# Patient Record
Sex: Female | Born: 1990
Health system: Southern US, Community
[De-identification: ages and names within clinical notes are randomized; demographics above are authoritative.]

## PROBLEM LIST (undated history)

## (undated) DIAGNOSIS — IMO0001 Reserved for inherently not codable concepts without codable children: Secondary | ICD-10-CM

## (undated) DIAGNOSIS — K219 Gastro-esophageal reflux disease without esophagitis: Secondary | ICD-10-CM

## (undated) HISTORY — DX: Gastro-esophageal reflux disease without esophagitis: K21.9

## (undated) HISTORY — DX: Reserved for inherently not codable concepts without codable children: IMO0001

---
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
## 2012-05-16 ENCOUNTER — Encounter: Payer: Self-pay | Admitting: Family Medicine

## 2012-05-16 ENCOUNTER — Ambulatory Visit (INDEPENDENT_AMBULATORY_CARE_PROVIDER_SITE_OTHER): Payer: Federal, State, Local not specified - PPO | Admitting: Family Medicine

## 2012-05-16 VITALS — BP 94/70 | HR 88 | Temp 98.8°F | Ht 62.5 in | Wt 147.0 lb

## 2012-05-16 DIAGNOSIS — R209 Unspecified disturbances of skin sensation: Secondary | ICD-10-CM

## 2012-05-16 DIAGNOSIS — R202 Paresthesia of skin: Secondary | ICD-10-CM

## 2012-05-16 NOTE — Progress Notes (Signed)
L arm sx.  Started about 1 month ago, initially intermittent.  Tingling in the arm, occ in the entire arm, or the forearm, or the upper arm in isolation.  Would last a few minutes.  Has been happening more often recently.  Noted a 'knot' on the L forearm yesterday, resolved today.  No weakness. No rash.  L handed.  No change in usual activities.  Out of school for summer break, but no clear trigger known for sx. "It can feel like it went to sleep."  Pins and needles sensation.    No L leg sx.  No facial sx. No speech changes.  Hands don't feel clumsy.  No FCNAVD.  Feels well o/w.  No neck pain.   Meds, vitals, and allergies reviewed.   ROS: See HPI.  Otherwise, noncontributory.  Nad ncat Neck supple, spurling neg B Normal rom at the shoulders, elbows, wrists CN 2-12 wnl B, S/S/DTR wnl x4 phalen and tinel neg No rash Normal inspection of the L arm and hand

## 2012-05-16 NOTE — Patient Instructions (Addendum)
Get an over the counter 'cock up' wrist brace and wear it at work and at night.  This should get better.  When it does, gradually wear the brace less.  If not better, notify us.

## 2012-05-16 NOTE — Assessment & Plan Note (Signed)
L handed.  Possible carpal tunnel symptoms, neg phalen and tinel could be due to mild/early symptoms.  Nontoxic, no alarming sx on exam.  Would use cock up brace prn and follow clinically.  D/w pt.  She agrees.  Call back or f/u prn.

## 2012-07-02 ENCOUNTER — Telehealth: Payer: Self-pay | Admitting: Family Medicine

## 2012-07-02 NOTE — Telephone Encounter (Signed)
Patient needs a wellness form to be filled out for foster care.  Patient needs to be seen before the end of September to have the form filled out and your next available for a cpx is in January.  Can patient be seen sooner?

## 2012-07-03 NOTE — Telephone Encounter (Signed)
yes

## 2012-07-17 ENCOUNTER — Encounter: Payer: Self-pay | Admitting: Family Medicine

## 2012-07-17 ENCOUNTER — Ambulatory Visit (INDEPENDENT_AMBULATORY_CARE_PROVIDER_SITE_OTHER): Payer: Federal, State, Local not specified - PPO | Admitting: Family Medicine

## 2012-07-17 VITALS — BP 119/75 | HR 93 | Temp 98.4°F | Ht 62.0 in | Wt 152.0 lb

## 2012-07-17 DIAGNOSIS — Z01419 Encounter for gynecological examination (general) (routine) without abnormal findings: Secondary | ICD-10-CM

## 2012-07-17 DIAGNOSIS — Z23 Encounter for immunization: Secondary | ICD-10-CM

## 2012-07-17 DIAGNOSIS — Z113 Encounter for screening for infections with a predominantly sexual mode of transmission: Secondary | ICD-10-CM

## 2012-07-17 DIAGNOSIS — N920 Excessive and frequent menstruation with regular cycle: Secondary | ICD-10-CM

## 2012-07-17 DIAGNOSIS — Z0000 Encounter for general adult medical examination without abnormal findings: Secondary | ICD-10-CM

## 2012-07-17 LAB — CBC WITH DIFFERENTIAL/PLATELET
Basophils Relative: 0.7 % (ref 0.0–3.0)
Eosinophils Relative: 3.8 % (ref 0.0–5.0)
Lymphocytes Relative: 47.6 % — ABNORMAL HIGH (ref 12.0–46.0)
MCV: 77.9 fl — ABNORMAL LOW (ref 78.0–100.0)
Monocytes Relative: 8.2 % (ref 3.0–12.0)
Neutrophils Relative %: 39.7 % — ABNORMAL LOW (ref 43.0–77.0)
RBC: 4.3 Mil/uL (ref 3.87–5.11)
WBC: 4.9 10*3/uL (ref 4.5–10.5)

## 2012-07-17 MED ORDER — OMEPRAZOLE 20 MG PO CPDR: 20.0000 mg | DELAYED_RELEASE_CAPSULE | Freq: Every day | ORAL | Status: DC

## 2012-07-17 NOTE — Progress Notes (Signed)
Subjective:    Patient ID: Rebecca Rollins, female    DOB: December 24, 1990, 21 y.o.   MRN: [id number]  HPI  The patient is here for annual wellness exam and preventative care.    Doing well overall.  GERD:  She has been off omeprazole because was out. She states only having mild symptoms depending on what she eats.   Review of Systems  Constitutional: Negative for fever, fatigue and unexpected weight change.  HENT: Negative for ear pain, congestion, sore throat, sneezing, trouble swallowing and sinus pressure.   Eyes: Negative for pain and itching.  Respiratory: Negative for cough, shortness of breath and wheezing.   Cardiovascular: Negative for chest pain, palpitations and leg swelling.  Gastrointestinal: Negative for nausea, abdominal pain, diarrhea, constipation and blood in stool.  Genitourinary: Positive for menstrual problem. Negative for dysuria, hematuria, vaginal discharge and difficulty urinating.       Cramping and fairly heavy flows  Skin: Negative for rash.  Neurological: Negative for syncope, weakness, light-headedness, numbness and headaches.  Psychiatric/Behavioral: Negative for confusion and dysphoric mood. The patient is not nervous/anxious.        Objective:   Physical Exam  Constitutional: Vital signs are normal. She appears well-developed and well-nourished. She is cooperative.  Non-toxic appearance. She does not appear ill. No distress.  HENT:  Head: Normocephalic.  Right Ear: Hearing, tympanic membrane, external ear and ear canal normal.  Left Ear: Hearing, tympanic membrane, external ear and ear canal normal.  Nose: Nose normal.  Eyes: Conjunctivae normal, EOM and lids are normal. Pupils are equal, round, and reactive to light. No foreign bodies found.  Neck: Trachea normal and normal range of motion. Neck supple. Carotid bruit is not present. No mass and no thyromegaly present.  Cardiovascular: Normal rate, regular rhythm, S1 normal, S2 normal, normal heart  sounds and intact distal pulses.  Exam reveals no gallop.   No murmur heard. Pulmonary/Chest: Effort normal and breath sounds normal. No respiratory distress. She has no wheezes. She has no rhonchi. She has no rales.  Abdominal: Soft. Normal appearance and bowel sounds are normal. She exhibits no distension, no fluid wave, no abdominal bruit and no mass. There is no hepatosplenomegaly. There is no tenderness. There is no rebound, no guarding and no CVA tenderness. No hernia.  Genitourinary: Vagina normal and uterus normal. No breast swelling, tenderness, discharge or bleeding. Pelvic exam was performed with patient prone. There is no rash, tenderness or lesion on the right labia. There is no rash, tenderness or lesion on the left labia. Uterus is not enlarged and not tender. Cervix exhibits no motion tenderness, no discharge and no friability. Right adnexum displays no mass, no tenderness and no fullness. Left adnexum displays no mass, no tenderness and no fullness.       No pap  GC/Chlam probe.  Lymphadenopathy:    She has no cervical adenopathy.    She has no axillary adenopathy.  Neurological: She is alert. She has normal strength. No cranial nerve deficit or sensory deficit.  Skin: Skin is warm, dry and intact. No rash noted.  Psychiatric: Her speech is normal and behavior is normal. Judgment normal. Her mood appears not anxious. Cognition and memory are normal. She does not exhibit a depressed mood.          Assessment & Plan:  The patient's preventative maintenance and recommended screening tests for an annual wellness exam were reviewed in full today. Brought up to date unless services declined.  Counselled on the  importance of diet, exercise, and its role in overall health and mortality. The patient's FH and SH was reviewed, including their home life, tobacco status, and drug and alcohol status.   Vaccines: flu today, will consider gardasil. Uptodate with TD  Will return for  ppd. STD testing today, no pap indicated.

## 2012-07-17 NOTE — Patient Instructions (Addendum)
Avoid reflux triggers. Restart omeprazole daily if symptoms recur more than 2-3 times a week.  Stop at lab on your way out.. We will call with the results.  Make nurse visit appt for PPD. Look into Gardasil vaccine coverage.. Can return for this in next week as well.  Flu shot given today.

## 2012-07-18 LAB — GC/CHLAMYDIA PROBE AMP, GENITAL
Chlamydia, DNA Probe: NEGATIVE
GC Probe Amp, Genital: NEGATIVE

## 2012-07-18 LAB — HIV ANTIBODY (ROUTINE TESTING W REFLEX): HIV: NONREACTIVE

## 2012-11-11 ENCOUNTER — Ambulatory Visit (INDEPENDENT_AMBULATORY_CARE_PROVIDER_SITE_OTHER): Payer: Federal, State, Local not specified - PPO | Admitting: *Deleted

## 2012-11-11 DIAGNOSIS — Z111 Encounter for screening for respiratory tuberculosis: Secondary | ICD-10-CM

## 2012-11-13 LAB — TB SKIN TEST: TB Skin Test: NEGATIVE

## 2013-01-16 ENCOUNTER — Ambulatory Visit: Payer: Federal, State, Local not specified - PPO | Admitting: Family Medicine

## 2013-01-22 ENCOUNTER — Telehealth: Payer: Self-pay

## 2013-01-22 ENCOUNTER — Encounter: Payer: Self-pay | Admitting: *Deleted

## 2013-01-22 ENCOUNTER — Ambulatory Visit (INDEPENDENT_AMBULATORY_CARE_PROVIDER_SITE_OTHER): Payer: Federal, State, Local not specified - PPO | Admitting: Family Medicine

## 2013-01-22 ENCOUNTER — Encounter: Payer: Self-pay | Admitting: Family Medicine

## 2013-01-22 VITALS — BP 100/60 | HR 96 | Temp 103.1°F | Ht 62.0 in | Wt 156.5 lb

## 2013-01-22 DIAGNOSIS — J020 Streptococcal pharyngitis: Secondary | ICD-10-CM

## 2013-01-22 DIAGNOSIS — J029 Acute pharyngitis, unspecified: Secondary | ICD-10-CM

## 2013-01-22 LAB — POCT RAPID STREP A (OFFICE): Rapid Strep A Screen: POSITIVE — AB

## 2013-01-22 MED ORDER — AZITHROMYCIN 250 MG PO TABS: ORAL_TABLET | ORAL | Status: DC

## 2013-01-22 NOTE — Progress Notes (Signed)
Nature conservation officer at [facility] [address] [place] [address] Phone: [phone] Fax: [phone]  Date:  01/22/2013   Name:  Rebecca Rollins   DOB:  08-17-1991   MRN:  [id number] Gender: female Age: 22 y.o.  Primary Physician:  Kerby Nora, MD  Evaluating MD: Hannah Beat, MD   Chief Complaint: Sore Throat   History of Present Illness:  Rebecca Rollins is a 22 y.o. pleasant patient who presents with the following:  Strep + test  Sore throat for several days, now severe tiredness, sore throat, headache, sore neck. Does not want to eat or drink. Aches and feels pretty terrible overall.  Also have a question about R side of face if swollen?  Patient Active Problem List  Diagnosis  . GERD  . DYSMENORRHEA  . Vaginitis due to Candida  . Bacterial vaginosis  . Paresthesia    Past Medical History  Diagnosis Date  . Reflux     Past Surgical History  Procedure Laterality Date  . Mouth surgery  2011    History   Social History  . Marital Status: Single    Spouse Name: N/A    Number of Children: N/A  . Years of Education: N/A   Occupational History  . Not on file.   Social History Main Topics  . Smoking status: Never Smoker   . Smokeless tobacco: Never Used  . Alcohol Use: Yes     Comment: occasinally, 2-3 drinks once every 6 months  . Drug Use: No  . Sexually Active: Yes    Birth Control/ Protection: Condom   Other Topics Concern  . Not on file   Social History Narrative   11th grade at [employer]   As and Bs   Plans on college   Diet: some fruits and veggies   No sports   Works at [employer]   Exercise: only in gym   [employer] and phone   Sexually active...first at 28    Family History  Problem Relation Age of Onset  . Hypertension Mother   . Anemia Mother   . Diabetes Maternal Uncle   . Hypertension Maternal Grandmother   . Hypertension Maternal Grandfather     Allergies  Allergen Reactions  . Penicillins    REACTION: ? Rash    Medication list has been reviewed and updated.  Outpatient Prescriptions Prior to Visit  Medication Sig Dispense Refill  . omeprazole (PRILOSEC) 20 MG capsule Take 1 capsule (20 mg total) by mouth daily.  30 capsule  3   No facility-administered medications prior to visit.    Review of Systems:  ROS: GEN: Acute illness details above GI: Tolerating PO intake, decreased GU: maintaining adequate hydration and urination Pulm: No SOB Interactive and getting along well at home.  Otherwise, ROS is as per the HPI.   Physical Examination: BP 100/60  Pulse 96  Temp(Src) 103.1 F (39.5 C) (Oral)  Ht 5\' 2"  (1.575 m)  Wt 156 lb 8 oz (70.988 kg)  BMI 28.62 kg/m2  Ideal Body Weight: Weight in (lb) to have BMI = 25: 136.4   Gen: WDWN, NAD; A & O x3, cooperative. Pleasant.Globally Non-toxic HEENT: Normocephalic and atraumatic. Throat: swollen tonsills without exudate R TM clear, L TM - good landmarks, No fluid present. rhinnorhea. No frontal or maxillary sinus T. MMM NECK: Anterior cervical  LAD is present - TTP CV: RRR, No M/G/R, cap refill <2 sec PULM: Breathing comfortably in no respiratory distress. no  wheezing, crackles, rhonchi ABD: S,NT,ND,+BS. No HSM. No rebound. EXT: No c/c/e PSYCH: Friendly, good eye contact   Assessment and Plan: Streptococcal sore throat  Sore throat - Plan: POCT rapid strep A   Zpak in a PCN allergic patient   R side of face seems roughly same to me, maybe slight enlargement, but nothing palpable. Parotids and submental glands normal, no LAD. I tried to reassured them.   Results for orders placed in visit on 01/22/13  POCT RAPID STREP A (OFFICE)      Result Value Range   Rapid Strep A Screen Positive (*) Negative     Signed, Gatsby Chismar T. Bernard Slayden, MD 01/22/2013 3:56 PM

## 2013-01-22 NOTE — Telephone Encounter (Signed)
Pt's mother said 1 AM this morning pt had fever 103, took Ibuprofen; temp is down but pts throat is extremely sore and tonsils are swollen. Pt not having difficulty breathing or swallowing. Pt's mother thinks pt needs to be seen today. Scheduled appt with Dr Patsy Lager today at 3:45 pm. If pts condition changes or worsens pt's mother will call back.

## 2013-01-23 ENCOUNTER — Ambulatory Visit: Payer: Federal, State, Local not specified - PPO | Admitting: Family Medicine

## 2013-03-13 ENCOUNTER — Ambulatory Visit: Payer: Federal, State, Local not specified - PPO | Admitting: Family Medicine

## 2013-03-17 ENCOUNTER — Ambulatory Visit (INDEPENDENT_AMBULATORY_CARE_PROVIDER_SITE_OTHER): Payer: Federal, State, Local not specified - PPO | Admitting: Family Medicine

## 2013-03-17 ENCOUNTER — Encounter: Payer: Self-pay | Admitting: Family Medicine

## 2013-03-17 VITALS — BP 100/60 | HR 88 | Temp 98.3°F | Ht 62.0 in | Wt 161.1 lb

## 2013-03-17 DIAGNOSIS — R220 Localized swelling, mass and lump, head: Secondary | ICD-10-CM | POA: Insufficient documentation

## 2013-03-17 DIAGNOSIS — L700 Acne vulgaris: Secondary | ICD-10-CM | POA: Insufficient documentation

## 2013-03-17 DIAGNOSIS — L708 Other acne: Secondary | ICD-10-CM

## 2013-03-17 DIAGNOSIS — M26609 Unspecified temporomandibular joint disorder, unspecified side: Secondary | ICD-10-CM

## 2013-03-17 MED ORDER — TRETINOIN MICROSPHERE 0.1 % EX GEL: Freq: Every day | CUTANEOUS | Status: DC

## 2013-03-17 NOTE — Assessment & Plan Note (Signed)
No red flags. Nml thyroid, no enlarged lymph nodes. No oral issues seen. No cyst.  Appears consistent with larger masseter muscle on right.   Pt reassured.

## 2013-03-17 NOTE — Patient Instructions (Signed)
Area of jaw swelling is not a lymph node or the thyroid. Simply appears liek larger jaw muscle on that side from possible tooth grinding or uneven jaw use.  Trial of retin A cream for acne  Stop at front desk to set up referral to Dermatologist.

## 2013-03-17 NOTE — Assessment & Plan Note (Signed)
Refer to derm. Ttrial of retin A to help with acne and lighten dark spots.

## 2013-03-17 NOTE — Progress Notes (Signed)
  Subjective:    Patient ID: Rebecca Rollins, female    DOB: 1991-08-28, 22 y.o.   MRN: [id number]  HPI  22 year old female presents with a knot on right jaw-line... It has been there for years... Mother feels like it may be getting bigger in last year. No pain in area, has acne... Had root canal 2-3 years ago, no recent tooth infections. Dentist felt it was not worrisome. No other concerns... Good energy . No fever, no night sweats, no unexpected weight loss.  No tobacco use.  She wants a referral to dermatologist for acne and dark spots on face.   Review of Systems  Constitutional: Negative for fever and fatigue.  HENT: Negative for nosebleeds, congestion and sneezing.   Eyes: Negative for pain.  Respiratory: Negative for cough and shortness of breath.   Cardiovascular: Negative for chest pain.  Gastrointestinal: Negative for abdominal pain.  Neurological: Negative for headaches.       Objective:   Physical Exam  Constitutional: Vital signs are normal. She appears well-developed and well-nourished. She is cooperative.  Non-toxic appearance. She does not appear ill. No distress.  HENT:  Head: Normocephalic.  Right Ear: Hearing, tympanic membrane, external ear and ear canal normal. Tympanic membrane is not erythematous, not retracted and not bulging.  Left Ear: Hearing, tympanic membrane, external ear and ear canal normal. Tympanic membrane is not erythematous, not retracted and not bulging.  Nose: No mucosal edema or rhinorrhea. Right sinus exhibits no maxillary sinus tenderness and no frontal sinus tenderness. Left sinus exhibits no maxillary sinus tenderness and no frontal sinus tenderness.  Mouth/Throat: Uvula is midline, oropharynx is clear and moist and mucous membranes are normal. No oral lesions. Normal dentition. No dental abscesses, edematous or dental caries. No oropharyngeal exudate, posterior oropharyngeal edema, posterior oropharyngeal erythema or tonsillar abscesses.   Right masseter muscle enlarged.. Non tender  o parotid swelling  Eyes: Conjunctivae, EOM and lids are normal. Pupils are equal, round, and reactive to light. No foreign bodies found.  Neck: Trachea normal and normal range of motion. Neck supple. Carotid bruit is not present. No mass and no thyromegaly present.  Cardiovascular: Normal rate, regular rhythm, S1 normal, S2 normal, normal heart sounds, intact distal pulses and normal pulses.  Exam reveals no gallop and no friction rub.   No murmur heard. Pulmonary/Chest: Effort normal and breath sounds normal. Not tachypneic. No respiratory distress. She has no decreased breath sounds. She has no wheezes. She has no rhonchi. She has no rales.  Abdominal: Soft. Normal appearance and bowel sounds are normal. There is no tenderness.  Neurological: She is alert.  Skin: Skin is warm, dry and intact. No rash noted.  Erythematous pustules and closed comedones and dark spots on cheeks and forehead.  Psychiatric: Her speech is normal and behavior is normal. Judgment and thought content normal. Her mood appears not anxious. Cognition and memory are normal. She does not exhibit a depressed mood.          Assessment & Plan:

## 2013-05-21 ENCOUNTER — Ambulatory Visit: Payer: Federal, State, Local not specified - PPO | Admitting: Family Medicine

## 2013-05-21 DIAGNOSIS — Z0289 Encounter for other administrative examinations: Secondary | ICD-10-CM

## 2013-09-21 ENCOUNTER — Encounter: Payer: Self-pay | Admitting: Family Medicine

## 2013-09-21 ENCOUNTER — Ambulatory Visit (INDEPENDENT_AMBULATORY_CARE_PROVIDER_SITE_OTHER): Payer: Federal, State, Local not specified - PPO | Admitting: Family Medicine

## 2013-09-21 VITALS — BP 100/70 | HR 84 | Temp 98.7°F | Ht 62.0 in | Wt 159.5 lb

## 2013-09-21 DIAGNOSIS — Z23 Encounter for immunization: Secondary | ICD-10-CM

## 2013-09-21 DIAGNOSIS — Z0000 Encounter for general adult medical examination without abnormal findings: Secondary | ICD-10-CM

## 2013-09-21 DIAGNOSIS — Z113 Encounter for screening for infections with a predominantly sexual mode of transmission: Secondary | ICD-10-CM

## 2013-09-21 DIAGNOSIS — Z309 Encounter for contraceptive management, unspecified: Secondary | ICD-10-CM

## 2013-09-21 DIAGNOSIS — Z111 Encounter for screening for respiratory tuberculosis: Secondary | ICD-10-CM

## 2013-09-21 NOTE — Progress Notes (Signed)
Pre-visit discussion using our clinic review tool. No additional management support is needed unless otherwise documented below in the visit note.  

## 2013-09-21 NOTE — Patient Instructions (Addendum)
Return in 2 weeks for repeat Urine preg and initiation of Depo injections. Stop at lab on way out. We will call with results in few days. Work on [employer] and regular exercise.

## 2013-09-21 NOTE — Progress Notes (Signed)
HPI  The patient is here for annual wellness exam and preventative care.  Doing well overall.  Diet: poor diet, was doing better over the summer. Exercise: none Wt Readings from Last 3 Encounters:  09/21/13 159 lb 8 oz (72.349 kg)  03/17/13 161 lb 1.9 oz (73.084 kg)  01/22/13 156 lb 8 oz (70.988 kg)     GERD:  She states only having mild symptoms depending on what she eats.  Not hapenning often.  She is interested in Depo shot for contraception.  LMP 08/26/2013 She will move ahead with Upreg today to prepare if she decides to start depo. Review of Systems  Constitutional: Negative for fever, fatigue and unexpected weight change.  HENT: Negative for ear pain, congestion, sore throat, sneezing, trouble swallowing and sinus pressure.  Eyes: Negative for pain and itching.  Respiratory: Negative for cough, shortness of breath and wheezing.  Cardiovascular: Negative for chest pain, palpitations and leg swelling.  Gastrointestinal: Negative for nausea, abdominal pain, diarrhea, constipation and blood in stool.  Genitourinary: Positive for menstrual problem. Negative for dysuria, hematuria, vaginal discharge and difficulty urinating.  Cramping and fairly heavy flows  Skin: Negative for rash.  Neurological: Negative for syncope, weakness, light-headedness, numbness and headaches.  Psychiatric/Behavioral: Negative for confusion and dysphoric mood. The patient is not nervous/anxious.  Objective:   Physical Exam  Constitutional: Vital signs are normal. She appears well-developed and well-nourished. She is cooperative. Non-toxic appearance. She does not appear ill. No distress.  HENT:  Head: Normocephalic.  Right Ear: Hearing, tympanic membrane, external ear and ear canal normal.  Left Ear: Hearing, tympanic membrane, external ear and ear canal normal.  Nose: Nose normal.  Eyes: Conjunctivae normal, EOM and lids are normal. Pupils are equal, round, and reactive to light. No foreign bodies  found.  Neck: Trachea normal and normal range of motion. Neck supple. Carotid bruit is not present. No mass and no thyromegaly present.  Cardiovascular: Normal rate, regular rhythm, S1 normal, S2 normal, normal heart sounds and intact distal pulses. Exam reveals no gallop.  No murmur heard.  Pulmonary/Chest: Effort normal and breath sounds normal. No respiratory distress. She has no wheezes. She has no rhonchi. She has no rales.  Abdominal: Soft. Normal appearance and bowel sounds are normal. She exhibits no distension, no fluid wave, no abdominal bruit and no mass. There is no hepatosplenomegaly. There is no tenderness. There is no rebound, no guarding and no CVA tenderness. No hernia.  Genitourinary: Vagina normal and uterus normal. No breast swelling, tenderness, discharge or bleeding. Pelvic exam was performed with patient prone. There is no rash, tenderness or lesion on the right labia. There is no rash, tenderness or lesion on the left labia. Uterus is not enlarged and not tender. Cervix exhibits no motion tenderness, no discharge and no friability. Right adnexum displays no mass, no tenderness and no fullness. Left adnexum displays no mass, no tenderness and no fullness.  No pap GC/Chlam probe.  Lymphadenopathy:  She has no cervical adenopathy.  She has no axillary adenopathy.  Neurological: She is alert. She has normal strength. No cranial nerve deficit or sensory deficit.  Skin: Skin is warm, dry and intact. No rash noted.  Psychiatric: Her speech is normal and behavior is normal. Judgment normal. Her mood appears not anxious. Cognition and memory are normal. She does not exhibit a depressed mood.  Assessment & Plan:   The patient's preventative maintenance and recommended screening tests for an annual wellness exam were reviewed in full today.  Brought up to date unless services declined.  Counselled on the importance of diet, exercise, and its role in overall health and mortality.  The  patient's FH and SH was reviewed, including their home life, tobacco status, and drug and alcohol status.   Vaccines: flu today, will consider gardasil. Uptodate with TD  STD testing today, no pap indicated.

## 2013-09-22 LAB — HEPATITIS PANEL, ACUTE
HCV Ab: NEGATIVE
Hep A IgM: NONREACTIVE
Hep B C IgM: NONREACTIVE
Hepatitis B Surface Ag: NEGATIVE

## 2013-09-22 LAB — HIV ANTIBODY (ROUTINE TESTING W REFLEX): HIV: NONREACTIVE

## 2013-11-20 ENCOUNTER — Ambulatory Visit (INDEPENDENT_AMBULATORY_CARE_PROVIDER_SITE_OTHER): Payer: Federal, State, Local not specified - PPO | Admitting: Family Medicine

## 2013-11-20 ENCOUNTER — Encounter: Payer: Self-pay | Admitting: Family Medicine

## 2013-11-20 VITALS — BP 110/60 | HR 100 | Temp 98.8°F | Ht 62.0 in | Wt 161.5 lb

## 2013-11-20 DIAGNOSIS — N920 Excessive and frequent menstruation with regular cycle: Secondary | ICD-10-CM | POA: Insufficient documentation

## 2013-11-20 LAB — COMPREHENSIVE METABOLIC PANEL
ALT: 9 U/L (ref 0–35)
AST: 17 U/L (ref 0–37)
Albumin: 4 g/dL (ref 3.5–5.2)
Alkaline Phosphatase: 71 U/L (ref 39–117)
BUN: 6 mg/dL (ref 6–23)
CHLORIDE: 103 meq/L (ref 96–112)
CO2: 29 mEq/L (ref 19–32)
CREATININE: 0.69 mg/dL (ref 0.50–1.10)
Calcium: 9.3 mg/dL (ref 8.4–10.5)
Glucose, Bld: 74 mg/dL (ref 70–99)
Potassium: 4.4 mEq/L (ref 3.5–5.3)
SODIUM: 137 meq/L (ref 135–145)
Total Bilirubin: 0.2 mg/dL — ABNORMAL LOW (ref 0.3–1.2)
Total Protein: 7.2 g/dL (ref 6.0–8.3)

## 2013-11-20 LAB — CBC WITH DIFFERENTIAL/PLATELET
Basophils Absolute: 0 10*3/uL (ref 0.0–0.1)
Basophils Relative: 0 % (ref 0–1)
Eosinophils Absolute: 0.2 10*3/uL (ref 0.0–0.7)
Eosinophils Relative: 3 % (ref 0–5)
HCT: 31.9 % — ABNORMAL LOW (ref 36.0–46.0)
Hemoglobin: 10 g/dL — ABNORMAL LOW (ref 12.0–15.0)
LYMPHS ABS: 3 10*3/uL (ref 0.7–4.0)
Lymphocytes Relative: 47 % — ABNORMAL HIGH (ref 12–46)
MCH: 24.3 pg — ABNORMAL LOW (ref 26.0–34.0)
MCHC: 31.3 g/dL (ref 30.0–36.0)
MCV: 77.6 fL — ABNORMAL LOW (ref 78.0–100.0)
Monocytes Absolute: 0.6 10*3/uL (ref 0.1–1.0)
Monocytes Relative: 10 % (ref 3–12)
NEUTROS ABS: 2.6 10*3/uL (ref 1.7–7.7)
NEUTROS PCT: 40 % — AB (ref 43–77)
PLATELETS: 390 10*3/uL (ref 150–400)
RBC: 4.11 MIL/uL (ref 3.87–5.11)
RDW: 16.6 % — ABNORMAL HIGH (ref 11.5–15.5)
WBC: 6.4 10*3/uL (ref 4.0–10.5)

## 2013-11-20 LAB — POCT URINE PREGNANCY: Preg Test, Ur: NEGATIVE

## 2013-11-20 MED ORDER — NORGESTIMATE-ETH ESTRADIOL 0.25-35 MG-MCG PO TABS: ORAL_TABLET | ORAL | Status: DC

## 2013-11-20 NOTE — Assessment & Plan Note (Signed)
Eval with labs. Neg U preg, doubt pregnancy.  Nml ovulation.  Menses has diminished some in last 24 hours.. If bleeding not stopped if 48 hours.. Treat with OCPs 3/2/1 tabs then for 1 week then stop for withdrawal bleed. Discussed continuing OCPs or depo and she will consider. If this becomes a persistent issue.. We will eval with US.

## 2013-11-20 NOTE — Progress Notes (Signed)
Pre-visit discussion using our clinic review tool. No additional management support is needed unless otherwise documented below in the visit note.  

## 2013-11-20 NOTE — Progress Notes (Signed)
   Subjective:    Patient ID: Rebecca Rollins, female    DOB: May 29, 1991, 23 y.o.   MRN: [id number]  HPI   23 year old female  presents with changes in her menstrual cycle. She has noted that her menses started 1-2 weeks earlier than usual.  She is having heavier bleeding than usual, blood clots, using a new tampon every 1-2 hours. Her menses has been continuing now for 12 days straight.  Minimal abdominal cramping.  No lightheadedness/ dizziness, she has been having some headaches, she has been more fatigued but poor sleep.  Prior to last cycle her menses has been very regular.  Last sexual activity was 09/2013.  HR is slightly up today at 100.  BP Readings from Last 3 Encounters:  11/20/13 110/60  09/21/13 100/70  03/17/13 100/60        Review of Systems  Constitutional: Negative for fever and fatigue.  HENT: Negative for ear pain.   Eyes: Negative for pain.  Respiratory: Negative for chest tightness and shortness of breath.   Cardiovascular: Negative for chest pain, palpitations and leg swelling.  Gastrointestinal: Negative for abdominal pain.  Genitourinary: Negative for dysuria.       Objective:   Physical Exam  Constitutional: Vital signs are normal. She appears well-developed and well-nourished. She is cooperative.  Non-toxic appearance. She does not appear ill. No distress.  HENT:  Head: Normocephalic.  Right Ear: Hearing, tympanic membrane, external ear and ear canal normal. Tympanic membrane is not erythematous, not retracted and not bulging.  Left Ear: Hearing, tympanic membrane, external ear and ear canal normal. Tympanic membrane is not erythematous, not retracted and not bulging.  Nose: No mucosal edema or rhinorrhea. Right sinus exhibits no maxillary sinus tenderness and no frontal sinus tenderness. Left sinus exhibits no maxillary sinus tenderness and no frontal sinus tenderness.  Mouth/Throat: Uvula is midline, oropharynx is clear and moist and mucous  membranes are normal.  Eyes: Conjunctivae, EOM and lids are normal. Pupils are equal, round, and reactive to light. Lids are everted and swept, no foreign bodies found.  Neck: Trachea normal and normal range of motion. Neck supple. Carotid bruit is not present. No mass and no thyromegaly present.  Cardiovascular: Normal rate, regular rhythm, S1 normal, S2 normal, normal heart sounds, intact distal pulses and normal pulses.  Exam reveals no gallop and no friction rub.   No murmur heard. Pulmonary/Chest: Effort normal and breath sounds normal. Not tachypneic. No respiratory distress. She has no decreased breath sounds. She has no wheezes. She has no rhonchi. She has no rales.  Abdominal: Soft. Normal appearance and bowel sounds are normal. There is no tenderness.  Neurological: She is alert.  Skin: Skin is warm, dry and intact. No rash noted.  Psychiatric: Her speech is normal and behavior is normal. Judgment and thought content normal. Her mood appears not anxious. Cognition and memory are normal. She does not exhibit a depressed mood.          Assessment & Plan:

## 2013-11-20 NOTE — Patient Instructions (Signed)
Stop at the lab on your way out.  If menses stop in next 48 hours, good.  If not you can fill the prescription for taper of OCPs.  Call if heavy periods return for further imaging or consideration of DEPO or other birth control.

## 2013-11-21 LAB — TSH: TSH: 0.508 u[IU]/mL (ref 0.350–4.500)

## 2014-06-03 ENCOUNTER — Ambulatory Visit (INDEPENDENT_AMBULATORY_CARE_PROVIDER_SITE_OTHER): Payer: Federal, State, Local not specified - PPO | Admitting: Internal Medicine

## 2014-06-03 ENCOUNTER — Encounter: Payer: Self-pay | Admitting: Internal Medicine

## 2014-06-03 VITALS — BP 106/64 | HR 97 | Temp 98.6°F | Wt 158.0 lb

## 2014-06-03 DIAGNOSIS — Z113 Encounter for screening for infections with a predominantly sexual mode of transmission: Secondary | ICD-10-CM

## 2014-06-03 NOTE — Patient Instructions (Addendum)

## 2014-06-03 NOTE — Progress Notes (Signed)
Subjective:    Patient ID: Rebecca Rollins, female    DOB: [birth year], 23 y.o.   MRN: [name]  HPI  Pt presents to the clinic today for STD check. She reports she has no vaginal discharge, odor or pain. She reports that she is not sexually active. She would just like to be screened.  Review of Systems      Past Medical History  Diagnosis Date  . Reflux     No current outpatient prescriptions on file.   No current facility-administered medications for this visit.    Allergies  Allergen Reactions  . Penicillins     REACTION: ? Rash    Family History  Problem Relation Age of Onset  . Hypertension Mother   . Anemia Mother   . Diabetes Maternal Uncle   . Hypertension Maternal Grandmother   . Hypertension Maternal Grandfather     History   Social History  . Marital Status: Single    Spouse Name: N/A    Number of Children: N/A  . Years of Education: N/A   Occupational History  . Not on file.   Social History Main Topics  . Smoking status: Never Smoker   . Smokeless tobacco: Never Used  . Alcohol Use: Yes     Comment: occasinally, 2-3 drinks once every 6 months  . Drug Use: No  . Sexual Activity: Yes    Birth Control/ Protection: Condom   Other Topics Concern  . Not on file   Social History Narrative   11th grade at [employer]   As and Bs   Plans on college   Diet: some fruits and veggies   No sports   Works at [employer]   Exercise: only in gym   [employer] and phone   Sexually active...first at 17     Constitutional: Denies fever, malaise, fatigue, headache or abrupt weight changes.  GU: Denies urgency, frequency, pain with urination, burning sensation, blood in urine, odor or discharge.   No other specific complaints in a complete review of systems (except as listed in HPI above).  Objective:   Physical Exam   BP 106/64  Pulse 97  Temp(Src) 98.6 F (37 C) (Oral)  Wt 158 lb (71.668 kg)  SpO2 99%  LMP 05/25/2014 Wt Readings  from Last 3 Encounters:  06/03/14 158 lb (71.668 kg)  11/20/13 161 lb 8 oz (73.256 kg)  09/21/13 159 lb 8 oz (72.349 kg)    General: Appears her stated age, well developed, well nourished in NAD. Cardiovascular: Normal rate and rhythm. S1,S2 noted.  No murmur, rubs or gallops noted. No JVD or BLE edema. No carotid bruits noted. Pulmonary/Chest: Normal effort and positive vesicular breath sounds. No respiratory distress. No wheezes, rales or ronchi noted.  Abdomen: Soft and nontender. Normal bowel sounds, no bruits noted. No distention or masses noted. Liver, spleen and kidneys non palpable.  BMET    Component Value Date/Time   NA 137 11/20/2013 1700   K 4.4 11/20/2013 1700   CL 103 11/20/2013 1700   CO2 29 11/20/2013 1700   GLUCOSE 74 11/20/2013 1700   BUN 6 11/20/2013 1700   CREATININE 0.69 11/20/2013 1700   CALCIUM 9.3 11/20/2013 1700    Lipid Panel  No results found for this basename: chol, trig, hdl, cholhdl, vldl, ldlcalc    CBC    Component Value Date/Time   WBC 6.4 11/20/2013 1700   RBC 4.11 11/20/2013 1700   HGB 10.0* 11/20/2013 1700  HCT 31.9* 11/20/2013 1700   PLT 390 11/20/2013 1700   MCV 77.6* 11/20/2013 1700   MCH 24.3* 11/20/2013 1700   MCHC 31.3 11/20/2013 1700   RDW 16.6* 11/20/2013 1700   LYMPHSABS 3.0 11/20/2013 1700   MONOABS 0.6 11/20/2013 1700   EOSABS 0.2 11/20/2013 1700   BASOSABS 0.0 11/20/2013 1700    Hgb A1C No results found for this basename: HGBA1C        Assessment & Plan:   Screening for STD:  Will check blood for HSV, Hep C, HIV and RPR Urine G/C She declines pap smear today to check for cervical cancer/trichomonas  Will call you with the lab results

## 2014-06-03 NOTE — Progress Notes (Signed)
Pre visit review using our clinic review tool, if applicable. No additional management support is needed unless otherwise documented below in the visit note. 

## 2014-06-04 LAB — RPR

## 2014-06-04 LAB — HEPATITIS C ANTIBODY, REFLEX: HCV Ab: NEGATIVE

## 2014-06-04 LAB — GC/CHLAMYDIA PROBE AMP, URINE
Chlamydia, Swab/Urine, PCR: NEGATIVE
GC Probe Amp, Urine: NEGATIVE

## 2014-06-04 LAB — HIV ANTIBODY (ROUTINE TESTING W REFLEX): HIV: NONREACTIVE

## 2014-06-04 LAB — HSV(HERPES SMPLX)ABS-I+II(IGG+IGM)-BLD
HSV 1 Glycoprotein G Ab, IgG: 0.14 IV
HSV 2 Glycoprotein G Ab, IgG: 0.31 IV
Herpes Simplex Vrs I&II-IgM Ab (EIA): 1.12 INDEX — ABNORMAL HIGH

## 2014-08-09 ENCOUNTER — Ambulatory Visit (INDEPENDENT_AMBULATORY_CARE_PROVIDER_SITE_OTHER): Payer: Federal, State, Local not specified - PPO | Admitting: Family Medicine

## 2014-08-09 ENCOUNTER — Encounter: Payer: Self-pay | Admitting: Family Medicine

## 2014-08-09 VITALS — BP 100/60 | HR 88 | Temp 98.3°F | Ht 62.0 in | Wt 157.2 lb

## 2014-08-09 DIAGNOSIS — N760 Acute vaginitis: Secondary | ICD-10-CM

## 2014-08-09 DIAGNOSIS — M545 Low back pain, unspecified: Secondary | ICD-10-CM

## 2014-08-09 DIAGNOSIS — N898 Other specified noninflammatory disorders of vagina: Secondary | ICD-10-CM

## 2014-08-09 DIAGNOSIS — B9689 Other specified bacterial agents as the cause of diseases classified elsewhere: Secondary | ICD-10-CM

## 2014-08-09 DIAGNOSIS — A499 Bacterial infection, unspecified: Secondary | ICD-10-CM

## 2014-08-09 LAB — POCT URINALYSIS DIPSTICK
Bilirubin, UA: NEGATIVE
GLUCOSE UA: NEGATIVE
KETONES UA: NEGATIVE
Leukocytes, UA: NEGATIVE
Nitrite, UA: NEGATIVE
PROTEIN UA: NEGATIVE
RBC UA: NEGATIVE
Spec Grav, UA: 1.02
Urobilinogen, UA: 0.2
pH, UA: 6

## 2014-08-09 LAB — POCT WET PREP WITH KOH
RBC WET PREP PER HPF POC: NEGATIVE
Trichomonas, UA: NEGATIVE
YEAST WET PREP PER HPF POC: NEGATIVE

## 2014-08-09 MED ORDER — METRONIDAZOLE 500 MG PO TABS: 500.0000 mg | ORAL_TABLET | Freq: Two times a day (BID) | ORAL | Status: DC

## 2014-08-09 NOTE — Progress Notes (Signed)
Dr. Karleen HampshireSpencer T. Jaterrius Ricketson, MD, CAQ [facility] [address] [place], [address] Phone: [phone] Fax: [phone]  08/09/2014  Patient: Rebecca Rollins, MRN: [id number], DOB: 1991-06-06, 23 y.o.  Primary Physician:  Kerby NoraAmy Bedsole, MD  Chief Complaint: Vaginitis and Back Pain  Subjective:   Rebecca Rollins is a 23 y.o. very pleasant female patient who presents with the following:  Midline low back pain without any kind of trauma that has been ongoing for one week. The patient has tried some moist heat and a heating pad which is helped a little bit. She is not having any radiculopathy. No numbness or tingling. No prior history of back pain. She has never had any kind of prior operative interventions.  She is very active and still works full-time, and she is also in school full time.  She also is having some vaginal discharge and is not really hurting at all. She has not had any known STD exposure.  Past Medical History, Surgical History, Social History, Family History, Problem List, Medications, and Allergies have been reviewed and updated if relevant.   GEN: No acute illnesses, no fevers, chills. GI: No n/v/d, eating normally Pulm: No SOB Interactive and getting along well at home.  Otherwise, ROS is as per the HPI.  Objective:   BP 100/60  Pulse 88  Temp(Src) 98.3 F (36.8 C) (Oral)  Ht 5\' 2"  (1.575 m)  Wt 157 lb 4 oz (71.328 kg)  BMI 28.75 kg/m2  LMP 07/22/2014   GEN: Well-developed,well-nourished,in no acute distress; alert,appropriate and cooperative throughout examination HEENT: Normocephalic and atraumatic without obvious abnormalities. Ears, externally no deformities PULM: Breathing comfortably in no respiratory distress EXT: No clubbing, cyanosis, or edema PSYCH: Normally interactive. Cooperative during the interview. Pleasant. Friendly and conversant. Not anxious or depressed appearing. Normal, full affect.  Range of  motion at  the waist: Flexion: normal Extension: normal Lateral bending: normal Rotation: all normal  No echymosis or edema Rises to examination table with no difficulty Gait: non antalgic  Inspection/Deformity: N Paraspinus Tenderness: mild at l2-3  B Ankle Dorsiflexion (L5,4): 5/5 B Great Toe Dorsiflexion (L5,4): 5/5 Heel Walk (L5): WNL Toe Walk (S1): WNL Rise/Squat (L4): WNL  SENSORY B Medial Foot (L4): WNL B Dorsum (L5): WNL B Lateral (S1): WNL Light Touch: WNL Pinprick: WNL  REFLEXES Knee (L4): 2+ Ankle (S1): 2+  B SLR, seated: neg B SLR, supine: neg B FABER: neg B Reverse FABER: neg B Greater Troch: NT B Log Roll: neg B Stork: NT B Sciatic Notch: NT   Laboratory and Imaging Data: Results for orders placed in visit on 08/09/14  POCT WET PREP WITH KOH      Result Value Ref Range   Trichomonas, UA Negative     Clue Cells Wet Prep HPF POC Few     Epithelial Wet Prep HPF POC 5-10     Yeast Wet Prep HPF POC Negative     Bacteria Wet Prep HPF POC 3+     RBC Wet Prep HPF POC Negative     WBC Wet Prep HPF POC 20+     KOH Prep POC      POCT URINALYSIS DIPSTICK      Result Value Ref Range   Color, UA yellow     Clarity, UA clear     Glucose, UA negative     Bilirubin, UA negative     Ketones, UA negative     Spec  Grav, UA 1.020     Blood, UA negative     pH, UA 6.0     Protein, UA negative     Urobilinogen, UA 0.2     Nitrite, UA negative     Leukocytes, UA Negative       Assessment and Plan:   Midline low back pain without sciatica - Plan: POCT urinalysis dipstick  Vaginal discharge - Plan: POCT Wet Prep with KOH  Bacterial vaginosis  I am not overly concerned with her back. Alleve 2 tabs by mouth two times a day over the counter: Take at least for 2 - 3 weeks. This is equal to a prescripton strength dose (GENERIC CHEAPER EQUIVALENT IS NAPROXEN SODIUM)   Flagyl for BV.  Follow-up: No Follow-up on file.  New Prescriptions   METRONIDAZOLE  (FLAGYL) 500 MG TABLET    Take 1 tablet (500 mg total) by mouth 2 (two) times daily.   Orders Placed This Encounter  Procedures  . POCT Wet Prep with KOH  . POCT urinalysis dipstick    Signed,  Karleen Hampshire T. Cataldo Cosgriff, MD  Back pain:  Suggest moist heat: like a low level heating pad You can also use a water bottle Or a warm moist towel  A warm bath can often help Or a warm shower.  Massage is very helpful with back pain associated with muscle pain. If you have someone who lives with you who could give you a back massage, it would be a good idea.

## 2014-08-09 NOTE — Progress Notes (Signed)
Pre visit review using our clinic review tool, if applicable. No additional management support is needed unless otherwise documented below in the visit note. 

## 2014-08-12 ENCOUNTER — Telehealth: Payer: Self-pay

## 2014-08-12 MED ORDER — PREDNISONE 20 MG PO TABS: ORAL_TABLET | ORAL | Status: DC

## 2014-08-12 MED ORDER — ACETAMINOPHEN-CODEINE 300-30 MG PO TABS: 1.0000 | ORAL_TABLET | Freq: Four times a day (QID) | ORAL | Status: DC | PRN

## 2014-08-12 NOTE — Telephone Encounter (Signed)
Nesiah notified prescriptions for a pred[facility]one taper and [facility] [address]

## 2014-08-12 NOTE — Telephone Encounter (Signed)
Pt left v/m;pt was seen 08/09/14 and back pain has worsened; pt wants to know if med could be called in for back pain to [facility] [address]. Pt request cb.

## 2014-08-12 NOTE — Telephone Encounter (Signed)
For patient:  Call in  Prednisone 20 mg, 2 tabs po for 4 days, then 1 po for 4 days, #12, 0 ref  Tylenol #3, 1 po q 6 hours prn pain, #30, 0 refills

## 2015-01-11 ENCOUNTER — Other Ambulatory Visit (INDEPENDENT_AMBULATORY_CARE_PROVIDER_SITE_OTHER): Payer: Federal, State, Local not specified - PPO

## 2015-01-11 ENCOUNTER — Telehealth: Payer: Self-pay | Admitting: Family Medicine

## 2015-01-11 ENCOUNTER — Encounter: Payer: Self-pay | Admitting: Internal Medicine

## 2015-01-11 ENCOUNTER — Telehealth: Payer: Self-pay | Admitting: Internal Medicine

## 2015-01-11 ENCOUNTER — Ambulatory Visit (INDEPENDENT_AMBULATORY_CARE_PROVIDER_SITE_OTHER): Payer: Federal, State, Local not specified - PPO | Admitting: Internal Medicine

## 2015-01-11 VITALS — BP 128/78 | HR 91 | Temp 98.9°F | Resp 16 | Wt 155.0 lb

## 2015-01-11 DIAGNOSIS — D62 Acute posthemorrhagic anemia: Secondary | ICD-10-CM

## 2015-01-11 DIAGNOSIS — N920 Excessive and frequent menstruation with regular cycle: Secondary | ICD-10-CM

## 2015-01-11 LAB — CBC
HEMATOCRIT: 31.7 % — AB (ref 36.0–46.0)
HEMOGLOBIN: 10.2 g/dL — AB (ref 12.0–15.0)
MCHC: 32.1 g/dL (ref 30.0–36.0)
MCV: 75.8 fl — ABNORMAL LOW (ref 78.0–100.0)
Platelets: 340 10*3/uL (ref 150.0–400.0)
RBC: 4.18 Mil/uL (ref 3.87–5.11)
RDW: 17.1 % — ABNORMAL HIGH (ref 11.5–15.5)
WBC: 7.4 10*3/uL (ref 4.0–10.5)

## 2015-01-11 MED ORDER — TRAMADOL HCL 50 MG PO TABS: 50.0000 mg | ORAL_TABLET | Freq: Three times a day (TID) | ORAL | Status: DC | PRN

## 2015-01-11 MED ORDER — DICLOFENAC SODIUM 75 MG PO TBEC: 75.0000 mg | DELAYED_RELEASE_TABLET | Freq: Two times a day (BID) | ORAL | Status: DC

## 2015-01-11 NOTE — Progress Notes (Signed)
   Subjective:    Patient ID: Rebecca Rollins, female    DOB: Jun 04, 1991, 24 y.o.   MRN: [id number]  HPI The patient is a 24 YO female who is coming in for stomach cramps and heavy cycles. She gets the cramps about 1 day before her cycles and lasts the entire time. They are 10/10 severity and keep her from sitting down. They last about 10 days every month.This has been happening for many years but worse in the last 3 months. She bleeds with large clots for several days during each cycle and has been told in the past that she has anemia. Sometimes feels lightheaded but not dizzy and no syncope.   Review of Systems  Constitutional: Negative for fever, activity change, appetite change, fatigue and unexpected weight change.  Gastrointestinal: Positive for abdominal pain, diarrhea and abdominal distention.       With menstrual cycles  Genitourinary: Positive for vaginal bleeding, menstrual problem and pelvic pain. Negative for dysuria, frequency, vaginal discharge and difficulty urinating.      Objective:   Physical Exam  Constitutional: She is oriented to person, place, and time. She appears well-developed and well-nourished.  HENT:  Head: Normocephalic and atraumatic.  Eyes: EOM are normal.  Neck: Normal range of motion.  Cardiovascular: Normal rate and regular rhythm.   Pulmonary/Chest: Effort normal and breath sounds normal.  Abdominal: Soft. Bowel sounds are normal. There is tenderness.  Diffusely tender  Neurological: She is alert and oriented to person, place, and time.  Skin: Skin is warm and dry.   Filed Vitals:   01/11/15 1542  BP: 128/78  Pulse: 91  Temp: 98.9 F (37.2 C)  TempSrc: Oral  Resp: 16  Weight: 155 lb (70.308 kg)  SpO2: 98%      Assessment & Plan:

## 2015-01-11 NOTE — Patient Instructions (Signed)
We are going to check a blood level on you today. The lab is in the basement.   We really think that you need to have a gynecologist. If you would like to find one that is okay, if not we can find one for you. They know about all the options to help decrease your cramps every month and the amount of bleeding you are having.   Menorrhagia Menorrhagia is the medical term for when your menstrual periods are heavy or last longer than usual. With menorrhagia, every period you have may cause enough blood loss and cramping that you are unable to maintain your usual activities. CAUSES  In some cases, the cause of heavy periods is unknown, but a number of conditions may cause menorrhagia. Common causes include:  A problem with the hormone-producing thyroid gland (hypothyroid).  Noncancerous growths in the uterus (polyps or fibroids).  An imbalance of the estrogen and progesterone hormones.  One of your ovaries not releasing an egg during one or more months.  Side effects of having an intrauterine device (IUD).  Side effects of some medicines, such as anti-inflammatory medicines or blood thinners.  A bleeding disorder that stops your blood from clotting normally. SIGNS AND SYMPTOMS  During a normal period, bleeding lasts between 4 and 8 days. Signs that your periods are too heavy include:  You routinely have to change your pad or tampon every 1 or 2 hours because it is completely soaked.  You pass blood clots larger than 1 inch (2.5 cm) in size.  You have bleeding for more than 7 days.  You need to use pads and tampons at the same time because of heavy bleeding.  You need to wake up to change your pads or tampons during the night.  You have symptoms of anemia, such as tiredness, fatigue, or shortness of breath. DIAGNOSIS  Your health care provider will perform a physical exam and ask you questions about your symptoms and menstrual history. Other tests may be ordered based on what the  health care provider finds during the exam. These tests can include:  Blood tests. Blood tests are used to check if you are pregnant or have hormonal changes, a bleeding or thyroid disorder, low iron levels (anemia), or other problems.  Endometrial biopsy. Your health care provider takes a sample of tissue from the inside of your uterus to be examined under a microscope.  Pelvic ultrasound. This test uses sound waves to make a picture of your uterus, ovaries, and vagina. The pictures can show if you have fibroids or other growths.  Hysteroscopy. For this test, your health care provider will use a small telescope to look inside your uterus. Based on the results of your initial tests, your health care provider may recommend further testing. TREATMENT  Treatment may not be needed. If it is needed, your health care provider may recommend treatment with one or more medicines first. If these do not reduce bleeding enough, a surgical treatment might be an option. The best treatment for you will depend on:   Whether you need to prevent pregnancy.  Your desire to have children in the future.  The cause and severity of your bleeding.  Your opinion and personal preference.  Medicines for menorrhagia may include:  Birth control methods that use hormones. These include the pill, skin patch, vaginal ring, shots that you get every 3 months, hormonal IUD, and implant. These treatments reduce bleeding during your menstrual period.  Medicines that thicken blood and slow  bleeding.  Medicines that reduce swelling, such as ibuprofen.  Medicines that contain a synthetic hormone called progestin.   Medicines that make the ovaries stop working for a short time.  You may need surgical treatment for menorrhagia if the medicines are unsuccessful. Treatment options include:  Dilation and curettage (D&C). In this procedure, your health care provider opens (dilates) your cervix and then scrapes or suctions  tissue from the lining of your uterus to reduce menstrual bleeding.  Operative hysteroscopy. This procedure uses a tiny tube with a light (hysteroscope) to view your uterine cavity and can help in the surgical removal of a polyp that may be causing heavy periods.  Endometrial ablation. Through various techniques, your health care provider permanently destroys the entire lining of your uterus (endometrium). After endometrial ablation, most women have little or no menstrual flow. Endometrial ablation reduces your ability to become pregnant.  Endometrial resection. This surgical procedure uses an electrosurgical wire loop to remove the lining of the uterus. This procedure also reduces your ability to become pregnant.  Hysterectomy. Surgical removal of the uterus and cervix is a permanent procedure that stops menstrual periods. Pregnancy is not possible after a hysterectomy. This procedure requires anesthesia and hospitalization. HOME CARE INSTRUCTIONS   Only take over-the-counter or prescription medicines as directed by your health care provider. Take prescribed medicines exactly as directed. Do not change or switch medicines without consulting your health care provider.  Take any prescribed iron pills exactly as directed by your health care provider. Long-term heavy bleeding may result in low iron levels. Iron pills help replace the iron your body lost from heavy bleeding. Iron may cause constipation. If this becomes a problem, increase the bran, fruits, and roughage in your diet.  Do not take aspirin or medicines that contain aspirin 1 week before or during your menstrual period. Aspirin may make the bleeding worse.  If you need to change your sanitary pad or tampon more than once every 2 hours, stay in bed and rest as much as possible until the bleeding stops.  Eat well-balanced meals. Eat foods high in iron. Examples are leafy green vegetables, meat, liver, eggs, and whole grain breads and  cereals. Do not try to lose weight until the abnormal bleeding has stopped and your blood iron level is back to normal. SEEK MEDICAL CARE IF:   You soak through a pad or tampon every 1 or 2 hours, and this happens every time you have a period.  You need to use pads and tampons at the same time because you are bleeding so much.  You need to change your pad or tampon during the night.  You have a period that lasts for more than 8 days.  You pass clots bigger than 1 inch wide.  You have irregular periods that happen more or less often than once a month.  You feel dizzy or faint.  You feel very weak or tired.  You feel short of breath or feel your heart is beating too fast when you exercise.  You have nausea and vomiting or diarrhea while you are taking your medicine.  You have any problems that may be related to the medicine you are taking. SEEK IMMEDIATE MEDICAL CARE IF:   You soak through 4 or more pads or tampons in 2 hours.  You have any bleeding while you are pregnant. MAKE SURE YOU:   Understand these instructions.  Will watch your condition.  Will get help right away if you are not  doing well or get worse. Document Released: 10/22/2005 Document Revised: 10/27/2013 Document Reviewed: 04/12/2013 [facility] Patient Information 2015 [place], [place]. This information is not intended to replace advice given to you by your health care provider. Make sure you discuss any questions you have with your health care provider.

## 2015-01-11 NOTE — Progress Notes (Signed)
Pre visit review using our clinic review tool, if applicable. No additional management support is needed unless otherwise documented below in the visit note. 

## 2015-01-11 NOTE — Assessment & Plan Note (Signed)
Recommended evaluation with gynecology as she did not do well with OCPs. She is also suffering with severe cramps for 10 days of the month. Check CBC for excessive bleeding and lightheadedness. Likely will need iron pills daily.

## 2015-01-11 NOTE — Telephone Encounter (Signed)
done

## 2015-01-11 NOTE — Telephone Encounter (Signed)
Pt request referral for OBGYN, can we do this ASAP

## 2015-01-12 ENCOUNTER — Telehealth: Payer: Self-pay

## 2015-01-12 DIAGNOSIS — N920 Excessive and frequent menstruation with regular cycle: Secondary | ICD-10-CM

## 2015-01-12 NOTE — Telephone Encounter (Signed)
[facility] Dr Dorise HissKollar on 01/11/2015 and pt continues with heavy menstrual flow and cramping; started this menstrual cycle 01/10/15. Pt usually bleeds 6 days. Pt called [facility] office for referral to GYN but was advised to contact PCP's office. Pt said will wait to hear from pt care coordinator and if condition changes or worsens prior to cb pt will call [facility].

## 2015-01-13 NOTE — Telephone Encounter (Signed)
Jaylie notified as instructed by telephone.  [facility].

## 2015-01-13 NOTE — Telephone Encounter (Signed)
Agree with referral to GYN. If very heavy bleeding.. [facility]

## 2015-01-18 ENCOUNTER — Encounter: Payer: Self-pay | Admitting: Obstetrics & Gynecology

## 2015-02-23 ENCOUNTER — Encounter: Payer: Federal, State, Local not specified - PPO | Admitting: Obstetrics & Gynecology

## 2015-04-05 ENCOUNTER — Encounter: Payer: Self-pay | Admitting: Family Medicine

## 2015-04-05 ENCOUNTER — Ambulatory Visit (INDEPENDENT_AMBULATORY_CARE_PROVIDER_SITE_OTHER): Payer: Federal, State, Local not specified - PPO | Admitting: Family Medicine

## 2015-04-05 VITALS — BP 100/64 | HR 80 | Temp 98.6°F | Ht 62.0 in | Wt 152.5 lb

## 2015-04-05 DIAGNOSIS — R309 Painful micturition, unspecified: Secondary | ICD-10-CM | POA: Diagnosis not present

## 2015-04-05 DIAGNOSIS — N390 Urinary tract infection, site not specified: Secondary | ICD-10-CM

## 2015-04-05 LAB — POCT URINALYSIS DIPSTICK
BILIRUBIN UA: NEGATIVE
GLUCOSE UA: NEGATIVE
Ketones, UA: NEGATIVE
Nitrite, UA: NEGATIVE
SPEC GRAV UA: 1.015
Urobilinogen, UA: 0.2
pH, UA: 6.5

## 2015-04-05 MED ORDER — SULFAMETHOXAZOLE-TRIMETHOPRIM 800-160 MG PO TABS: 1.0000 | ORAL_TABLET | Freq: Two times a day (BID) | ORAL | Status: DC

## 2015-04-05 NOTE — Addendum Note (Signed)
Addended by: Sydell AxonLAWS, REGINA C on: 04/05/2015 11:58 AM   Modules accepted: Orders

## 2015-04-05 NOTE — Progress Notes (Signed)
Pre visit review using our clinic review tool, if applicable. No additional management support is needed unless otherwise documented below in the visit note. 

## 2015-04-05 NOTE — Progress Notes (Signed)
Subjective:     Patient ID: Rebecca Rollins, female   DOB: [birth year], 24 y.o.   MRN: [id number]  HPI Rebecca Rollins is a 24 y/o F presenting with painful urination. It started 2-3 days ago and has gotten worse since then. She is also having urinary frequency and urgency. No hematuria. Some lower abdominal pain that started around the same time. Also has left flank pain. No fevers or chills. Thinks she may have had a UTI as a child, but does not recall any other UTIs. Pt is currently on menses. Denies flu-like symptoms.  Review of Systems  Constitutional: Negative for fever and chills.  Gastrointestinal: Positive for abdominal pain. Negative for nausea, vomiting, diarrhea and constipation.  Genitourinary: Positive for dysuria, urgency, frequency, flank pain and pelvic pain. Negative for hematuria, vaginal bleeding and vaginal discharge.       Objective:   Physical Exam  Constitutional: She is oriented to person, place, and time. She appears well-developed and well-nourished.  HENT:  Head: Normocephalic and atraumatic.  Cardiovascular: Normal rate and regular rhythm.  Exam reveals no gallop and no friction rub.   No murmur heard. Pulmonary/Chest: Effort normal and breath sounds normal. She has no wheezes.  Abdominal: Soft. Bowel sounds are normal. She exhibits no distension. There is tenderness in the suprapubic area. There is CVA tenderness.  Left CVA tenderness  Neurological: She is alert and oriented to person, place, and time.  Psychiatric: She has a normal mood and affect. Her behavior is normal.       Assessment:     1. Uncomplicated UTI: Sx consistent with UTI. UA shows 2+ leukocytes, neg nitrites, 3+ blood.   Mild flank pain, no fever and no flu like symtpoms. Doubt pyelonephritis.    Plan:     1. Uncomplicated UTI: Treat empirically with TMP-SMX 1 tablet twice a day for 3 days. Send urine for culture. Call if no improvement within 3 days, fever develops, flank pain worsens, etc.       Ruthe MannanAlexis Sunni Richardson served as scribe for Dr. Ermalene SearingBedsole 04/05/15 10:45 am  Patient seen and examined. Med student acted as Neurosurgeonscribe only.  HPI/ROS/PE and assessment/plan created  By MD and scribed by med student.  Kerby NoraAmy Bedsole MD

## 2015-04-05 NOTE — Patient Instructions (Addendum)
Treat with antibiotic 1 tablet twice a day for 3 days Drink lots of water  Call if no improvement within 3 days, fever develops, flank pain worsens, etc.  We will call you with the culture results

## 2015-04-08 LAB — URINE CULTURE: Colony Count: >=100000

## 2015-05-17 ENCOUNTER — Encounter: Payer: Self-pay | Admitting: Family Medicine

## 2015-05-17 ENCOUNTER — Other Ambulatory Visit (HOSPITAL_COMMUNITY)
Admission: RE | Admit: 2015-05-17 | Discharge: 2015-05-17 | Disposition: A | Discharge status: Home / Self Care | Payer: Federal, State, Local not specified - PPO | Source: Ambulatory Visit | Attending: Family Medicine | Admitting: Family Medicine

## 2015-05-17 ENCOUNTER — Ambulatory Visit (INDEPENDENT_AMBULATORY_CARE_PROVIDER_SITE_OTHER): Payer: Federal, State, Local not specified - PPO | Admitting: Family Medicine

## 2015-05-17 VITALS — BP 100/60 | HR 76 | Temp 98.8°F | Ht 62.0 in | Wt 153.0 lb

## 2015-05-17 DIAGNOSIS — Z01419 Encounter for gynecological examination (general) (routine) without abnormal findings: Secondary | ICD-10-CM | POA: Insufficient documentation

## 2015-05-17 DIAGNOSIS — Z113 Encounter for screening for infections with a predominantly sexual mode of transmission: Secondary | ICD-10-CM

## 2015-05-17 DIAGNOSIS — Z13220 Encounter for screening for lipoid disorders: Secondary | ICD-10-CM

## 2015-05-17 DIAGNOSIS — Z124 Encounter for screening for malignant neoplasm of cervix: Secondary | ICD-10-CM

## 2015-05-17 DIAGNOSIS — Z0000 Encounter for general adult medical examination without abnormal findings: Secondary | ICD-10-CM

## 2015-05-17 LAB — COMPREHENSIVE METABOLIC PANEL
ALK PHOS: 77 U/L (ref 39–117)
ALT: 10 U/L (ref 0–35)
AST: 18 U/L (ref 0–37)
Albumin: 3.8 g/dL (ref 3.5–5.2)
BUN: 7 mg/dL (ref 6–23)
CO2: 29 meq/L (ref 19–32)
Calcium: 9.4 mg/dL (ref 8.4–10.5)
Chloride: 105 mEq/L (ref 96–112)
Creatinine, Ser: 0.81 mg/dL (ref 0.40–1.20)
GFR: 113.22 mL/min (ref >60.00)
Glucose, Bld: 75 mg/dL (ref 70–99)
POTASSIUM: 4.3 meq/L (ref 3.5–5.1)
SODIUM: 139 meq/L (ref 135–145)
Total Bilirubin: 0.3 mg/dL (ref 0.2–1.2)
Total Protein: 7.6 g/dL (ref 6.0–8.3)

## 2015-05-17 LAB — LIPID PANEL
Cholesterol: 160 mg/dL (ref 0–200)
HDL: 69.7 mg/dL (ref >39.00)
LDL Cholesterol: 80 mg/dL (ref 0–99)
NonHDL: 90.3
Total CHOL/HDL Ratio: 2
Triglycerides: 54 mg/dL (ref 0.0–149.0)
VLDL: 10.8 mg/dL (ref 0.0–40.0)

## 2015-05-17 NOTE — Patient Instructions (Addendum)
Work on healthy eating and start regular exercise 3-5 times a week.  Stop at lab on way out.

## 2015-05-17 NOTE — Progress Notes (Signed)
Pre visit review using our clinic review tool, if applicable. No additional management support is needed unless otherwise documented below in the visit note. 

## 2015-05-17 NOTE — Addendum Note (Signed)
Addended by: Binnie KandLAWS, Janiqua Friscia C on: 05/17/2015 08:48 AM   Modules accepted: Orders

## 2015-05-17 NOTE — Progress Notes (Signed)
Subjective:    Patient ID: Rebecca Rollins, female    DOB: August 13, 1991, 24 y.o.   MRN: [id number]  HPI  24 year old female presents for concern of  STD. She wants screening tests.  No known exposure.   Last STD test 06/04/2015  She has never had CPX.   She is overdue for CPX.   Diet: moderate, some junk food, drinks a lot of water, no sod.  Non smoker  ETOH: 2-3 drinks every 2 weeks.  Drugs: None  Exercise:  Goes to gym every few weeks.  Planning on going into [employer].  Social History /Family History/Past Medical History reviewed and updated if needed.   Body mass index is 27.98 kg/(m^2).    Review of Systems  Constitutional: Negative for fever and fatigue.  HENT: Negative for congestion.   Eyes: Negative for pain.  Respiratory: Negative for cough and shortness of breath.   Cardiovascular: Negative for chest pain, palpitations and leg swelling.  Gastrointestinal: Negative for abdominal pain.  Genitourinary: Negative for dysuria and vaginal bleeding.       Heavy menses and  severe cramping, gradually worsening as she gets older.  Neurological: Negative for syncope, light-headedness and headaches.  Psychiatric/Behavioral: Negative for dysphoric mood.       Objective:   Physical Exam  Constitutional: Vital signs are normal. She appears well-developed and well-nourished. She is cooperative.  Non-toxic appearance. She does not appear ill. No distress.  HENT:  Head: Normocephalic.  Right Ear: Hearing, tympanic membrane, external ear and ear canal normal.  Left Ear: Hearing, tympanic membrane, external ear and ear canal normal.  Nose: Nose normal.  Eyes: Conjunctivae, EOM and lids are normal. Pupils are equal, round, and reactive to light. Lids are everted and swept, no foreign bodies found.  Neck: Trachea normal and normal range of motion. Neck supple. Carotid bruit is not present. No thyroid mass and no thyromegaly present.  Cardiovascular: Normal rate, regular rhythm,  S1 normal, S2 normal, normal heart sounds and intact distal pulses.  Exam reveals no gallop.   No murmur heard. Pulmonary/Chest: Effort normal and breath sounds normal. No respiratory distress. She has no wheezes. She has no rhonchi. She has no rales.  Abdominal: Soft. Normal appearance and bowel sounds are normal. She exhibits no distension, no fluid wave, no abdominal bruit and no mass. There is no hepatosplenomegaly. There is no tenderness. There is no rebound, no guarding and no CVA tenderness. No hernia.  Genitourinary: Vagina normal and uterus normal. No breast swelling, tenderness, discharge or bleeding. Pelvic exam was performed with patient supine. There is no rash, tenderness or lesion on the right labia. There is no rash, tenderness or lesion on the left labia. Uterus is not enlarged and not tender. Cervix exhibits no motion tenderness, no discharge and no friability. Right adnexum displays no mass, no tenderness and no fullness. Left adnexum displays no mass, no tenderness and no fullness.  Lymphadenopathy:    She has no cervical adenopathy.    She has no axillary adenopathy.  Neurological: She is alert. She has normal strength. No cranial nerve deficit or sensory deficit.  Skin: Skin is warm, dry and intact. No rash noted.  Psychiatric: Her speech is normal and behavior is normal. Judgment normal. Her mood appears not anxious. Cognition and memory are normal. She does not exhibit a depressed mood.          Assessment & Plan:  The patient's preventative maintenance and recommended screening tests for  an annual wellness exam were reviewed in full today. Brought up to date unless services declined.  Counselled on the importance of diet, exercise, and its role in overall health and mortality. The patient's FH and SH was reviewed, including their home life, tobacco status, and drug and alcohol status.

## 2015-05-18 LAB — HIV ANTIBODY (ROUTINE TESTING W REFLEX): HIV 1&2 Ab, 4th Generation: NONREACTIVE

## 2015-05-18 LAB — HEPATITIS PANEL, ACUTE
HCV Ab: NEGATIVE
HEP A IGM: NONREACTIVE
HEP B C IGM: NONREACTIVE
Hepatitis B Surface Ag: NEGATIVE

## 2015-05-18 LAB — RPR

## 2015-05-18 LAB — HSV(HERPES SMPLX)ABS-I+II(IGG+IGM)-BLD
HSV 2 GLYCOPROTEIN G AB, IGG: 0.26 IV
Herpes Simplex Vrs I&II-IgM Ab (EIA): 1.57 INDEX — ABNORMAL HIGH

## 2015-05-18 LAB — CYTOLOGY - PAP

## 2015-07-29 ENCOUNTER — Ambulatory Visit: Payer: Federal, State, Local not specified - PPO | Admitting: Family Medicine

## 2015-10-21 ENCOUNTER — Encounter: Payer: Self-pay | Admitting: Family Medicine

## 2015-10-21 ENCOUNTER — Ambulatory Visit (INDEPENDENT_AMBULATORY_CARE_PROVIDER_SITE_OTHER): Payer: Federal, State, Local not specified - PPO | Admitting: Family Medicine

## 2015-10-21 VITALS — BP 104/66 | HR 64 | Temp 99.8°F | Wt 149.2 lb

## 2015-10-21 DIAGNOSIS — R6889 Other general symptoms and signs: Secondary | ICD-10-CM | POA: Diagnosis not present

## 2015-10-21 NOTE — Progress Notes (Signed)
Pre visit review using our clinic review tool, if applicable. No additional management support is needed unless otherwise documented below in the visit note.  Sx started last week.  ST.  Then felt irritation in the throat, is some better now.  Now with diffuse aches.  Had some chills.  Didn't know if she had a fever, but felt hot.  No ear pain but feels stuffy.  Stuffy nose, some better now.  ST better now.  Some cough, some better now.  No sputum now, had some prev.  Still clearing her throat.  No facial pain.  Eyes feel sore B.  Known flu exposure.  Some better overall except for the aches.   Hadn't had a flu shot yet this year.    Meds, vitals, and allergies reviewed.   ROS: See HPI.  Otherwise, noncontributory.  GEN: nad, alert and oriented HEENT: mucous membranes moist, tm w/o erythema, nasal exam w/o erythema, clear discharge noted,  OP with cobblestoning NECK: supple w/o LA CV: rrr.   PULM: ctab, no inc wob EXT: no edema SKIN: no acute rash

## 2015-10-21 NOTE — Patient Instructions (Signed)
Possible/likely flu.  You look okay now.   Drink plenty of fluids, take tylenol or ibuprofen (with food) as needed, and gargle with warm salt water for your throat.  This should gradually improve.  Take care.  Let us know if you have other concerns.   Out of work for now.  Go back when you feel better.   Update us as needed.

## 2015-10-23 DIAGNOSIS — R6889 Other general symptoms and signs: Secondary | ICD-10-CM | POA: Insufficient documentation

## 2015-10-23 NOTE — Assessment & Plan Note (Signed)
At this point, flu testing wouldn't change mgmt with sx starting way before the useful point for tamiflu.  She isn't toxic.  Supportive care.   Possible/likely flu.  Drink plenty of fluids, take tylenol or ibuprofen (with food) as needed, and gargle with warm salt water for throat pain This should gradually improve.  Out of work for now. Go back when she feels better.  Update us as needed.  She agrees.

## 2016-03-30 ENCOUNTER — Ambulatory Visit: Payer: Federal, State, Local not specified - PPO | Admitting: Family Medicine

## 2016-03-30 DIAGNOSIS — Z0289 Encounter for other administrative examinations: Secondary | ICD-10-CM

## 2016-05-10 ENCOUNTER — Ambulatory Visit (INDEPENDENT_AMBULATORY_CARE_PROVIDER_SITE_OTHER): Payer: Federal, State, Local not specified - PPO | Admitting: Family Medicine

## 2016-05-10 ENCOUNTER — Other Ambulatory Visit: Payer: Self-pay | Admitting: Family Medicine

## 2016-05-10 ENCOUNTER — Encounter: Payer: Self-pay | Admitting: Family Medicine

## 2016-05-10 DIAGNOSIS — N760 Acute vaginitis: Secondary | ICD-10-CM | POA: Diagnosis not present

## 2016-05-10 DIAGNOSIS — Z113 Encounter for screening for infections with a predominantly sexual mode of transmission: Secondary | ICD-10-CM

## 2016-05-10 NOTE — Addendum Note (Signed)
Addended by: Desmond DikeKNIGHT, Didi Ganaway H on: 05/10/2016 09:01 AM   Modules accepted: Orders

## 2016-05-10 NOTE — Patient Instructions (Signed)
Good to see you. We will call you with your results from today.  Please abstain from intercourse until we have these results.

## 2016-05-10 NOTE — Progress Notes (Signed)
SUBJECTIVE:  25 y.o. female pt of Dr. Ermalene SearingBedsole, new to me, complains of copious and malodorous vaginal discharge for 2 day(s). Denies abnormal vaginal bleeding or significant pelvic pain or fever. No UTI symptoms.   Diagnosed with HSV 2 per in April 2017 at [facility].  Placed on Valtrex.  Has no active lesions but having some tingling in her labia like she did prior to getting diagnosed.  Patient's last menstrual period was 04/16/2016.  No current outpatient prescriptions on file prior to visit.   No current facility-administered medications on file prior to visit.    Allergies  Allergen Reactions  . Penicillins     REACTION: ? Rash    Past Medical History  Diagnosis Date  . Reflux     Past Surgical History  Procedure Laterality Date  . Mouth surgery  2011    Family History  Problem Relation Age of Onset  . Hypertension Mother   . Anemia Mother   . Diabetes Maternal Uncle   . Hypertension Maternal Grandmother   . Hypertension Maternal Grandfather     Social History   Social History  . Marital Status: Single    Spouse Name: N/A  . Number of Children: N/A  . Years of Education: N/A   Occupational History  . Not on file.   Social History Main Topics  . Smoking status: Never Smoker   . Smokeless tobacco: Never Used  . Alcohol Use: 0.0 oz/week    0 Standard drinks or equivalent per week     Comment: occasinally, 2-3 drinks once every 6 months  . Drug Use: No  . Sexual Activity: Yes    Birth Control/ Protection: Condom   Other Topics Concern  . Not on file   Social History Narrative   11th grade at [employer]   As and Bs   Plans on college   Diet: some fruits and veggies   No sports   Works at [employer]   Exercise: only in gym   [employer] TV and phone   Sexually active...first at 89   The PMH, PSH, Social History, Family History, Medications, and allergies have been reviewed in [facility], and have been updated if relevant.  OBJECTIVE:  BP 114/70 mmHg   Pulse 94  Temp(Src) 98.3 F (36.8 C) (Oral)  Wt 145 lb 8 oz (65.998 kg)  SpO2 100%  LMP 04/16/2016  She appears well, afebrile. Abdomen: benign, soft, nontender, no masses. Pelvic Exam: VULVA: normal appearing vulva with no masses, tenderness or lesions, VAGINA: {details:315903::normal appearing vagina Thick, malodorous discharge   ASSESSMENT:  nonspecific vaginitis, no active herpes lesions seen  PLAN:  GC and chlamydia DNA  probe sent to lab.  ROV prn if symptoms persist or worsen.  Orders Placed This Encounter  Procedures  . GC/Chlamydia Probe Amp  . WET PREP BY MOLECULAR PROBE  . HSV(herpes smplx)abs-1+2(IgG+IgM)-bld  . HIV antibody (with reflex)  . RPR

## 2016-05-10 NOTE — Progress Notes (Signed)
Pre visit review using our clinic review tool, if applicable. No additional management support is needed unless otherwise documented below in the visit note. 

## 2016-05-11 ENCOUNTER — Other Ambulatory Visit: Payer: Self-pay | Admitting: Family Medicine

## 2016-05-11 LAB — HSV(HERPES SMPLX)ABS-I+II(IGG+IGM)-BLD
HERPES SIMPLEX VRS I-IGM AB (EIA): 5 {index} — AB
HSV 1 Glycoprotein G Ab, IgG: 12.9 Index — ABNORMAL HIGH (ref <0.90)
HSV 2 Glycoprotein G Ab, IgG: <0.9 Index (ref <0.90)

## 2016-05-11 LAB — WET PREP BY MOLECULAR PROBE
CANDIDA SPECIES: NEGATIVE
GARDNERELLA VAGINALIS: POSITIVE — AB
TRICHOMONAS VAG: NEGATIVE

## 2016-05-11 LAB — GC/CHLAMYDIA PROBE AMP
CT Probe RNA: NOT DETECTED
GC Probe RNA: NOT DETECTED

## 2016-05-11 LAB — RPR

## 2016-05-11 LAB — HIV ANTIBODY (ROUTINE TESTING W REFLEX): HIV: NONREACTIVE

## 2016-05-11 MED ORDER — METRONIDAZOLE 500 MG PO TABS: 500.0000 mg | ORAL_TABLET | Freq: Two times a day (BID) | ORAL | Status: DC

## 2016-05-30 ENCOUNTER — Encounter: Payer: Self-pay | Admitting: Primary Care

## 2016-05-30 ENCOUNTER — Ambulatory Visit (INDEPENDENT_AMBULATORY_CARE_PROVIDER_SITE_OTHER): Payer: Federal, State, Local not specified - PPO | Admitting: Primary Care

## 2016-05-30 VITALS — BP 114/68 | HR 90 | Temp 98.3°F | Ht 62.0 in | Wt 143.0 lb

## 2016-05-30 DIAGNOSIS — Z0000 Encounter for general adult medical examination without abnormal findings: Secondary | ICD-10-CM

## 2016-05-30 DIAGNOSIS — Z111 Encounter for screening for respiratory tuberculosis: Secondary | ICD-10-CM | POA: Diagnosis not present

## 2016-05-30 MED ORDER — TUBERCULIN PPD 5 UNIT/0.1ML ID SOLN: 5.0000 [IU] | Freq: Once | INTRADERMAL | Status: DC

## 2016-05-30 NOTE — Assessment & Plan Note (Signed)
Tetanus UTD. Pap normal in 2016, due in 2019. Recent STD testing completed. Lipids in 2016 normal, no need to repeat at this time as she is at low risk. Discussed to increase consumption of fruit, vegetables, whole grains in diet. Start exercising. Discussed safety such as seat belt use and protection during intercourse. Form completed for occupation. Follow up in 1 year for repeat physical.

## 2016-05-30 NOTE — Addendum Note (Signed)
Addended by: Tawnya Crook on: 05/30/2016 10:37 AM   Modules accepted: Orders

## 2016-05-30 NOTE — Progress Notes (Signed)
Pre visit review using our clinic review tool, if applicable. No additional management support is needed unless otherwise documented below in the visit note. 

## 2016-05-30 NOTE — Patient Instructions (Signed)
Work to increase consumption of vegetables, lean protein, fruit, whole grains in your diet.  Ensure you are consuming 64 ounces of water daily.  Start exercising. You should be getting 1 hour of moderate intensity exercise 5 days weekly.  Try taking Zantac (ranitidine) once daily to help with appetite and discomfort. This may be purchased over the counter.  Return Friday this week for your TB test read.  Follow up in 1 year for repeat physical or sooner if needed.  It was a pleasure to meet you today!

## 2016-05-30 NOTE — Progress Notes (Signed)
Subjective:    Patient ID: Rebecca Rollins, female    DOB: [birth year], 25 y.o.   MRN: [id number]  HPI  Rebecca Rollins is a 25 year old female who presents today for complete physical.  Immunizations: -Tetanus: Completed in 2012 -Influenza: Did not complete last season  Diet: She endorses a fair diet. Breakfast: Grits, eggs, bacon Lunch: Fast food Dinner: Skips, chips, applesauce Snacks: Occasionally Desserts: Cookies, 3 times weekly Beverages: Water, occasional soda  Exercise: She does not currently exercise Eye exam: Completed 1 year ago Dental exam: Completed several years ago  Pap Smear: Completed in July 2016   Review of Systems  Constitutional: Negative for unexpected weight change.  HENT: Negative for rhinorrhea.   Respiratory: Negative for cough and shortness of breath.   Cardiovascular: Negative for chest pain.  Gastrointestinal: Negative for constipation and diarrhea.  Genitourinary: Negative for difficulty urinating and menstrual problem.  Musculoskeletal: Negative for arthralgias and myalgias.  Skin: Negative for rash.  Allergic/Immunologic: Negative for environmental allergies.  Neurological: Negative for dizziness, numbness and headaches.  Psychiatric/Behavioral:       Denies concerns for anxiety or depression       Past Medical History:  Diagnosis Date  . Reflux      Social History   Social History  . Marital status: Single    Spouse name: N/A  . Number of children: N/A  . Years of education: N/A   Occupational History  . Not on file.   Social History Main Topics  . Smoking status: Never Smoker  . Smokeless tobacco: Never Used  . Alcohol use 0.0 oz/week     Comment: occasinally, 2-3 drinks once every 6 months  . Drug use: No  . Sexual activity: Yes    Birth control/ protection: Condom   Other Topics Concern  . Not on file   Social History Narrative   11th grade at [employer]   As and Bs   Plans on college   Diet: some  fruits and veggies   No sports   Works at [employer]   Exercise: only in gym   [employer] and phone   Sexually active...first at 20    Past Surgical History:  Procedure Laterality Date  . MOUTH SURGERY  2011    Family History  Problem Relation Age of Onset  . Hypertension Mother   . Anemia Mother   . Diabetes Maternal Uncle   . Hypertension Maternal Grandmother   . Hypertension Maternal Grandfather     Allergies  Allergen Reactions  . Penicillins     REACTION: ? Rash    No current outpatient prescriptions on file prior to visit.   No current facility-administered medications on file prior to visit.     BP 114/68 (BP Location: Right Arm, Patient Position: Sitting, Cuff Size: Normal)   Pulse 90   Temp 98.3 F (36.8 C) (Oral)   Ht  (1.575 m)   Wt 143 lb (64.9 kg)   LMP 05/13/2016   SpO2 98%   BMI 26.16 kg/m    Objective:   Physical Exam  Constitutional: She is oriented to person, place, and time. She appears well-nourished.  HENT:  Right Ear: Tympanic membrane and ear canal normal.  Left Ear: Tympanic membrane and ear canal normal.  Nose: Nose normal.  Mouth/Throat: Oropharynx is clear and moist.  Eyes: Conjunctivae and EOM are normal. Pupils are equal, round, and reactive to light.  Neck: Neck supple. No thyromegaly present.  Cardiovascular: Normal rate and regular rhythm.   No murmur heard. Pulmonary/Chest: Effort normal and breath sounds normal. She has no rales.  Abdominal: Soft. Bowel sounds are normal. There is no tenderness.  Musculoskeletal: Normal range of motion.  Lymphadenopathy:    She has no cervical adenopathy.  Neurological: She is alert and oriented to person, place, and time. She has normal reflexes. No cranial nerve deficit.  Skin: Skin is warm and dry. No rash noted.  Psychiatric: She has a normal mood and affect.          Assessment & Plan:

## 2016-06-01 LAB — TB SKIN TEST
INDURATION: 0 mm
TB Skin Test: NEGATIVE

## 2016-08-07 ENCOUNTER — Ambulatory Visit (INDEPENDENT_AMBULATORY_CARE_PROVIDER_SITE_OTHER): Payer: Federal, State, Local not specified - PPO | Admitting: Family Medicine

## 2016-08-07 ENCOUNTER — Encounter: Payer: Self-pay | Admitting: Family Medicine

## 2016-08-07 VITALS — BP 104/58 | HR 82 | Temp 98.7°F | Ht 62.0 in | Wt 139.8 lb

## 2016-08-07 DIAGNOSIS — B009 Herpesviral infection, unspecified: Secondary | ICD-10-CM | POA: Diagnosis not present

## 2016-08-07 MED ORDER — VALACYCLOVIR HCL 500 MG PO TABS: 500.0000 mg | ORAL_TABLET | Freq: Two times a day (BID) | ORAL | 1 refills | Status: DC

## 2016-08-07 NOTE — Patient Instructions (Signed)
Take the valcyclovir as directed  Keep are clean with gentle soap and water or just water flush (dove soap) Do not shave the area at this time  In the future take the medicine at the first sign of symptoms   Take care

## 2016-08-07 NOTE — Progress Notes (Signed)
Pre visit review using our clinic review tool, if applicable. No additional management support is needed unless otherwise documented below in the visit note. 

## 2016-08-07 NOTE — Progress Notes (Signed)
Subjective:    Patient ID: Rebecca Rollins, female    DOB: 1991/10/09, 25 y.o.   MRN: [id number]  HPI Here for what she thinks she has a herpes outbreak  Was dx in April  Took valcyclovir   Started Thursday or Friday -on the outside   No chance she is pregnant  Is not sexually active and not on birth control   No vaginal d/c or itching   Just a painful rash   Was screened for other std at the time of dx  No fever or abd pain   Patient Active Problem List   Diagnosis Date Noted  . HSV (herpes simplex virus) infection 08/07/2016  . Preventative health care 05/30/2016  . Vaginitis and vulvovaginitis 05/10/2016  . Screening for STD (sexually transmitted disease) 05/10/2016  . Flu-like symptoms 10/23/2015  . Menorrhagia 11/20/2013  . Acne vulgaris 03/17/2013  . GERD 03/20/2010  . DYSMENORRHEA 03/20/2010   Past Medical History:  Diagnosis Date  . Reflux    Past Surgical History:  Procedure Laterality Date  . MOUTH SURGERY  2011   Social History  Substance Use Topics  . Smoking status: Never Smoker  . Smokeless tobacco: Never Used  . Alcohol use 0.0 oz/week     Comment: occasinally, 2-3 drinks once every 6 months   Family History  Problem Relation Age of Onset  . Hypertension Mother   . Anemia Mother   . Diabetes Maternal Uncle   . Hypertension Maternal Grandmother   . Hypertension Maternal Grandfather    Allergies  Allergen Reactions  . Penicillins     REACTION: ? Rash   No current outpatient prescriptions on file prior to visit.   No current facility-administered medications on file prior to visit.     Review of Systems Review of Systems  Constitutional: Negative for fever, appetite change, fatigue and unexpected weight change.  Eyes: Negative for pain and visual disturbance.  Respiratory: Negative for cough and shortness of breath.   Cardiovascular: Negative for cp or palpitations    Gastrointestinal: Negative for nausea, diarrhea and constipation.   Genitourinary: Negative for urgency and frequency. pos for herpes outbreak, neg for vag d/c Skin: Negative for pallor and pos for painful rash on vulva  Neurological: Negative for weakness, light-headedness, numbness and headaches.  Hematological: Negative for adenopathy. Does not bruise/bleed easily.  Psychiatric/Behavioral: Negative for dysphoric mood. The patient is not nervous/anxious.         Objective:   Physical Exam  Constitutional: She appears well-developed and well-nourished. No distress.  HENT:  Head: Normocephalic and atraumatic.  Mouth/Throat: Oropharynx is clear and moist. No oropharyngeal exudate.  Eyes: Conjunctivae and EOM are normal. Pupils are equal, round, and reactive to light. Right eye exhibits no discharge. Left eye exhibits no discharge.  Neck: Normal range of motion. Neck supple.  Cardiovascular: Normal rate and regular rhythm.   Genitourinary:  Genitourinary Comments: Rash- clusters of tiny vesicles on anterior vulva  Scant redness/no swelling Mildly tender No vaginal d/c No inguinal adenopathy  Musculoskeletal: She exhibits no edema.  Lymphadenopathy:    She has no cervical adenopathy.  Neurological: She is alert.  Skin: Skin is warm and dry. Rash noted. No pallor.  Psychiatric: She has a normal mood and affect.          Assessment & Plan:   Problem List Items Addressed This Visit      Other   HSV (herpes simplex virus) infection    HSV 2 outbreak  This is not as severe as the first- on external anterior vulva  Px valtrex 500 mg bid for 3 days with a refill  Disc cleansing and local care  Update if not starting to improve in a week or if worsening   She declines addn std testing-saying she has done it      Relevant Medications   valACYclovir (VALTREX) 500 MG tablet    Other Visit Diagnoses   None.

## 2016-08-09 NOTE — Assessment & Plan Note (Addendum)
HSV 2 outbreak  This is not as severe as the first- on external anterior vulva  Px valtrex 500 mg bid for 3 days with a refill  Disc cleansing and local care  Update if not starting to improve in a week or if worsening   She declines addn std testing-saying she has done it

## 2016-12-25 ENCOUNTER — Encounter: Payer: Self-pay | Admitting: Primary Care

## 2016-12-25 ENCOUNTER — Ambulatory Visit (INDEPENDENT_AMBULATORY_CARE_PROVIDER_SITE_OTHER): Payer: BLUE CROSS/BLUE SHIELD | Admitting: Primary Care

## 2016-12-25 VITALS — BP 120/78 | HR 93 | Temp 98.2°F | Ht 62.0 in | Wt 137.8 lb

## 2016-12-25 DIAGNOSIS — N898 Other specified noninflammatory disorders of vagina: Secondary | ICD-10-CM | POA: Diagnosis not present

## 2016-12-25 NOTE — Progress Notes (Signed)
Pre visit review using our clinic review tool, if applicable. No additional management support is needed unless otherwise documented below in the visit note. 

## 2016-12-25 NOTE — Addendum Note (Signed)
Addended by: Tawnya CrookSAMBATH, Kynedi Profitt on: 12/25/2016 08:36 AM   Modules accepted: Orders

## 2016-12-25 NOTE — Patient Instructions (Signed)
We will notify you of your results once received.  Refrain from intercourse until we contact you later today or tomorrow.  It was a pleasure meeting you!

## 2016-12-25 NOTE — Progress Notes (Signed)
   Subjective:    Patient ID: Rebecca Rollins, female    DOB: December 14, 1990, 26 y.o.   MRN: [id number]  HPI   Rebecca Rollins is a 26 year old female with a history of vaginitis and vulvovaginitis who presents today with a chief complaint of vaginal discharge. She also reports abdominal discomfort. She first noticed this 2-3 days ago. The discharge is a whitish/yellow in color with a foul smell. She denies dysuria, hematuria, urinary frequency, vaginal itching, constipation, nausea. She's not taken anything OTC.   Review of Systems  Constitutional: Negative for fever.  Gastrointestinal: Negative for nausea.  Genitourinary: Positive for vaginal discharge. Negative for dysuria, frequency, hematuria, pelvic pain and vaginal pain.       Past Medical History:  Diagnosis Date  . Reflux      Social History   Social History  . Marital status: Single    Spouse name: N/A  . Number of children: N/A  . Years of education: N/A   Occupational History  . Not on file.   Social History Main Topics  . Smoking status: Never Smoker  . Smokeless tobacco: Never Used  . Alcohol use 0.0 oz/week     Comment: occasinally, 2-3 drinks once every 6 months  . Drug use: No  . Sexual activity: Yes    Birth control/ protection: Condom   Other Topics Concern  . Not on file   Social History Narrative   11th grade at [employer]   As and Bs   Plans on college   Diet: some fruits and veggies   No sports   Works at [employer]   Exercise: only in gym   [employer] and phone   Sexually active...first at [age]    Past Surgical History:  Procedure Laterality Date  . MOUTH SURGERY  2011    Family History  Problem Relation Age of Onset  . Hypertension Mother   . Anemia Mother   . Diabetes Maternal Uncle   . Hypertension Maternal Grandmother   . Hypertension Maternal Grandfather     Allergies  Allergen Reactions  . Penicillins     REACTION: ? Rash    No current outpatient prescriptions on file  prior to visit.   No current facility-administered medications on file prior to visit.     BP 120/78   Pulse 93   Temp 98.2 F (36.8 C) (Oral)   Ht 5\' 2"  (1.575 m)   Wt 137 lb 12.8 oz (62.5 kg)   LMP 11/28/2016 (Within Days)   SpO2 99%   BMI 25.20 kg/m    Objective:   Physical Exam  Constitutional: She appears well-nourished.  Cardiovascular: Normal rate and regular rhythm.   Pulmonary/Chest: Effort normal and breath sounds normal.  Abdominal: Soft. Bowel sounds are normal.  Genitourinary: There is no tenderness or lesion on the right labia. There is no tenderness or lesion on the left labia. Uterus is not tender. Cervix exhibits discharge. Cervix exhibits no motion tenderness. Vaginal discharge found.  Skin: Skin is warm and dry.          Assessment & Plan:  Vaginal Discharge:  Present x 2-3 days. Whitish in color. History of BV and yeast. Wet prep collected and pending. Gonorrhea/chlamydia collected and pending. She declines other STD testing. Exam today overall stable. Will await lab results.  Rebecca Rollins,Rebecca Kensinger Kendal, NP

## 2016-12-26 ENCOUNTER — Telehealth: Payer: Self-pay

## 2016-12-26 DIAGNOSIS — B9689 Other specified bacterial agents as the cause of diseases classified elsewhere: Secondary | ICD-10-CM

## 2016-12-26 DIAGNOSIS — N760 Acute vaginitis: Principal | ICD-10-CM

## 2016-12-26 LAB — GC/CHLAMYDIA PROBE AMP
CT Probe RNA: NOT DETECTED
GC PROBE AMP APTIMA: NOT DETECTED

## 2016-12-26 LAB — WET PREP BY MOLECULAR PROBE
CANDIDA SPECIES: NEGATIVE
Gardnerella vaginalis: POSITIVE — AB
Trichomonas vaginosis: NEGATIVE

## 2016-12-26 MED ORDER — METRONIDAZOLE 500 MG PO TABS
500.0000 mg | ORAL_TABLET | Freq: Two times a day (BID) | ORAL | 0 refills | Status: DC
Start: 2016-12-26 — End: 2017-01-18

## 2016-12-26 NOTE — Telephone Encounter (Signed)
Please notify patient that I sent a prescription for metronidazole tablets to her pharmacy. Take 1 tablet by mouth twice daily for 7 days. Do NOT drink alcohol while taking this medication.

## 2016-12-26 NOTE — Telephone Encounter (Signed)
Pt left v/m; responding to mychart note; pt would prefer pills instead of topical gel. Pt request cb.

## 2016-12-27 NOTE — Telephone Encounter (Signed)
Spoken and notified patient of Kate's comments. Patient verbalized understanding. 

## 2017-01-18 ENCOUNTER — Encounter: Payer: Self-pay | Admitting: Primary Care

## 2017-01-18 ENCOUNTER — Ambulatory Visit (INDEPENDENT_AMBULATORY_CARE_PROVIDER_SITE_OTHER): Payer: Federal, State, Local not specified - PPO | Admitting: Primary Care

## 2017-01-18 VITALS — BP 110/78 | HR 110 | Temp 98.2°F | Ht 62.0 in | Wt 147.4 lb

## 2017-01-18 DIAGNOSIS — L298 Other pruritus: Secondary | ICD-10-CM | POA: Diagnosis not present

## 2017-01-18 DIAGNOSIS — N898 Other specified noninflammatory disorders of vagina: Secondary | ICD-10-CM | POA: Diagnosis not present

## 2017-01-18 MED ORDER — FLUCONAZOLE 150 MG PO TABS: 150.0000 mg | ORAL_TABLET | Freq: Once | ORAL | 0 refills | Status: AC

## 2017-01-18 NOTE — Patient Instructions (Addendum)
Your symptoms and exam represent a vaginal yeast infection.  Start fluconazole (Diflucan) 150 mg tablet. Take 1 tablet by mouth once.  I will confirm your results Monday next week.  You are not pregnant.  It was a pleasure to see you today!

## 2017-01-18 NOTE — Progress Notes (Signed)
Subjective:    Patient ID: Rebecca Rollins, female    DOB: 27-Mar-1991, 26 y.o.   MRN: [id number]  HPI  Ms. Rebecca Rollins is a 26 year old female with a history of vaginitis and vulvovaginitis who presents today with a chief complaint of vaginal discharge. She also reports vaginal itching. She was treated for bacterial vaginosis three weeks ago with a metronidazole course for which she completed. Her discharge is whitish in color and has noticed copious amounts. She first noticed this 2-3 days ago. She's not tried anything OTC. Her LMP was 12/28/16. Her family members want a pregnancy test as she has been eating odd things and has noticed breast tenderness over the past 1-2 weeks. She denies urinary frequency, dysuria, hematuria.  Review of Systems  Constitutional: Negative for fever.  Respiratory: Negative for shortness of breath.   Cardiovascular: Negative for chest pain.  Gastrointestinal: Negative for abdominal pain and nausea.  Genitourinary: Positive for vaginal discharge. Negative for dysuria, flank pain, frequency, hematuria, menstrual problem and pelvic pain.       Vaginal itching       Past Medical History:  Diagnosis Date  . Reflux      Social History   Social History  . Marital status: Single    Spouse name: N/A  . Number of children: N/A  . Years of education: N/A   Occupational History  . Not on file.   Social History Main Topics  . Smoking status: Never Smoker  . Smokeless tobacco: Never Used  . Alcohol use 0.0 oz/week     Comment: occasinally, 2-3 drinks once every 6 months  . Drug use: No  . Sexual activity: Yes    Birth control/ protection: Condom   Other Topics Concern  . Not on file   Social History Narrative   11th grade at [employer]   As and Bs   Plans on college   Diet: some fruits and veggies   No sports   Works at [employer]   Exercise: only in gym   [employer] and phone   Sexually active...first at 69    Past Surgical History:    Procedure Laterality Date  . MOUTH SURGERY  2011    Family History  Problem Relation Age of Onset  . Hypertension Mother   . Anemia Mother   . Diabetes Maternal Uncle   . Hypertension Maternal Grandmother   . Hypertension Maternal Grandfather     Allergies  Allergen Reactions  . Penicillins     REACTION: ? Rash REACTION: ? Rash    No current outpatient prescriptions on file prior to visit.   No current facility-administered medications on file prior to visit.     BP 110/78   Pulse (!) 110   Temp 98.2 F (36.8 C) (Oral)   Ht 5\' 2"  (1.575 m)   Wt 147 lb 6.4 oz (66.9 kg)   LMP 12/30/2016   SpO2 99%   BMI 26.96 kg/m    Objective:   Physical Exam  Neck: Neck supple.  Cardiovascular: Normal rate and regular rhythm.   Pulmonary/Chest: Effort normal and breath sounds normal.  Genitourinary: There is no tenderness or lesion on the right labia. There is no tenderness or lesion on the left labia. Cervix exhibits discharge. Cervix exhibits no motion tenderness. No erythema, tenderness or bleeding in the vagina. Vaginal discharge found.  Genitourinary Comments: Copious amount of whitish, thick, clumpy discharge.  Skin: Skin is warm and dry.  Assessment & Plan:  Vaginal Itching/Discharge:  Present for 2-3 days. No urinary or abdominal symptoms. Exam today consistent for vaginal yeast infection, especially given recent treatment with metronidazole for BV. Will go ahead and treat with fluconazole tablet given discomfort. Wet Prep sent off for confirmation. She declines GC/Chlamydia testing as she had it several weeks ago. Urine pregnancy negative.  Follow up PRN. Morrie Sheldonlark,Brier [place]baugh Kendal, NP

## 2017-01-18 NOTE — Progress Notes (Signed)
Pre visit review using our clinic review tool, if applicable. No additional management support is needed unless otherwise documented below in the visit note. 

## 2017-01-18 NOTE — Addendum Note (Signed)
Addended by: Tawnya CrookSAMBATH, Trinten Boudoin on: 01/18/2017 10:07 AM   Modules accepted: Orders

## 2017-01-19 LAB — WET PREP BY MOLECULAR PROBE
Candida species: DETECTED — AB
GARDNERELLA VAGINALIS: DETECTED — AB
Trichomonas vaginosis: NOT DETECTED

## 2017-01-20 ENCOUNTER — Other Ambulatory Visit: Payer: Self-pay | Admitting: Primary Care

## 2017-01-20 DIAGNOSIS — N760 Acute vaginitis: Principal | ICD-10-CM

## 2017-01-20 DIAGNOSIS — B9689 Other specified bacterial agents as the cause of diseases classified elsewhere: Secondary | ICD-10-CM

## 2017-01-20 MED ORDER — METRONIDAZOLE 500 MG PO TABS
500.0000 mg | ORAL_TABLET | Freq: Two times a day (BID) | ORAL | 0 refills | Status: DC
Start: 2017-01-20 — End: 2017-02-11

## 2017-01-21 ENCOUNTER — Other Ambulatory Visit: Payer: Self-pay | Admitting: Primary Care

## 2017-01-21 DIAGNOSIS — N760 Acute vaginitis: Principal | ICD-10-CM

## 2017-01-21 DIAGNOSIS — B9689 Other specified bacterial agents as the cause of diseases classified elsewhere: Secondary | ICD-10-CM

## 2017-01-31 ENCOUNTER — Encounter: Payer: Self-pay | Admitting: Certified Nurse Midwife

## 2017-02-11 ENCOUNTER — Encounter: Payer: Self-pay | Admitting: Certified Nurse Midwife

## 2017-02-11 ENCOUNTER — Ambulatory Visit (INDEPENDENT_AMBULATORY_CARE_PROVIDER_SITE_OTHER): Payer: BLUE CROSS/BLUE SHIELD | Admitting: Certified Nurse Midwife

## 2017-02-11 VITALS — BP 123/73 | HR 83 | Ht 62.0 in | Wt 146.1 lb

## 2017-02-11 DIAGNOSIS — Z7689 Persons encountering health services in other specified circumstances: Secondary | ICD-10-CM

## 2017-02-11 NOTE — Patient Instructions (Signed)

## 2017-02-17 NOTE — Progress Notes (Signed)
GYN ENCOUNTER NOTE  Subjective:       Rebecca Rollins is a 26 y.o. G0P0000 female here for gynecologic evaluation of recurrent vaginal infections.   [name] comes here today after receiving treatment for BV and a yeast infection from her PCP.   Denies difficulty breathing or respiratory distress, chest pain, abdominal pain, dysuria, vaginal bleeding or discharge, and leg pain or swelling.    Gynecologic History  Patient's last menstrual period was 01/21/2017.  Contraception: condoms  Last Pap: 05/17/2015. Results were: normal  Obstetric History OB History  Gravida Para Term Preterm AB Living  0 0 0 0 0 0  SAB TAB Ectopic Multiple Live Births  0 0 0 0 0        Past Medical History:  Diagnosis Date  . Reflux     Past Surgical History:  Procedure Laterality Date  . MOUTH SURGERY  2011    No current outpatient prescriptions on file prior to visit.   No current facility-administered medications on file prior to visit.     Allergies  Allergen Reactions  . Penicillins     REACTION: ? Rash REACTION: ? Rash    Social History   Social History  . Marital status: Single    Spouse name: N/A  . Number of children: N/A  . Years of education: N/A   Occupational History  . Not on file.   Social History Main Topics  . Smoking status: Never Smoker  . Smokeless tobacco: Never Used  . Alcohol use 0.0 oz/week     Comment: occasinally, 2-3 drinks once every 6 months  . Drug use: No  . Sexual activity: Yes    Birth control/ protection: Condom   Other Topics Concern  . Not on file   Social History Narrative   11th grade at [employer]   As and Bs   Plans on college   Diet: some fruits and veggies   No sports   Works at [employer]   Exercise: only in gym   [employer] and phone   Sexually active...first at 88    Family History  Problem Relation Age of Onset  . Hypertension Mother   . Anemia Mother   . Diabetes Maternal Uncle   . Hypertension Maternal  Grandmother   . Hypertension Maternal Grandfather     The following portions of the patient's history were reviewed and updated as appropriate: allergies, current medications, past family history, past medical history, past social history, past surgical history and problem list.  Review of Systems  Review of Systems - Negative except as noted above History obtained from the patient  Objective:   BP 123/73 (BP Location: Left Arm, Patient Position: Sitting, Cuff Size: Normal)   Pulse 83   Ht  (1.575 m)   Wt 146 lb 1.6 oz (66.3 kg)   LMP 01/21/2017   BMI 26.72 kg/m    Alert and oriented x 4, no apparent distress.   Physical exam: not indicated.   Assessment:   1. Encounter to establish care  Plan:   Discussed vaginal health techniques   Reviewed red flag symptoms and when to call  RTC as needed   Gunnar Bulla, CNM

## 2017-03-14 ENCOUNTER — Encounter: Payer: Self-pay | Admitting: Family Medicine

## 2017-03-18 ENCOUNTER — Other Ambulatory Visit: Payer: Self-pay | Admitting: Family Medicine

## 2017-03-18 NOTE — Telephone Encounter (Signed)
Routing to PCP. I recommend evaluation, up to PCP on refill. May need to consider preventative treatment if she's experiencing frequent outbreaks.

## 2017-03-18 NOTE — Telephone Encounter (Signed)
Pt said she developed herpes outbreak 03/15/17; last seen for herpes 08/07/16; last acute 01/18/17; last refilled # 6  X 1 on 08/07/16. Due to work schedule pt cannot come in for appt. Pt request refill valacyclovir to [facility] [address]. Dr Milinda Antisower out of office until 03/20/17.Please advise.

## 2017-03-19 MED ORDER — VALACYCLOVIR HCL 500 MG PO TABS: 500.0000 mg | ORAL_TABLET | Freq: Two times a day (BID) | ORAL | 0 refills | Status: DC

## 2017-03-19 NOTE — Telephone Encounter (Signed)
Okay to refill but let pt know that if she has more than 5 outbreaks a year.. She can consider preventative medication. MAke appt if interested.

## 2017-03-19 NOTE — Telephone Encounter (Signed)
Ms. Rebecca Rollins notified as instructed by telephone.  Valacyclovir prescription sent into [facility] [address].  She does want to come to discuss preventative medication but will need to call back to make that appointment due to work schedule.

## 2017-03-21 ENCOUNTER — Ambulatory Visit: Payer: Self-pay | Admitting: Family Medicine

## 2017-04-19 ENCOUNTER — Ambulatory Visit: Payer: Federal, State, Local not specified - PPO | Admitting: Family Medicine

## 2017-04-26 ENCOUNTER — Ambulatory Visit: Payer: Federal, State, Local not specified - PPO | Admitting: Family Medicine

## 2017-04-26 ENCOUNTER — Ambulatory Visit (INDEPENDENT_AMBULATORY_CARE_PROVIDER_SITE_OTHER): Payer: Federal, State, Local not specified - PPO | Admitting: Primary Care

## 2017-04-26 ENCOUNTER — Encounter: Payer: Self-pay | Admitting: Primary Care

## 2017-04-26 VITALS — BP 114/70 | HR 90 | Temp 98.1°F | Wt 149.8 lb

## 2017-04-26 DIAGNOSIS — H6123 Impacted cerumen, bilateral: Secondary | ICD-10-CM | POA: Diagnosis not present

## 2017-04-26 DIAGNOSIS — L298 Other pruritus: Secondary | ICD-10-CM

## 2017-04-26 DIAGNOSIS — R1013 Epigastric pain: Secondary | ICD-10-CM

## 2017-04-26 DIAGNOSIS — N898 Other specified noninflammatory disorders of vagina: Secondary | ICD-10-CM

## 2017-04-26 LAB — COMPREHENSIVE METABOLIC PANEL
ALK PHOS: 60 U/L (ref 39–117)
ALT: 13 U/L (ref 0–35)
AST: 20 U/L (ref 0–37)
Albumin: 4.1 g/dL (ref 3.5–5.2)
BILIRUBIN TOTAL: 0.4 mg/dL (ref 0.2–1.2)
BUN: 8 mg/dL (ref 6–23)
CALCIUM: 9.9 mg/dL (ref 8.4–10.5)
CO2: 31 mEq/L (ref 19–32)
CREATININE: 0.76 mg/dL (ref 0.40–1.20)
Chloride: 102 mEq/L (ref 96–112)
GFR: 119.82 mL/min (ref >60.00)
GLUCOSE: 70 mg/dL (ref 70–99)
Potassium: 4.1 mEq/L (ref 3.5–5.1)
SODIUM: 138 meq/L (ref 135–145)
Total Protein: 7.5 g/dL (ref 6.0–8.3)

## 2017-04-26 LAB — H. PYLORI ANTIBODY, IGG: H PYLORI IGG: NEGATIVE

## 2017-04-26 LAB — CBC WITH DIFFERENTIAL/PLATELET
BASOS PCT: 0.5 % (ref 0.0–3.0)
Basophils Absolute: 0 10*3/uL (ref 0.0–0.1)
EOS ABS: 0.2 10*3/uL (ref 0.0–0.7)
EOS PCT: 3 % (ref 0.0–5.0)
HCT: 36 % (ref 36.0–46.0)
Hemoglobin: 11.5 g/dL — ABNORMAL LOW (ref 12.0–15.0)
LYMPHS ABS: 2.3 10*3/uL (ref 0.7–4.0)
Lymphocytes Relative: 36.9 % (ref 12.0–46.0)
MCHC: 31.9 g/dL (ref 30.0–36.0)
MCV: 80.9 fl (ref 78.0–100.0)
MONO ABS: 0.5 10*3/uL (ref 0.1–1.0)
Monocytes Relative: 7.9 % (ref 3.0–12.0)
NEUTROS ABS: 3.2 10*3/uL (ref 1.4–7.7)
NEUTROS PCT: 51.7 % (ref 43.0–77.0)
PLATELETS: 332 10*3/uL (ref 150.0–400.0)
RBC: 4.44 Mil/uL (ref 3.87–5.11)
RDW: 16.7 % — AB (ref 11.5–15.5)
WBC: 6.1 10*3/uL (ref 4.0–10.5)

## 2017-04-26 LAB — LIPASE: Lipase: 12 U/L (ref 11.0–59.0)

## 2017-04-26 MED ORDER — OMEPRAZOLE 40 MG PO CPDR: 40.0000 mg | DELAYED_RELEASE_CAPSULE | Freq: Every day | ORAL | 0 refills | Status: DC

## 2017-04-26 NOTE — Progress Notes (Signed)
Subjective:    Patient ID: Rebecca Rollins, female    DOB: 11/04/1991, 26 y.o.   MRN: [id number]  HPI  Rebecca Rollins is a 26 year old female with a history of GERD and HSVwho presents today with a chief complaint of abdominal pain.  Her pain is located to the epigastric region. Her pain began one month ago during her menstrual period, but wasn't a menstrual cramp. She took several OTC tablets without much improvement. Her pain has been intermittently present for the past one month, mostly instantly-30 minutes within eating, sometimes without eating.   She describes her pain as a sharp cramping sensation. She denies radiation of pain, nausea/vomiting, diarrhea, bloody stools. She has noticed constipation since her pain began and is having bowel movements daily to every other day. She was previously having bowel movements three times daily.  She does have a history of GERD and experiences symptoms of esophageal burning with certain foods. Her symptoms are dependant on her food choices. She's not avoiding those foods. She is not currently taking anything for her GERD.  2) Ear Fullness: Located to the right ear for several weeks. She denies pain, rhinorrhea, sinus pressure. She thinks she may have wax buildup.  3) Vaginal Discharge: Symptoms of thick, whitish discharge, vaginal itching x 3-4 days. She's not tried anything OTC for her symptoms. She denies urinary frequency, dysuria, hematuria.   Review of Systems  Constitutional: Negative for chills and fever.  Gastrointestinal: Positive for abdominal pain and constipation. Negative for blood in stool, diarrhea, nausea and vomiting.  Musculoskeletal: Negative for back pain.       Past Medical History:  Diagnosis Date  . Reflux      Social History   Social History  . Marital status: Single    Spouse name: N/A  . Number of children: N/A  . Years of education: N/A   Occupational History  . Not on file.   Social History Main Topics  .  Smoking status: Never Smoker  . Smokeless tobacco: Never Used  . Alcohol use 0.0 oz/week     Comment: occasinally, 2-3 drinks once every 6 months  . Drug use: No  . Sexual activity: Yes    Birth control/ protection: Condom   Other Topics Concern  . Not on file   Social History Narrative   11th grade at [employer]   As and Bs   Plans on college   Diet: some fruits and veggies   No sports   Works at [employer]   Exercise: only in gym   [employer] and phone   Sexually active...first at 71    Past Surgical History:  Procedure Laterality Date  . MOUTH SURGERY  2011    Family History  Problem Relation Age of Onset  . Hypertension Mother   . Anemia Mother   . Diabetes Maternal Uncle   . Hypertension Maternal Grandmother   . Hypertension Maternal Grandfather     Allergies  Allergen Reactions  . Penicillins     REACTION: ? Rash REACTION: ? Rash    Current Outpatient Prescriptions on File Prior to Visit  Medication Sig Dispense Refill  . valACYclovir (VALTREX) 500 MG tablet Take 1 tablet (500 mg total) by mouth 2 (two) times daily. (Patient not taking: Reported on 04/26/2017) 6 tablet 0   No current facility-administered medications on file prior to visit.     BP 114/70   Pulse 90   Temp 98.1 F (36.7 C) (  Oral)   Wt 149 lb 12.8 oz (67.9 kg)   LMP 04/16/2017   SpO2 98%   BMI 27.40 kg/m    Objective:   Physical Exam  Constitutional: She appears well-nourished.  HENT:  Right Ear: Tympanic membrane and ear canal normal.  Left Ear: Tympanic membrane and ear canal normal.  Bilateral canals with cerumen impaction. TM's and canals post irrigation unremarkable.  Neck: Neck supple.  Cardiovascular: Normal rate and regular rhythm.   Pulmonary/Chest: Effort normal and breath sounds normal.  Abdominal: Soft. Normal appearance and bowel sounds are normal. There is tenderness in the epigastric area and left upper quadrant. There is no rebound, no guarding and negative  Murphy's sign.  Genitourinary: There is no tenderness or lesion on the right labia. There is no tenderness or lesion on the left labia. Cervix exhibits discharge. Cervix exhibits no motion tenderness. Vaginal discharge found.  Genitourinary Comments: Whitish, thick discharge. Moderate amount.  Skin: Skin is warm and dry.          Assessment & Plan:  Abdominal Pain:  Located to the epigastric region x 1 month, intermittent. Exam today with epigastric tenderness and LUQ tenderness. Do not suspect acute appendicitis. Differentials include: GERD, H Pylori, ulcer, gall bladder involvement. Less likely acute pancreatitis. Will obtain CBC, CMP, Lipase, H Pylori today. Start omeprazole 40 mg once daily x 4 weeks.  Consider ultrasound to rule out gall bladder involvement if labs unremarkable. Discussed plan with patient, she verbalized understanding. She does not appear ill and is appropriate for outpatient treatment.  Vaginal Discharge:  Also with vaginal itching x 3-4 days. Exam today with moderate discharge as noted above. Wet prep completed and sent off for testing. Will await results.  Rebecca Sheldonlark,Raeshawn Vo Kendal, NP

## 2017-04-26 NOTE — Patient Instructions (Addendum)
Complete lab work prior to leaving today. I will notify you of your results once received.   Start omeprazole 40 mg capsules for abdominal pain. Take 1 capsule by mouth once daily. Do this for the entire 30 days for now.  Please go to the hospital if you develop fevers, your pain becomes worse, you start vomiting.  It was a pleasure to see you today!   Food Choices for Gastroesophageal Reflux Disease, Adult When you have gastroesophageal reflux disease (GERD), the foods you eat and your eating habits are very important. Choosing the right foods can help ease your discomfort. What guidelines do I need to follow?  Choose fruits, vegetables, whole grains, and low-fat dairy products.  Choose low-fat meat, fish, and poultry.  Limit fats such as oils, salad dressings, butter, nuts, and avocado.  Keep a food diary. This helps you identify foods that cause symptoms.  Avoid foods that cause symptoms. These may be different for everyone.  Eat small meals often instead of 3 large meals a day.  Eat your meals slowly, in a place where you are relaxed.  Limit fried foods.  Cook foods using methods other than frying.  Avoid drinking alcohol.  Avoid drinking large amounts of liquids with your meals.  Avoid bending over or lying down until 2-3 hours after eating. What foods are not recommended? These are some foods and drinks that may make your symptoms worse: Vegetables Tomatoes. Tomato juice. Tomato and spaghetti sauce. Chili peppers. Onion and garlic. Horseradish. Fruits Oranges, grapefruit, and lemon (fruit and juice). Meats High-fat meats, fish, and poultry. This includes hot dogs, ribs, ham, sausage, salami, and bacon. Dairy Whole milk and chocolate milk. Sour cream. Cream. Butter. Ice cream. Cream cheese. Drinks Coffee and tea. Bubbly (carbonated) drinks or energy drinks. Condiments Hot sauce. Barbecue sauce. Sweets/Desserts Chocolate and cocoa. Donuts. Peppermint and  spearmint. Fats and Oils High-fat foods. This includes [place] fries and potato chips. Other Vinegar. Strong spices. This includes black pepper, white pepper, red pepper, cayenne, curry powder, cloves, ginger, and chili powder. The items listed above may not be a complete list of foods and drinks to avoid. Contact your dietitian for more information. This information is not intended to replace advice given to you by your health care provider. Make sure you discuss any questions you have with your health care provider. Document Released: 04/22/2012 Document Revised: 03/29/2016 Document Reviewed: 08/26/2013 Elsevier Interactive Patient Education  2017 [employer]

## 2017-04-26 NOTE — Addendum Note (Signed)
Addended by: Tawnya CrookSAMBATH, Annlee Glandon on: 04/26/2017 01:06 PM   Modules accepted: Orders

## 2017-04-27 LAB — WET PREP BY MOLECULAR PROBE
Candida species: DETECTED — AB
GARDNERELLA VAGINALIS: NOT DETECTED
TRICHOMONAS VAG: NOT DETECTED

## 2017-04-28 ENCOUNTER — Other Ambulatory Visit: Payer: Self-pay | Admitting: Primary Care

## 2017-04-28 DIAGNOSIS — B373 Candidiasis of vulva and vagina: Secondary | ICD-10-CM

## 2017-04-28 DIAGNOSIS — B3731 Acute candidiasis of vulva and vagina: Secondary | ICD-10-CM

## 2017-04-28 MED ORDER — FLUCONAZOLE 150 MG PO TABS: 150.0000 mg | ORAL_TABLET | Freq: Once | ORAL | 0 refills | Status: AC

## 2017-04-30 ENCOUNTER — Encounter: Payer: Self-pay | Admitting: Family Medicine

## 2017-04-30 ENCOUNTER — Ambulatory Visit (INDEPENDENT_AMBULATORY_CARE_PROVIDER_SITE_OTHER): Payer: Federal, State, Local not specified - PPO | Admitting: Family Medicine

## 2017-04-30 ENCOUNTER — Encounter: Payer: Self-pay | Admitting: *Deleted

## 2017-04-30 DIAGNOSIS — J020 Streptococcal pharyngitis: Secondary | ICD-10-CM

## 2017-04-30 MED ORDER — AZITHROMYCIN 250 MG PO TABS: ORAL_TABLET | ORAL | 0 refills | Status: DC

## 2017-04-30 NOTE — Assessment & Plan Note (Signed)
PCN allergy  treat with azithromycin.  NSAIDs for pain and fever.  Push fluids.

## 2017-04-30 NOTE — Patient Instructions (Addendum)
Start ibuprofen 600-800 mg every eight hours for fever, body aches and sore throat.  Complete antibiotics.  Push fluids.  Rest.   Strep Throat Strep throat is a bacterial infection of the throat. Your health care provider may call the infection tonsillitis or pharyngitis, depending on whether there is swelling in the tonsils or at the back of the throat. Strep throat is most common during the cold months of the year in children who are 135-24 years of age, but it can happen during any season in people of any age. This infection is spread from person to person (contagious) through coughing, sneezing, or close contact. What are the causes? Strep throat is caused by the bacteria called Streptococcus pyogenes. What increases the risk? This condition is more likely to develop in:  People who spend time in crowded places where the infection can spread easily.  People who have close contact with someone who has strep throat.  What are the signs or symptoms? Symptoms of this condition include:  Fever or chills.  Redness, swelling, or pain in the tonsils or throat.  Pain or difficulty when swallowing.  White or yellow spots on the tonsils or throat.  Swollen, tender glands in the neck or under the jaw.  Red rash all over the body (rare).  How is this diagnosed? This condition is diagnosed by performing a rapid strep test or by taking a swab of your throat (throat culture test). Results from a rapid strep test are usually ready in a few minutes, but throat culture test results are available after one or two days. How is this treated? This condition is treated with antibiotic medicine. Follow these instructions at home: Medicines  Take over-the-counter and prescription medicines only as told by your health care provider.  Take your antibiotic as told by your health care provider. Do not stop taking the antibiotic even if you start to feel better.  Have family members who also have a  sore throat or fever tested for strep throat. They may need antibiotics if they have the strep infection. Eating and drinking  Do not share food, drinking cups, or personal items that could cause the infection to spread to other people.  If swallowing is difficult, try eating soft foods until your sore throat feels better.  Drink enough fluid to keep your urine clear or pale yellow. General instructions  Gargle with a salt-water mixture 3-4 times per day or as needed. To make a salt-water mixture, completely dissolve -1 tsp of salt in 1 cup of warm water.  Make sure that all household members wash their hands well.  Get plenty of rest.  Stay home from school or work until you have been taking antibiotics for 24 hours.  Keep all follow-up visits as told by your health care provider. This is important. Contact a health care provider if:  The glands in your neck continue to get bigger.  You develop a rash, cough, or earache.  You cough up a thick liquid that is green, yellow-brown, or bloody.  You have pain or discomfort that does not get better with medicine.  Your problems seem to be getting worse rather than better.  You have a fever. Get help right away if:  You have new symptoms, such as vomiting, severe headache, stiff or painful neck, chest pain, or shortness of breath.  You have severe throat pain, drooling, or changes in your voice.  You have swelling of the neck, or the skin on the neck  becomes red and tender.  You have signs of dehydration, such as fatigue, dry mouth, and decreased urination.  You become increasingly sleepy, or you cannot wake up completely.  Your joints become red or painful. This information is not intended to replace advice given to you by your health care provider. Make sure you discuss any questions you have with your health care provider. Document Released: 10/19/2000 Document Revised: 06/20/2016 Document Reviewed: 02/14/2015 Elsevier  Interactive Patient Education  2017 [employer].

## 2017-04-30 NOTE — Progress Notes (Signed)
   Subjective:    Patient ID: Rebecca Rollins, female    DOB: 12-13-1990, 26 y.o.   MRN: [id number]  HPI    26 year old female presents with new onset  Fever and sore throat x 24 hours.  Sudden onset body ache, chills.  Sever sore throat. Decreased appetite.  Low back pain. No cough, no congestion.  No SOB, no wheeze.  Slight nausea, no abd pain.  Taking in some water. nml UOP. Taking tylenol for fever.. Did not help.   She works with [employer]. No specific contacts.  Blood pressure 114/64, pulse (!) 117, temperature (!) 101.9 F (38.8 C), temperature source Oral, weight 148 lb 12 oz (67.5 kg), last menstrual period 04/16/2017, SpO2 99 %.  Review of Systems  Constitutional: Positive for fatigue and fever.  HENT: Positive for sore throat. Negative for ear pain.   Eyes: Negative for pain.  Respiratory: Negative for chest tightness and shortness of breath.   Cardiovascular: Negative for chest pain, palpitations and leg swelling.  Gastrointestinal: Negative for abdominal pain.  Genitourinary: Negative for dysuria.       Objective:   Physical Exam  Constitutional: Vital signs are normal. She appears well-developed and well-nourished. She is cooperative.  Non-toxic appearance. She does not appear ill. No distress.  HENT:  Head: Normocephalic.  Right Ear: Hearing, tympanic membrane, external ear and ear canal normal. Tympanic membrane is not erythematous, not retracted and not bulging.  Left Ear: Hearing, tympanic membrane, external ear and ear canal normal. Tympanic membrane is not erythematous, not retracted and not bulging.  Nose: No mucosal edema or rhinorrhea. Right sinus exhibits no maxillary sinus tenderness and no frontal sinus tenderness. Left sinus exhibits no maxillary sinus tenderness and no frontal sinus tenderness.  Mouth/Throat: Uvula is midline and mucous membranes are normal. Posterior oropharyngeal erythema present. No oropharyngeal exudate, posterior oropharyngeal edema  or tonsillar abscesses.  Eyes: Conjunctivae, EOM and lids are normal. Pupils are equal, round, and reactive to light. Lids are everted and swept, no foreign bodies found.  Neck: Trachea normal and normal range of motion. Neck supple. Carotid bruit is not present. No thyroid mass and no thyromegaly present.  Cardiovascular: Normal rate, regular rhythm, S1 normal, S2 normal, normal heart sounds, intact distal pulses and normal pulses.  Exam reveals no gallop and no friction rub.   No murmur heard. Pulmonary/Chest: Effort normal and breath sounds normal. No tachypnea. No respiratory distress. She has no decreased breath sounds. She has no wheezes. She has no rhonchi. She has no rales.  Neurological: She is alert.  Skin: Skin is warm, dry and intact. No rash noted.  Psychiatric: Her speech is normal and behavior is normal. Judgment normal. Her mood appears not anxious. Cognition and memory are normal. She does not exhibit a depressed mood.          Assessment & Plan:

## 2017-06-03 ENCOUNTER — Other Ambulatory Visit: Payer: Self-pay | Admitting: Family Medicine

## 2017-06-03 NOTE — Telephone Encounter (Addendum)
Last office visit 04/30/2017.  Not on current medication list.  Refill?

## 2017-06-24 ENCOUNTER — Telehealth: Payer: Self-pay | Admitting: Family Medicine

## 2017-06-24 DIAGNOSIS — Z3490 Encounter for supervision of normal pregnancy, unspecified, unspecified trimester: Secondary | ICD-10-CM

## 2017-06-24 NOTE — Telephone Encounter (Signed)
Patient called and left message asking for referral to Gyn. Patient states she has taken 4 home pregnancy tests and all were positive. Asks to be sent to Dr. Vincente Poli in [place]

## 2017-07-12 ENCOUNTER — Other Ambulatory Visit: Payer: Self-pay | Admitting: Family Medicine

## 2017-07-12 NOTE — Telephone Encounter (Signed)
Last office visit 04/30/2017.  Last refilled 06/04/2017 for #6 with no refills.  Ok to refill?

## 2017-07-15 ENCOUNTER — Ambulatory Visit: Payer: Federal, State, Local not specified - PPO | Admitting: Certified Nurse Midwife

## 2017-07-15 VITALS — Ht 62.0 in | Wt 154.8 lb

## 2017-07-15 DIAGNOSIS — Z3A09 9 weeks gestation of pregnancy: Secondary | ICD-10-CM

## 2017-07-15 NOTE — Patient Instructions (Signed)
First Trimester of Pregnancy The first trimester of pregnancy is from week 1 until the end of week 13 (months 1 through 3). During this time, your baby will begin to develop inside you. At 6-8 weeks, the eyes and face are formed, and the heartbeat can be seen on ultrasound. At the end of 12 weeks, all the baby's organs are formed. Prenatal care is all the medical care you receive before the birth of your baby. Make sure you get good prenatal care and follow all of your doctor's instructions. Follow these instructions at home: Medicines  Take over-the-counter and prescription medicines only as told by your doctor. Some medicines are safe and some medicines are not safe during pregnancy.  Take a prenatal vitamin that contains at least 600 micrograms (mcg) of folic acid.  If you have trouble pooping (constipation), take medicine that will make your stool soft (stool softener) if your doctor approves. Eating and drinking  Eat regular, healthy meals.  Your doctor will tell you the amount of weight gain that is right for you.  Avoid raw meat and uncooked cheese.  If you feel sick to your stomach (nauseous) or throw up (vomit): ? Eat 4 or 5 small meals a day instead of 3 large meals. ? Try eating a few soda crackers. ? Drink liquids between meals instead of during meals.  To prevent constipation: ? Eat foods that are high in fiber, like fresh fruits and vegetables, whole grains, and beans. ? Drink enough fluids to keep your pee (urine) clear or pale yellow. Activity  Exercise only as told by your doctor. Stop exercising if you have cramps or pain in your lower belly (abdomen) or low back.  Do not exercise if it is too hot, too humid, or if you are in a place of great height (high altitude).  Try to avoid standing for long periods of time. Move your legs often if you must stand in one place for a long time.  Avoid heavy lifting.  Wear low-heeled shoes. Sit and stand up straight.  You  can have sex unless your doctor tells you not to. Relieving pain and discomfort  Wear a good support bra if your breasts are sore.  Take warm water baths (sitz baths) to soothe pain or discomfort caused by hemorrhoids. Use hemorrhoid cream if your doctor says it is okay.  Rest with your legs raised if you have leg cramps or low back pain.  If you have puffy, bulging veins (varicose veins) in your legs: ? Wear support hose or compression stockings as told by your doctor. ? Raise (elevate) your feet for 15 minutes, 3-4 times a day. ? Limit salt in your food. Prenatal care  Schedule your prenatal visits by the twelfth week of pregnancy.  Write down your questions. Take them to your prenatal visits.  Keep all your prenatal visits as told by your doctor. This is important. Safety  Wear your seat belt at all times when driving.  Make a list of emergency phone numbers. The list should include numbers for family, friends, the hospital, and police and fire departments. General instructions  Ask your doctor for a referral to a local prenatal class. Begin classes no later than at the start of month 6 of your pregnancy.  Ask for help if you need counseling or if you need help with nutrition. Your doctor can give you advice or tell you where to go for help.  Do not use hot tubs, steam rooms, or  saunas.  Do not douche or use tampons or scented sanitary pads.  Do not cross your legs for long periods of time.  Avoid all herbs and alcohol. Avoid drugs that are not approved by your doctor.  Do not use any tobacco products, including cigarettes, chewing tobacco, and electronic cigarettes. If you need help quitting, ask your doctor. You may get counseling or other support to help you quit.  Avoid cat litter boxes and soil used by cats. These carry germs that can cause birth defects in the baby and can cause a loss of your baby (miscarriage) or stillbirth.  Visit your dentist. At home, brush  your teeth with a soft toothbrush. Be gentle when you floss. Contact a doctor if:  You are dizzy.  You have mild cramps or pressure in your lower belly.  You have a nagging pain in your belly area.  You continue to feel sick to your stomach, you throw up, or you have watery poop (diarrhea).  You have a bad smelling fluid coming from your vagina.  You have pain when you pee (urinate).  You have increased puffiness (swelling) in your face, hands, legs, or ankles. Get help right away if:  You have a fever.  You are leaking fluid from your vagina.  You have spotting or bleeding from your vagina.  You have very bad belly cramping or pain.  You gain or lose weight rapidly.  You throw up blood. It may look like coffee grounds.  You are around people who have [place] measles, fifth disease, or chickenpox.  You have a very bad headache.  You have shortness of breath.  You have any kind of trauma, such as from a fall or a car accident. Summary  The first trimester of pregnancy is from week 1 until the end of week 13 (months 1 through 3).  To take care of yourself and your unborn baby, you will need to eat healthy meals, take medicines only if your doctor tells you to do so, and do activities that are safe for you and your baby.  Keep all follow-up visits as told by your doctor. This is important as your doctor will have to ensure that your baby is healthy and growing well. This information is not intended to replace advice given to you by your health care provider. Make sure you discuss any questions you have with your health care provider. Document Released: 04/09/2008 Document Revised: 10/30/2016 Document Reviewed: 10/30/2016 Elsevier Interactive Patient Education  2017 [place]. Common Medications Safe in Pregnancy  Acne:      Constipation:  Benzoyl Peroxide     Colace  Clindamycin      Dulcolax Suppository  Topica Erythromycin     Fibercon  Salicylic  Acid      Metamucil         Miralax AVOID:        Senakot   Accutane    Cough:  Retin-A       Cough Drops  Tetracycline      Phenergan w/ Codeine if Rx  Minocycline      Robitussin (Plain & DM)  Antibiotics:     Crabs/Lice:  Ceclor       RID  Cephalosporins    AVOID:  E-Mycins      Kwell  Keflex  Macrobid/Macrodantin   Diarrhea:  Penicillin      Kao-Pectate  Zithromax      Imodium AD         PUSH  FLUIDS AVOID:       Cipro     Fever:  Tetracycline      Tylenol (Regular or Extra  Minocycline       Strength)  Levaquin      Extra Strength-Do not          Exceed 8 tabs/24 hrs Caffeine:        <200mg/day (equiv. To 1 cup of coffee or  approx. 3 12 oz sodas)         Gas: Cold/Hayfever:       Gas-X  Benadryl      Mylicon  Claritin       Phazyme  **Claritin-D        Chlor-Trimeton    Headaches:  Dimetapp      ASA-Free Excedrin  Drixoral-Non-Drowsy     Cold Compress  Mucinex (Guaifenasin)     Tylenol (Regular or Extra  Sudafed/Sudafed-12 Hour     Strength)  **Sudafed PE Pseudoephedrine   Tylenol Cold & Sinus     Vicks Vapor Rub  Zyrtec  **AVOID if Problems With Blood Pressure         Heartburn: Avoid lying down for at least 1 hour after meals  Aciphex      Maalox     Rash:  Milk of Magnesia     Benadryl    Mylanta       1% Hydrocortisone Cream  Pepcid  Pepcid Complete   Sleep Aids:  Prevacid      Ambien   Prilosec       Benadryl  Rolaids       Chamomile Tea  Tums (Limit 4/day)     Unisom  Zantac       Tylenol PM         Warm milk-add vanilla or  Hemorrhoids:       Sugar for taste  Anusol/Anusol H.C.  (RX: Analapram 2.5%)  Sugar Substitutes:  Hydrocortisone OTC     Ok in moderation  Preparation H      Tucks        Vaseline lotion applied to tissue with wiping    Herpes:     Throat:  Acyclovir      Oragel  Famvir  Valtrex     Vaccines:         Flu Shot Leg Cramps:       *Gardasil  Benadryl      Hepatitis A         Hepatitis B Nasal  Spray:       Pneumovax  Saline Nasal Spray     Polio Booster         Tetanus Nausea:       Tuberculosis test or PPD  Vitamin B6 25 mg TID   AVOID:    Dramamine      *Gardasil  Emetrol       Live Poliovirus  Ginger Root 250 mg QID    MMR (measles, mumps &  High Complex Carbs @ Bedtime    rebella)  Sea Bands-Accupressure    Varicella (Chickenpox)  Unisom 1/2 tab TID     *No known complications           If received before Pain:         Known pregnancy;   Darvocet       Resume series after  Lortab        Delivery  Percocet    Yeast:   Tramadol        Femstat  Tylenol 3      Gyne-lotrimin  Ultram       Monistat  Vicodin           MISC:         All Sunscreens           Hair Coloring/highlights          Insect Repellant's          (Including DEET)         Mystic Tans  

## 2017-07-15 NOTE — Progress Notes (Signed)
Rebecca Rollins presents for NOB nurse interview visit. Pregnancy confirmation done at [facility].  G- 1.  P- . Pregnancy education material explained and given. _No__ cats in the home. NOB labs ordered. (sickle cell). HIV labs and Drug screen were explained optional and she did not decline. Drug screen ordered PNV encouraged. Genetic screening options discussed. Genetic testing  Pt may discuss with provider. Pt. To follow up with provider in _3_ weeks for NOB physical.  All questions answered.

## 2017-07-16 LAB — CBC WITH DIFFERENTIAL/PLATELET
BASOS ABS: 0 10*3/uL (ref 0.0–0.2)
BASOS: 0 %
EOS (ABSOLUTE): 0.2 10*3/uL (ref 0.0–0.4)
Eos: 3 %
Hematocrit: 35.8 % (ref 34.0–46.6)
Hemoglobin: 11.3 g/dL (ref 11.1–15.9)
IMMATURE GRANS (ABS): 0 10*3/uL (ref 0.0–0.1)
Immature Granulocytes: 0 %
LYMPHS ABS: 2.1 10*3/uL (ref 0.7–3.1)
LYMPHS: 35 %
MCH: 25.3 pg — AB (ref 26.6–33.0)
MCHC: 31.6 g/dL (ref 31.5–35.7)
MCV: 80 fL (ref 79–97)
Monocytes Absolute: 0.4 10*3/uL (ref 0.1–0.9)
Monocytes: 7 %
NEUTROS ABS: 3.3 10*3/uL (ref 1.4–7.0)
Neutrophils: 55 %
Platelets: 334 10*3/uL (ref 150–379)
RBC: 4.47 x10E6/uL (ref 3.77–5.28)
RDW: 16.2 % — ABNORMAL HIGH (ref 12.3–15.4)
WBC: 6.1 10*3/uL (ref 3.4–10.8)

## 2017-07-16 LAB — ABO AND RH: RH TYPE: POSITIVE

## 2017-07-16 LAB — RPR: RPR: NONREACTIVE

## 2017-07-16 LAB — RUBELLA SCREEN

## 2017-07-16 LAB — VARICELLA ZOSTER ANTIBODY, IGG: VARICELLA: 342 {index} (ref >165)

## 2017-07-16 LAB — SICKLE CELL SCREEN: SICKLE CELL SCREEN: NEGATIVE

## 2017-07-16 LAB — HEPATITIS B SURFACE ANTIGEN: HEP B S AG: NEGATIVE

## 2017-07-16 LAB — HIV ANTIBODY (ROUTINE TESTING W REFLEX): HIV Screen 4th Generation wRfx: NONREACTIVE

## 2017-07-16 LAB — ANTIBODY SCREEN: ANTIBODY SCREEN: NEGATIVE

## 2017-07-17 ENCOUNTER — Other Ambulatory Visit: Payer: Self-pay | Admitting: Obstetrics and Gynecology

## 2017-07-17 DIAGNOSIS — Z2839 Other underimmunization status: Secondary | ICD-10-CM | POA: Insufficient documentation

## 2017-07-17 DIAGNOSIS — Z283 Underimmunization status: Secondary | ICD-10-CM

## 2017-07-17 DIAGNOSIS — O9989 Other specified diseases and conditions complicating pregnancy, childbirth and the puerperium: Principal | ICD-10-CM

## 2017-07-17 DIAGNOSIS — O09899 Supervision of other high risk pregnancies, unspecified trimester: Secondary | ICD-10-CM | POA: Insufficient documentation

## 2017-07-17 LAB — GC/CHLAMYDIA PROBE AMP
Chlamydia trachomatis, NAA: NEGATIVE
NEISSERIA GONORRHOEAE BY PCR: NEGATIVE

## 2017-07-17 LAB — URINE CULTURE: ORGANISM ID, BACTERIA: NO GROWTH

## 2017-07-18 ENCOUNTER — Other Ambulatory Visit: Payer: Self-pay | Admitting: Obstetrics and Gynecology

## 2017-07-18 DIAGNOSIS — F1290 Cannabis use, unspecified, uncomplicated: Secondary | ICD-10-CM

## 2017-07-18 LAB — MONITOR DRUG PROFILE 14(MW)
AMPHETAMINE SCREEN URINE: NEGATIVE ng/mL
BARBITURATE SCREEN URINE: NEGATIVE ng/mL
BENZODIAZEPINE SCREEN, URINE: NEGATIVE ng/mL
Buprenorphine, Urine: NEGATIVE ng/mL
COCAINE(METAB.)SCREEN, URINE: NEGATIVE ng/mL
Creatinine(Crt), U: 152.2 mg/dL (ref 20.0–300.0)
FENTANYL, URINE: NEGATIVE pg/mL
Meperidine Screen, Urine: NEGATIVE ng/mL
Methadone Screen, Urine: NEGATIVE ng/mL
OPIATE SCREEN URINE: NEGATIVE ng/mL
OXYCODONE+OXYMORPHONE UR QL SCN: NEGATIVE ng/mL
PHENCYCLIDINE QUANTITATIVE URINE: NEGATIVE ng/mL
PROPOXYPHENE SCREEN URINE: NEGATIVE ng/mL
Ph of Urine: 5.9 (ref 4.5–8.9)
SPECIFIC GRAVITY: 1.03
Tramadol Screen, Urine: NEGATIVE ng/mL

## 2017-07-18 LAB — MICROSCOPIC EXAMINATION: Casts: NONE SEEN /lpf

## 2017-07-18 LAB — URINALYSIS, ROUTINE W REFLEX MICROSCOPIC
Bilirubin, UA: NEGATIVE
GLUCOSE, UA: NEGATIVE
Ketones, UA: NEGATIVE
Nitrite, UA: NEGATIVE
PROTEIN UA: NEGATIVE
RBC UA: NEGATIVE
Specific Gravity, UA: 1.021 (ref 1.005–1.030)
UUROB: 0.2 mg/dL (ref 0.2–1.0)
pH, UA: 6 (ref 5.0–7.5)

## 2017-07-18 LAB — CANNABINOID (GC/MS), URINE
CANNABINOID UR: POSITIVE — AB
Carboxy THC (GC/MS): 83 ng/mL

## 2017-07-22 NOTE — Progress Notes (Signed)
I have reviewed the record and concur with patient management and plan.    Keano Guggenheim Michelle Sheritha Louis, CNM [facility] 

## 2017-07-29 ENCOUNTER — Telehealth: Payer: Self-pay | Admitting: Certified Nurse Midwife

## 2017-07-29 NOTE — Telephone Encounter (Signed)
OB 11 weeks. For the past 4 days she has had diarrhea and constipation on/off. Pos for stomach pains. Vomit x1. No appetite. Feels better today. Pos for eating and drinking this am. NO fevers. No issues urinating. No new meds or foods. ? Stomach bug. Advised brat diet x 24 hours. Push fluids- Gatorade, sprite, ginger ale. If sx worsen to contact office for appt.

## 2017-07-29 NOTE — Telephone Encounter (Signed)
Patient called stating she has been having some weird stomach pains at night but she states it may just be gas. She would like a call back to discuss it. Thanks

## 2017-08-05 ENCOUNTER — Encounter: Payer: Self-pay | Admitting: Certified Nurse Midwife

## 2017-08-05 ENCOUNTER — Ambulatory Visit (INDEPENDENT_AMBULATORY_CARE_PROVIDER_SITE_OTHER): Payer: Federal, State, Local not specified - PPO | Admitting: Certified Nurse Midwife

## 2017-08-05 VITALS — BP 103/55 | Wt 155.0 lb

## 2017-08-05 DIAGNOSIS — O26891 Other specified pregnancy related conditions, first trimester: Secondary | ICD-10-CM

## 2017-08-05 DIAGNOSIS — O219 Vomiting of pregnancy, unspecified: Secondary | ICD-10-CM

## 2017-08-05 DIAGNOSIS — K219 Gastro-esophageal reflux disease without esophagitis: Secondary | ICD-10-CM

## 2017-08-05 DIAGNOSIS — N898 Other specified noninflammatory disorders of vagina: Secondary | ICD-10-CM

## 2017-08-05 DIAGNOSIS — Z3401 Encounter for supervision of normal first pregnancy, first trimester: Secondary | ICD-10-CM | POA: Diagnosis not present

## 2017-08-05 DIAGNOSIS — O99619 Diseases of the digestive system complicating pregnancy, unspecified trimester: Secondary | ICD-10-CM

## 2017-08-05 LAB — POCT URINALYSIS DIPSTICK
BILIRUBIN UA: NEGATIVE
Blood, UA: NEGATIVE
GLUCOSE UA: NEGATIVE
Ketones, UA: NEGATIVE
Nitrite, UA: NEGATIVE
SPEC GRAV UA: 1.015 (ref 1.010–1.025)
UROBILINOGEN UA: 0.2 U/dL
pH, UA: 6.5 (ref 5.0–8.0)

## 2017-08-05 MED ORDER — DOXYLAMINE-PYRIDOXINE 10-10 MG PO TBEC: 10.0000 mg | DELAYED_RELEASE_TABLET | Freq: Every day | ORAL | 1 refills | Status: DC

## 2017-08-05 MED ORDER — ONDANSETRON 4 MG PO TBDP: 4.0000 mg | ORAL_TABLET | Freq: Three times a day (TID) | ORAL | 0 refills | Status: DC | PRN

## 2017-08-05 MED ORDER — PANTOPRAZOLE SODIUM 20 MG PO TBEC
20.0000 mg | DELAYED_RELEASE_TABLET | Freq: Every day | ORAL | 6 refills | Status: DC
Start: 2017-08-05 — End: 2017-09-02

## 2017-08-05 NOTE — Progress Notes (Signed)
NEW OB HISTORY AND PHYSICAL  SUBJECTIVE:       Rebecca Rollins is a 26 y.o. [id number] female, Patient's last menstrual period was 05/13/2017., Estimated Date of Delivery: 02/17/18, [redacted]w[redacted]d, presents today for establishment of Prenatal Care.  She endorses occasional nausea with vomiting, increased reflux, increased vaginal discharge, and breast tenderness.   Denies difficulty breathing or respiratory distress, chest pain, abdominal pain, vaginal bleeding, dysuria, and leg pain or swelling.   Unsure about genetic screening at this time.    Gynecologic History  Patient's last menstrual period was 05/13/2017.   Last Pap: 2017. Results were: normal  Obstetric History  OB History  Gravida Para Term Preterm AB Living  1 0 0 0 0 0  SAB TAB Ectopic Multiple Live Births  0 0 0 0 0    # Outcome Date GA Lbr Len/2nd Weight Sex Delivery Anes PTL Lv  1 Current               Past Medical History:  Diagnosis Date  . Reflux     Past Surgical History:  Procedure Laterality Date  . MOUTH SURGERY  2011    Current Outpatient Prescriptions on File Prior to Visit  Medication Sig Dispense Refill  . Prenatal Vit-Fe Fumarate-FA (PRENATAL MULTIVITAMIN) TABS tablet Take 1 tablet by mouth daily at 12 noon.     No current facility-administered medications on file prior to visit.     Allergies  Allergen Reactions  . Penicillins     REACTION: ? Rash REACTION: ? Rash    Social History   Social History  . Marital status: Single    Spouse name: N/A  . Number of children: N/A  . Years of education: N/A   Occupational History  . Not on file.   Social History Main Topics  . Smoking status: Never Smoker  . Smokeless tobacco: Never Used  . Alcohol use 0.0 oz/week     Comment: occasinally, 2-3 drinks once every 6 months  . Drug use: No  . Sexual activity: Yes   Other Topics Concern  . Not on file   Social History Narrative   11th grade at [employer]   As and Bs   Plans on  college   Diet: some fruits and veggies   No sports   Works at [employer]   Exercise: only in gym   [employer] and phone   Sexually active...first at 13    Family History  Problem Relation Age of Onset  . Hypertension Mother   . Anemia Mother   . Diabetes Maternal Uncle   . Hypertension Maternal Grandmother   . Hypertension Maternal Grandfather     The following portions of the patient's history were reviewed and updated as appropriate: allergies, current medications, past OB history, past medical history, past surgical history, past family history, past social history, and problem list.    OBJECTIVE: Initial Physical Exam (New OB)  GENERAL APPEARANCE: alert, well appearing, in no apparent distress  HEAD: normocephalic, atraumatic  MOUTH: mucous membranes moist, pharynx normal without lesions and dental hygiene good  THYROID: no thyromegaly or masses present  BREASTS: no masses noted, no significant tenderness, no palpable axillary nodes, no skin changes  LUNGS: clear to auscultation, no wheezes, rales or rhonchi, symmetric air entry  HEART: regular rate and rhythm, no murmurs  ABDOMEN: soft, nontender, nondistended, no abnormal masses, no epigastric pain, fundus not palpable and FHT present  EXTREMITIES: no redness or tenderness in the calves  or thighs, no edema  SKIN: normal coloration and turgor, no rashes  LYMPH NODES: no adenopathy palpable  NEUROLOGIC: alert, oriented, normal speech, no focal findings or movement disorder noted  PELVIC EXAM EXTERNAL GENITALIA: normal appearing vulva with no masses, tenderness or lesions VAGINA: no abnormal discharge or lesions and discharge thick, white present CERVIX: no lesions or cervical motion tenderness UTERUS: gravid and consistent with 12 weeks ADNEXA: no masses palpable and nontender OB EXAM PELVIMETRY: appears adequate  ASSESSMENT: Normal pregnancy Vaginal discharge in pregnancy Nausea/Vomiting pregnancy GERD in  pregnancy  PLAN: Prenatal care Rx: Diclegis, Zofran, Protonix New OB counseling: The patient has been given an overview regarding routine prenatal care. Recommendations regarding diet, weight gain, and exercise in pregnancy were given. Prenatal testing, optional genetic testing, and ultrasound use in pregnancy were reviewed.  Benefits of Breast Feeding were discussed. The patient is encouraged to consider nursing her baby post partum. See orders   Gunnar Bulla, CNM

## 2017-08-05 NOTE — Progress Notes (Signed)
NOB PE- 

## 2017-08-05 NOTE — Patient Instructions (Signed)
Eating Plan for Pregnant Women While you are pregnant, your body will require additional nutrition to help support your growing baby. It is recommended that you consume:  150 additional calories each day during your first trimester.  300 additional calories each day during your second trimester.  300 additional calories each day during your third trimester.  Eating a healthy, well-balanced diet is very important for your health and for your baby's health. You also have a higher need for some vitamins and minerals, such as folic acid, calcium, iron, and vitamin D. What do I need to know about eating during pregnancy?  Do not try to lose weight or go on a diet during pregnancy.  Choose healthy, nutritious foods. Choose  of a sandwich with a glass of milk instead of a candy bar or a high-calorie sugar-sweetened beverage.  Limit your overall intake of foods that have "empty calories." These are foods that have little nutritional value, such as sweets, desserts, candies, sugar-sweetened beverages, and fried foods.  Eat a variety of foods, especially fruits and vegetables.  Take a prenatal vitamin to help meet the additional needs during pregnancy, specifically for folic acid, iron, calcium, and vitamin D.  Remember to stay active. Ask your health care provider for exercise recommendations that are specific to you.  Practice good food safety and cleanliness, such as washing your hands before you eat and after you prepare raw meat. This helps to prevent foodborne illnesses, such as listeriosis, that can be very dangerous for your baby. Ask your health care provider for more information about listeriosis. What does 150 extra calories look like? Healthy options for an additional 150 calories each day could be any of the following:  Plain low-fat yogurt (6-8 oz) with  cup of berries.  1 apple with 2 teaspoons of peanut butter.  Cut-up vegetables with  cup of hummus.  Low-fat chocolate milk  (8 oz or 1 cup).  1 string cheese with 1 medium orange.   of a peanut butter and jelly sandwich on whole-wheat bread (1 tsp of peanut butter).  For 300 calories, you could eat two of those healthy options each day. What is a healthy amount of weight to gain? The recommended amount of weight for you to gain is based on your pre-pregnancy BMI. If your pre-pregnancy BMI was:  Less than 18 (underweight), you should gain 28-40 lb.  18-24.9 (normal), you should gain 25-35 lb.  25-29.9 (overweight), you should gain 15-25 lb.  Greater than 30 (obese), you should gain 11-20 lb.  What if I am having twins or multiples? Generally, pregnant women who will be having twins or multiples may need to increase their daily calories by 300-600 calories each day. The recommended range for total weight gain is 25-54 lb, depending on your pre-pregnancy BMI. Talk with your health care provider for specific guidance about additional nutritional needs, weight gain, and exercise during your pregnancy. What foods can I eat? Grains Any grains. Try to choose whole grains, such as whole-wheat bread, oatmeal, or brown rice. Vegetables Any vegetables. Try to eat a variety of colors and types of vegetables to get a full range of vitamins and minerals. Remember to wash your vegetables well before eating. Fruits Any fruits. Try to eat a variety of colors and types of fruit to get a full range of vitamins and minerals. Remember to wash your fruits well before eating. Meats and Other Protein Sources Lean meats, including chicken, [place], fish, and lean cuts of beef, veal,  or pork. Make sure that all meats are cooked to "well done." Tofu. Tempeh. Beans. Eggs. Peanut butter and other nut butters. Seafood, such as shrimp, crab, and lobster. If you choose fish, select types that are higher in omega-3 fatty acids, including salmon, herring, mussels, trout, sardines, and pollock. Make sure that all meats are cooked to food-safe  temperatures. Dairy Pasteurized milk and milk alternatives. Pasteurized yogurt and pasteurized cheese. Cottage cheese. Sour cream. Beverages Water. Juices that contain 100% fruit juice or vegetable juice. Caffeine-free teas and decaffeinated coffee. Drinks that contain caffeine are okay to drink, but it is better to avoid caffeine. Keep your total caffeine intake to less than 200 mg each day (12 oz of coffee, tea, or soda) or as directed by your health care provider. Condiments Any pasteurized condiments. Sweets and Desserts Any sweets and desserts. Fats and Oils Any fats and oils. The items listed above may not be a complete list of recommended foods or beverages. Contact your dietitian for more options. What foods are not recommended? Vegetables Unpasteurized (raw) vegetable juices. Fruits Unpasteurized (raw) fruit juices. Meats and Other Protein Sources Cured meats that have nitrates, such as bacon, salami, and hotdogs. Luncheon meats, bologna, or other deli meats (unless they are reheated until they are steaming hot). Refrigerated pate, meat spreads from a meat counter, smoked seafood that is found in the refrigerated section of a store. Raw fish, such as sushi or sashimi. High mercury content fish, such as tilefish, shark, swordfish, and king mackerel. Raw meats, such as tuna or beef tartare. Undercooked meats and poultry. Make sure that all meats are cooked to food-safe temperatures. Dairy Unpasteurized (raw) milk and any foods that have raw milk in them. Soft cheeses, such as feta, queso blanco, queso fresco, Brie, Camembert cheeses, blue-veined cheeses, and Panela cheese (unless it is made with pasteurized milk, which must be stated on the label). Beverages Alcohol. Sugar-sweetened beverages, such as sodas, teas, or energy drinks. Condiments Homemade fermented foods and drinks, such as pickles, sauerkraut, or kombucha drinks. (Store-bought pasteurized versions of these are  okay.) Other Salads that are made in the store, such as ham salad, chicken salad, egg salad, tuna salad, and seafood salad. The items listed above may not be a complete list of foods and beverages to avoid. Contact your dietitian for more information. This information is not intended to replace advice given to you by your health care provider. Make sure you discuss any questions you have with your health care provider. Document Released: 08/06/2014 Document Revised: 03/29/2016 Document Reviewed: 04/06/2014 Elsevier Interactive Patient Education  2018 [employer]. Common Medications Safe in Pregnancy  Acne:      Constipation:  Benzoyl Peroxide     Colace  Clindamycin      Dulcolax Suppository  Topica Erythromycin     Fibercon  Salicylic Acid      Metamucil         Miralax AVOID:        Senakot   Accutane    Cough:  Retin-A       Cough Drops  Tetracycline      Phenergan w/ Codeine if Rx  Minocycline      Robitussin (Plain & DM)  Antibiotics:     Crabs/Lice:  Ceclor       RID  Cephalosporins    AVOID:  E-Mycins      Kwell  Keflex  Macrobid/Macrodantin   Diarrhea:  Penicillin      Kao-Pectate  Zithromax  Imodium AD         PUSH FLUIDS AVOID:       Cipro     Fever:  Tetracycline      Tylenol (Regular or Extra  Minocycline       Strength)  Levaquin      Extra Strength-Do not          Exceed 8 tabs/24 hrs Caffeine:        <262m/day (equiv. To 1 cup of coffee or  approx. 3 12 oz sodas)         Gas: Cold/Hayfever:       Gas-X  Benadryl      Mylicon  Claritin       Phazyme  **Claritin-D        Chlor-Trimeton    Headaches:  Dimetapp      ASA-Free Excedrin  Drixoral-Non-Drowsy     Cold Compress  Mucinex (Guaifenasin)     Tylenol (Regular or Extra  Sudafed/Sudafed-12 Hour     Strength)  **Sudafed PE Pseudoephedrine   Tylenol Cold & Sinus     Vicks Vapor Rub  Zyrtec  **AVOID if Problems With Blood Pressure         Heartburn: Avoid lying down for at least 1 hour  after meals  Aciphex      Maalox     Rash:  Milk of Magnesia     Benadryl    Mylanta       1% Hydrocortisone Cream  Pepcid  Pepcid Complete   Sleep Aids:  Prevacid      Ambien   Prilosec       Benadryl  Rolaids       Chamomile Tea  Tums (Limit 4/day)     Unisom  Zantac       Tylenol PM         Warm milk-add vanilla or  Hemorrhoids:       Sugar for taste  Anusol/Anusol H.C.  (RX: Analapram 2.5%)  Sugar Substitutes:  Hydrocortisone OTC     Ok in moderation  Preparation H      Tucks        Vaseline lotion applied to tissue with wiping    Herpes:     Throat:  Acyclovir      Oragel  Famvir  Valtrex     Vaccines:         Flu Shot Leg Cramps:       *Gardasil  Benadryl      Hepatitis A         Hepatitis B Nasal Spray:       Pneumovax  Saline Nasal Spray     Polio Booster         Tetanus Nausea:       Tuberculosis test or PPD  Vitamin B6 25 mg TID   AVOID:    Dramamine      *Gardasil  Emetrol       Live Poliovirus  Ginger Root 250 mg QID    MMR (measles, mumps &  High Complex Carbs @ Bedtime    rebella)  Sea Bands-Accupressure    Varicella (Chickenpox)  Unisom 1/2 tab TID     *No known complications           If received before Pain:         Known pregnancy;   Darvocet       Resume series after  Lortab        Delivery  Percocet  Yeast:   Tramadol      Femstat  Tylenol 3      Gyne-lotrimin  Ultram       Monistat  Vicodin           MISC:         All Sunscreens           Hair Coloring/highlights          Insect Repellant's          (Including DEET)         Mystic Tans Second Trimester of Pregnancy The second trimester is from week 14 through week 27 (months 4 through 6). The second trimester is often a time when you feel your best. Your body has adjusted to being pregnant, and you begin to feel better physically. Usually, morning sickness has lessened or quit completely, you may have more energy, and you may have an increase in appetite. The second trimester is also a  time when the fetus is growing rapidly. At the end of the sixth month, the fetus is about 9 inches long and weighs about 1 pounds. You will likely begin to feel the baby move (quickening) between 16 and 20 weeks of pregnancy. Body changes during your second trimester Your body continues to go through many changes during your second trimester. The changes vary from woman to woman.  Your weight will continue to increase. You will notice your lower abdomen bulging out.  You may begin to get stretch marks on your hips, abdomen, and breasts.  You may develop headaches that can be relieved by medicines. The medicines should be approved by your health care provider.  You may urinate more often because the fetus is pressing on your bladder.  You may develop or continue to have heartburn as a result of your pregnancy.  You may develop constipation because certain hormones are causing the muscles that push waste through your intestines to slow down.  You may develop hemorrhoids or swollen, bulging veins (varicose veins).  You may have back pain. This is caused by: ? Weight gain. ? Pregnancy hormones that are relaxing the joints in your pelvis. ? A shift in weight and the muscles that support your balance.  Your breasts will continue to grow and they will continue to become tender.  Your gums may bleed and may be sensitive to brushing and flossing.  Dark spots or blotches (chloasma, mask of pregnancy) may develop on your face. This will likely fade after the baby is born.  A dark line from your belly button to the pubic area (linea nigra) may appear. This will likely fade after the baby is born.  You may have changes in your hair. These can include thickening of your hair, rapid growth, and changes in texture. Some women also have hair loss during or after pregnancy, or hair that feels dry or thin. Your hair will most likely return to normal after your baby is born.  What to expect at prenatal  visits During a routine prenatal visit:  You will be weighed to make sure you and the fetus are growing normally.  Your blood pressure will be taken.  Your abdomen will be measured to track your baby's growth.  The fetal heartbeat will be listened to.  Any test results from the previous visit will be discussed.  Your health care provider may ask you:  How you are feeling.  If you are feeling the baby move.  If you have had any  abnormal symptoms, such as leaking fluid, bleeding, severe headaches, or abdominal cramping.  If you are using any tobacco products, including cigarettes, chewing tobacco, and electronic cigarettes.  If you have any questions.  Other tests that may be performed during your second trimester include:  Blood tests that check for: ? Low iron levels (anemia). ? High blood sugar that affects pregnant women (gestational diabetes) between 76 and 28 weeks. ? Rh antibodies. This is to check for a protein on red blood cells (Rh factor).  Urine tests to check for infections, diabetes, or protein in the urine.  An ultrasound to confirm the proper growth and development of the baby.  An amniocentesis to check for possible genetic problems.  Fetal screens for spina bifida and Down syndrome.  HIV (human immunodeficiency virus) testing. Routine prenatal testing includes screening for HIV, unless you choose not to have this test.  Follow these instructions at home: Medicines  Follow your health care provider's instructions regarding medicine use. Specific medicines may be either safe or unsafe to take during pregnancy.  Take a prenatal vitamin that contains at least 600 micrograms (mcg) of folic acid.  If you develop constipation, try taking a stool softener if your health care provider approves. Eating and drinking  Eat a balanced diet that includes fresh fruits and vegetables, whole grains, good sources of protein such as meat, eggs, or tofu, and low-fat  dairy. Your health care provider will help you determine the amount of weight gain that is right for you.  Avoid raw meat and uncooked cheese. These carry germs that can cause birth defects in the baby.  If you have low calcium intake from food, talk to your health care provider about whether you should take a daily calcium supplement.  Limit foods that are high in fat and processed sugars, such as fried and sweet foods.  To prevent constipation: ? Drink enough fluid to keep your urine clear or pale yellow. ? Eat foods that are high in fiber, such as fresh fruits and vegetables, whole grains, and beans. Activity  Exercise only as directed by your health care provider. Most women can continue their usual exercise routine during pregnancy. Try to exercise for 30 minutes at least 5 days a week. Stop exercising if you experience uterine contractions.  Avoid heavy lifting, wear low heel shoes, and practice good posture.  A sexual relationship may be continued unless your health care provider directs you otherwise. Relieving pain and discomfort  Wear a good support bra to prevent discomfort from breast tenderness.  Take warm sitz baths to soothe any pain or discomfort caused by hemorrhoids. Use hemorrhoid cream if your health care provider approves.  Rest with your legs elevated if you have leg cramps or low back pain.  If you develop varicose veins, wear support hose. Elevate your feet for 15 minutes, 3-4 times a day. Limit salt in your diet. Prenatal Care  Write down your questions. Take them to your prenatal visits.  Keep all your prenatal visits as told by your health care provider. This is important. Safety  Wear your seat belt at all times when driving.  Make a list of emergency phone numbers, including numbers for family, friends, the hospital, and police and fire departments. General instructions  Ask your health care provider for a referral to a local prenatal education  class. Begin classes no later than the beginning of month 6 of your pregnancy.  Ask for help if you have counseling or nutritional needs  during pregnancy. Your health care provider can offer advice or refer you to specialists for help with various needs.  Do not use hot tubs, steam rooms, or saunas.  Do not douche or use tampons or scented sanitary pads.  Do not cross your legs for long periods of time.  Avoid cat litter boxes and soil used by cats. These carry germs that can cause birth defects in the baby and possibly loss of the fetus by miscarriage or stillbirth.  Avoid all smoking, herbs, alcohol, and unprescribed drugs. Chemicals in these products can affect the formation and growth of the baby.  Do not use any products that contain nicotine or tobacco, such as cigarettes and e-cigarettes. If you need help quitting, ask your health care provider.  Visit your dentist if you have not gone yet during your pregnancy. Use a soft toothbrush to brush your teeth and be gentle when you floss. Contact a health care provider if:  You have dizziness.  You have mild pelvic cramps, pelvic pressure, or nagging pain in the abdominal area.  You have persistent nausea, vomiting, or diarrhea.  You have a bad smelling vaginal discharge.  You have pain when you urinate. Get help right away if:  You have a fever.  You are leaking fluid from your vagina.  You have spotting or bleeding from your vagina.  You have severe abdominal cramping or pain.  You have rapid weight gain or weight loss.  You have shortness of breath with chest pain.  You notice sudden or extreme swelling of your face, hands, ankles, feet, or legs.  You have not felt your baby move in over an hour.  You have severe headaches that do not go away when you take medicine.  You have vision changes. Summary  The second trimester is from week 14 through week 27 (months 4 through 6). It is also a time when the fetus is  growing rapidly.  Your body goes through many changes during pregnancy. The changes vary from woman to woman.  Avoid all smoking, herbs, alcohol, and unprescribed drugs. These chemicals affect the formation and growth your baby.  Do not use any tobacco products, such as cigarettes, chewing tobacco, and e-cigarettes. If you need help quitting, ask your health care provider.  Contact your health care provider if you have any questions. Keep all prenatal visits as told by your health care provider. This is important. This information is not intended to replace advice given to you by your health care provider. Make sure you discuss any questions you have with your health care provider. Document Released: 10/16/2001 Document Revised: 03/29/2016 Document Reviewed: 12/23/2012 Elsevier Interactive Patient Education  2017 [employer].

## 2017-08-06 ENCOUNTER — Other Ambulatory Visit: Payer: Self-pay | Admitting: Certified Nurse Midwife

## 2017-08-06 DIAGNOSIS — Z3682 Encounter for antenatal screening for nuchal translucency: Secondary | ICD-10-CM

## 2017-08-07 ENCOUNTER — Other Ambulatory Visit: Payer: Self-pay | Admitting: Certified Nurse Midwife

## 2017-08-07 ENCOUNTER — Ambulatory Visit (INDEPENDENT_AMBULATORY_CARE_PROVIDER_SITE_OTHER): Payer: Federal, State, Local not specified - PPO

## 2017-08-07 ENCOUNTER — Ambulatory Visit: Payer: Federal, State, Local not specified - PPO

## 2017-08-07 DIAGNOSIS — Z3682 Encounter for antenatal screening for nuchal translucency: Secondary | ICD-10-CM | POA: Diagnosis not present

## 2017-08-07 DIAGNOSIS — Z3491 Encounter for supervision of normal pregnancy, unspecified, first trimester: Secondary | ICD-10-CM | POA: Diagnosis not present

## 2017-08-09 ENCOUNTER — Encounter: Payer: Self-pay | Admitting: Certified Nurse Midwife

## 2017-08-09 LAB — FIRST TRIMESTER SCREEN W/NT
CRL: 55.7 mm
DIA MoM: 1.49
DIA VALUE: 389 pg/mL
Gest Age-Collect: 12.1 weeks
HCG MOM: 0.7
HCG VALUE: 66.2 [IU]/mL
MATERNAL AGE AT EDD: 25.2 a
NUMBER OF FETUSES: 1
Nuchal Translucency MoM: 0.94
Nuchal Translucency: 1.1 mm
PAPP-A MoM: 0.56
PAPP-A Value: 464 ng/mL
Test Results:: NEGATIVE
WEIGHT: 148 [lb_av]

## 2017-08-09 LAB — NUSWAB VAGINITIS (VG)
CANDIDA ALBICANS, NAA: POSITIVE — AB
Candida glabrata, NAA: NEGATIVE
Trich vag by NAA: NEGATIVE

## 2017-08-14 ENCOUNTER — Encounter: Payer: Self-pay | Admitting: Certified Nurse Midwife

## 2017-08-14 ENCOUNTER — Other Ambulatory Visit: Payer: Self-pay | Admitting: Certified Nurse Midwife

## 2017-08-14 MED ORDER — TERCONAZOLE 0.4 % VA CREA: 1.0000 | TOPICAL_CREAM | Freq: Every day | VAGINAL | 0 refills | Status: DC

## 2017-08-29 ENCOUNTER — Encounter: Payer: Self-pay | Admitting: Certified Nurse Midwife

## 2017-08-29 ENCOUNTER — Other Ambulatory Visit: Payer: Self-pay

## 2017-08-29 MED ORDER — TERCONAZOLE 0.4 % VA CREA: 1.0000 | TOPICAL_CREAM | Freq: Every day | VAGINAL | 0 refills | Status: DC

## 2017-09-02 ENCOUNTER — Ambulatory Visit (INDEPENDENT_AMBULATORY_CARE_PROVIDER_SITE_OTHER): Payer: Federal, State, Local not specified - PPO | Admitting: Certified Nurse Midwife

## 2017-09-02 VITALS — BP 122/65 | HR 91 | Wt 160.2 lb

## 2017-09-02 DIAGNOSIS — Z3402 Encounter for supervision of normal first pregnancy, second trimester: Secondary | ICD-10-CM

## 2017-09-02 LAB — POCT URINALYSIS DIPSTICK
Bilirubin, UA: NEGATIVE
Glucose, UA: NEGATIVE
KETONES UA: NEGATIVE
Leukocytes, UA: NEGATIVE
Nitrite, UA: NEGATIVE
PROTEIN UA: NEGATIVE
SPEC GRAV UA: 1.02 (ref 1.010–1.025)
Urobilinogen, UA: 0.2 E.U./dL
pH, UA: 7 (ref 5.0–8.0)

## 2017-09-02 NOTE — Progress Notes (Signed)
Pt is here for an ROB. C/o fatigue and sharp pain in left side back.

## 2017-09-02 NOTE — Patient Instructions (Signed)
Round Ligament Pain The round ligament is a cord of muscle and tissue that helps to support the uterus. It can become a source of pain during pregnancy if it becomes stretched or twisted as the baby grows. The pain usually begins in the second trimester of pregnancy, and it can come and go until the baby is delivered. It is not a serious problem, and it does not cause harm to the baby. Round ligament pain is usually a short, sharp, and pinching pain, but it can also be a dull, lingering, and aching pain. The pain is felt in the lower side of the abdomen or in the groin. It usually starts deep in the groin and moves up to the outside of the hip area. Pain can occur with:  A sudden change in position.  Rolling over in bed.  Coughing or sneezing.  Physical activity.  Follow these instructions at home: Watch your condition for any changes. Take these steps to help with your pain:  When the pain starts, relax. Then try: ? Sitting down. ? Flexing your knees up to your abdomen. ? Lying on your side with one pillow under your abdomen and another pillow between your legs. ? Sitting in a warm bath for 15-20 minutes or until the pain goes away.  Take over-the-counter and prescription medicines only as told by your health care provider.  Move slowly when you sit and stand.  Avoid long walks if they cause pain.  Stop or lessen your physical activities if they cause pain.  Contact a health care provider if:  Your pain does not go away with treatment.  You feel pain in your back that you did not have before.  Your medicine is not helping. Get help right away if:  You develop a fever or chills.  You develop uterine contractions.  You develop vaginal bleeding.  You develop nausea or vomiting.  You develop diarrhea.  You have pain when you urinate. This information is not intended to replace advice given to you by your health care provider. Make sure you discuss any questions you have  with your health care provider. Document Released: 07/31/2008 Document Revised: 03/29/2016 Document Reviewed: 12/29/2014 Elsevier Interactive Patient Education  2018 [employer] How a Baby Grows During Pregnancy Pregnancy begins when a female's sperm enters a female's egg (fertilization). This happens in one of the tubes (fallopian tubes) that connect the ovaries to the womb (uterus). The fertilized egg is called an embryo until it reaches 10 weeks. From 10 weeks until birth, it is called a fetus. The fertilized egg moves down the fallopian tube to the uterus. Then it implants into the lining of the uterus and begins to grow. The developing fetus receives oxygen and nutrients through the pregnant woman's bloodstream and the tissues that grow (placenta) to support the fetus. The placenta is the life support system for the fetus. It provides nutrition and removes waste. Learning as much as you can about your pregnancy and how your baby is developing can help you enjoy the experience. It can also make you aware of when there might be a problem and when to ask questions. How long does a typical pregnancy last? A pregnancy usually lasts 280 days, or about 40 weeks. Pregnancy is divided into three trimesters:  First trimester: 0-13 weeks.  Second trimester: 14-27 weeks.  Third trimester: 28-40 weeks.  The day when your baby is considered ready to be born (full term) is your estimated date of delivery. How does   my baby develop month by month? First month  The fertilized egg attaches to the inside of the uterus.  Some cells will form the placenta. Others will form the fetus.  The arms, legs, brain, spinal cord, lungs, and heart begin to develop.  At the end of the first month, the heart begins to beat.  Second month  The bones, inner ear, eyelids, hands, and feet form.  The genitals develop.  By the end of 8 weeks, all major organs are developing.  Third month  All of the internal  organs are forming.  Teeth develop below the gums.  Bones and muscles begin to grow. The spine can flex.  The skin is transparent.  Fingernails and toenails begin to form.  Arms and legs continue to grow longer, and hands and feet develop.  The fetus is about 3 in (7.6 cm) long.  Fourth month  The placenta is completely formed.  The external sex organs, neck, outer ear, eyebrows, eyelids, and fingernails are formed.  The fetus can hear, swallow, and move its arms and legs.  The kidneys begin to produce urine.  The skin is covered with a white waxy coating (vernix) and very fine hair (lanugo).  Fifth month  The fetus moves around more and can be felt for the first time (quickening).  The fetus starts to sleep and wake up and may begin to suck its finger.  The nails grow to the end of the fingers.  The organ in the digestive system that makes bile (gallbladder) functions and helps to digest the nutrients.  If your baby is a girl, eggs are present in her ovaries. If your baby is a boy, testicles start to move down into his scrotum.  Sixth month  The lungs are formed, but the fetus is not yet able to breathe.  The eyes open. The brain continues to develop.  Your baby has fingerprints and toe prints. Your baby's hair grows thicker.  At the end of the second trimester, the fetus is about 9 in (22.9 cm) long.  Seventh month  The fetus kicks and stretches.  The eyes are developed enough to sense changes in light.  The hands can make a grasping motion.  The fetus responds to sound.  Eighth month  All organs and body systems are fully developed and functioning.  Bones harden and taste buds develop. The fetus may hiccup.  Certain areas of the brain are still developing. The skull remains soft.  Ninth month  The fetus gains about  lb (0.23 kg) each week.  The lungs are fully developed.  Patterns of sleep develop.  The fetus's head typically moves into a  head-down position (vertex) in the uterus to prepare for birth. If the buttocks move into a vertex position instead, the baby is breech.  The fetus weighs 6-9 lbs (2.72-4.08 kg) and is 19-20 in (48.26-50.8 cm) long.  What can I do to have a healthy pregnancy and help my baby develop? Eating and Drinking  Eat a healthy diet. ? Talk with your health care provider to make sure that you are getting the nutrients that you and your baby need. ? Visit www.choosemyplate.gov to learn about creating a healthy diet.  Gain a healthy amount of weight during pregnancy as advised by your health care provider. This is usually 25-35 pounds. You may need to: ? Gain more if you were underweight before getting pregnant or if you are pregnant with more than one baby. ? Gain less   if you were overweight or obese when you got pregnant.  Medicines and Vitamins  Take prenatal vitamins as directed by your health care provider. These include vitamins such as folic acid, iron, calcium, and vitamin D. They are important for healthy development.  Take medicines only as directed by your health care provider. Read labels and ask a pharmacist or your health care provider whether over-the-counter medicines, supplements, and prescription drugs are safe to take during pregnancy.  Activities  Be physically active as advised by your health care provider. Ask your health care provider to recommend activities that are safe for you to do, such as walking or swimming.  Do not participate in strenuous or extreme sports.  Lifestyle  Do not drink alcohol.  Do not use any tobacco products, including cigarettes, chewing tobacco, or electronic cigarettes. If you need help quitting, ask your health care provider.  Do not use illegal drugs.  Safety  Avoid exposure to mercury, lead, or other heavy metals. Ask your health care provider about common sources of these heavy metals.  Avoid listeria infection during pregnancy. Follow  these precautions: ? Do not eat soft cheeses or deli meats. ? Do not eat hot dogs unless they have been warmed up to the point of steaming, such as in the microwave oven. ? Do not drink unpasteurized milk.  Avoid toxoplasmosis infection during pregnancy. Follow these precautions: ? Do not change your cat's litter box, if you have a cat. Ask someone else to do this for you. ? Wear gardening gloves while working in the yard.  General Instructions  Keep all follow-up visits as directed by your health care provider. This is important. This includes prenatal care and screening tests.  Manage any chronic health conditions. Work closely with your health care provider to keep conditions, such as diabetes, under control.  How do I know if my baby is developing well? At each prenatal visit, your health care provider will do several different tests to check on your health and keep track of your baby's development. These include:  Fundal height. ? Your health care provider will measure your growing belly from top to bottom using a tape measure. ? Your health care provider will also feel your belly to determine your baby's position.  Heartbeat. ? An ultrasound in the first trimester can confirm pregnancy and show a heartbeat, depending on how far along you are. ? Your health care provider will check your baby's heart rate at every prenatal visit. ? As you get closer to your delivery date, you may have regular fetal heart rate monitoring to make sure that your baby is not in distress.  Second trimester ultrasound. ? This ultrasound checks your baby's development. It also indicates your baby's gender.  What should I do if I have concerns about my baby's development? Always talk with your health care provider about any concerns that you may have. This information is not intended to replace advice given to you by your health care provider. Make sure you discuss any questions you have with your health  care provider. Document Released: 04/09/2008 Document Revised: 03/29/2016 Document Reviewed: 03/31/2014 Elsevier Interactive Patient Education  2018 [employer] Inc.  

## 2017-09-02 NOTE — Progress Notes (Signed)
ROB, doing well.States nausea has decreased significantly. She feels movement occasionally. Discussed u/s at next appointment. Follow up 4 wks.   Doreene BurkeAnnie Teruko Joswick, CNM

## 2017-10-02 ENCOUNTER — Ambulatory Visit (INDEPENDENT_AMBULATORY_CARE_PROVIDER_SITE_OTHER): Payer: Federal, State, Local not specified - PPO | Admitting: Obstetrics and Gynecology

## 2017-10-02 ENCOUNTER — Ambulatory Visit (INDEPENDENT_AMBULATORY_CARE_PROVIDER_SITE_OTHER): Payer: Federal, State, Local not specified - PPO

## 2017-10-02 VITALS — BP 127/64 | HR 90 | Wt 169.3 lb

## 2017-10-02 DIAGNOSIS — Z3402 Encounter for supervision of normal first pregnancy, second trimester: Secondary | ICD-10-CM

## 2017-10-02 DIAGNOSIS — Z3492 Encounter for supervision of normal pregnancy, unspecified, second trimester: Secondary | ICD-10-CM

## 2017-10-02 DIAGNOSIS — Z23 Encounter for immunization: Secondary | ICD-10-CM

## 2017-10-02 LAB — POCT URINALYSIS DIPSTICK
Bilirubin, UA: NEGATIVE
Glucose, UA: NEGATIVE
KETONES UA: NEGATIVE
Nitrite, UA: NEGATIVE
PH UA: 7 (ref 5.0–8.0)
PROTEIN UA: NEGATIVE
RBC UA: NEGATIVE
SPEC GRAV UA: 1.01 (ref 1.010–1.025)
UROBILINOGEN UA: 0.2 U/dL

## 2017-10-02 MED ORDER — PANTOPRAZOLE SODIUM 40 MG PO TBEC: 40.0000 mg | DELAYED_RELEASE_TABLET | Freq: Every day | ORAL | 6 refills | Status: DC

## 2017-10-02 NOTE — Progress Notes (Signed)
ROB- here for anatomy scan; Heartburn worse will switch to protonix.

## 2017-10-02 NOTE — Progress Notes (Signed)
ROB- anatomy scan done today, pt is having some "chest pains" states she is having some burping and belching

## 2017-10-02 NOTE — Patient Instructions (Signed)
Heartburn During Pregnancy Heartburn is pain or discomfort in the throat or chest. It may cause a burning feeling. It happens when stomach acid moves up into the tube that carries food from your mouth to your stomach (esophagus). Heartburn is common during pregnancy. It usually goes away or gets better after giving birth. Follow these instructions at home: Eating and drinking  Do not drink alcohol while you are pregnant.  Figure out which foods and beverages make you feel worse, and avoid them.  Beverages that you may want to avoid include: ? Coffee and tea (with or without caffeine). ? Energy drinks and sports drinks. ? Bubbly (carbonated) drinks or sodas. ? Citrus fruit juices.  Foods that you may want to avoid include: ? Chocolate and cocoa. ? Peppermint and mint flavorings. ? Garlic, onions, and horseradish. ? Spicy and acidic foods. These include peppers, chili powder, curry powder, vinegar, hot sauces, and barbecue sauce. ? Citrus fruits, such as oranges, lemons, and limes. ? Tomato-based foods, such as red sauce, chili, and salsa. ? Fried and fatty foods, such as donuts, french fries, potato chips, and high-fat dressings. ? High-fat meats, such as hot dogs, cold cuts, sausage, ham, and bacon. ? High-fat dairy items, such as whole milk, butter, and cheese.  Eat small meals often, instead of large meals.  Avoid drinking a lot of liquid with your meals.  Avoid eating meals during the 2-3 hours before you go to bed.  Avoid lying down right after you eat.  Do not exercise right after you eat. Medicines  Take over-the-counter and prescription medicines only as told by your doctor.  Do not take aspirin, ibuprofen, or other NSAIDs unless your doctor tells you to do that.  Your doctor may tell you to avoid medicines that have sodium bicarbonate in them. General instructions  If told, raise the head of your bed about 6 inches (15 cm). You can do this by putting blocks under  the legs. Sleeping with more pillows does not help with heartburn.  Do not use any products that contain nicotine or tobacco, such as cigarettes and e-cigarettes. If you need help quitting, ask your doctor.  Wear loose-fitting clothing.  Try to lower your stress, such as with yoga or meditation. If you need help, ask your doctor.  Stay at a healthy weight. If you are overweight, work with your doctor to safely lose weight.  Keep all follow-up visits as told by your doctor. This is important. Contact a doctor if:  You get new symptoms.  Your symptoms do not get better with treatment.  You have weight loss and you do not know why.  You have trouble swallowing.  You make loud sounds when you breathe (wheeze).  You have a cough that does not go away.  You have heartburn often for more than 2 weeks.  You feel sick to your stomach (nauseous), and this does not get better with treatment.  You are throwing up (vomiting), and this does not get better with treatment.  You have pain in your belly (abdomen). Get help right away if:  You have very bad chest pain that spreads to your arm, neck, or jaw.  You feel sweaty, dizzy, or light-headed.  You have trouble breathing.  You have pain when swallowing.  You throw up and your throw-up looks like blood or coffee grounds.  Your poop (stool) is bloody or black. This information is not intended to replace advice given to you by your health care provider.   Make sure you discuss any questions you have with your health care provider. Document Released: 11/24/2010 Document Revised: 07/09/2016 Document Reviewed: 07/09/2016 Elsevier Interactive Patient Education  2017 Elsevier Inc.  

## 2017-10-31 ENCOUNTER — Ambulatory Visit (INDEPENDENT_AMBULATORY_CARE_PROVIDER_SITE_OTHER): Payer: Federal, State, Local not specified - PPO | Admitting: Certified Nurse Midwife

## 2017-10-31 ENCOUNTER — Encounter: Payer: Self-pay | Admitting: Certified Nurse Midwife

## 2017-10-31 VITALS — BP 109/66 | HR 85 | Wt 177.1 lb

## 2017-10-31 DIAGNOSIS — Z3492 Encounter for supervision of normal pregnancy, unspecified, second trimester: Secondary | ICD-10-CM

## 2017-10-31 DIAGNOSIS — Z113 Encounter for screening for infections with a predominantly sexual mode of transmission: Secondary | ICD-10-CM

## 2017-10-31 DIAGNOSIS — O43199 Other malformation of placenta, unspecified trimester: Secondary | ICD-10-CM

## 2017-10-31 DIAGNOSIS — Z131 Encounter for screening for diabetes mellitus: Secondary | ICD-10-CM

## 2017-10-31 DIAGNOSIS — Z130 Encounter for screening for diseases of the blood and blood-forming organs and certain disorders involving the immune mechanism: Secondary | ICD-10-CM

## 2017-10-31 LAB — POCT URINALYSIS DIPSTICK
BILIRUBIN UA: NEGATIVE
Blood, UA: NEGATIVE
Glucose, UA: NEGATIVE
KETONES UA: NEGATIVE
NITRITE UA: NEGATIVE
PH UA: 6.5 (ref 5.0–8.0)
PROTEIN UA: NEGATIVE
SPEC GRAV UA: 1.025 (ref 1.010–1.025)
UROBILINOGEN UA: 0.2 U/dL

## 2017-10-31 NOTE — Patient Instructions (Signed)
Common Medications Safe in Pregnancy  Acne:      Constipation:  Benzoyl Peroxide     Colace  Clindamycin      Dulcolax Suppository  Topica Erythromycin     Fibercon  Salicylic Acid      Metamucil         Miralax AVOID:        Senakot   Accutane    Cough:  Retin-A       Cough Drops  Tetracycline      Phenergan w/ Codeine if Rx  Minocycline      Robitussin (Plain & DM)  Antibiotics:     Crabs/Lice:  Ceclor       RID  Cephalosporins    AVOID:  E-Mycins      Kwell  Keflex  Macrobid/Macrodantin   Diarrhea:  Penicillin      Kao-Pectate  Zithromax      Imodium AD         PUSH FLUIDS AVOID:       Cipro     Fever:  Tetracycline      Tylenol (Regular or Extra  Minocycline       Strength)  Levaquin      Extra Strength-Do not          Exceed 8 tabs/24 hrs Caffeine:        <200mg/day (equiv. To 1 cup of coffee or  approx. 3 12 oz sodas)         Gas: Cold/Hayfever:       Gas-X  Benadryl      Mylicon  Claritin       Phazyme  **Claritin-D        Chlor-Trimeton    Headaches:  Dimetapp      ASA-Free Excedrin  Drixoral-Non-Drowsy     Cold Compress  Mucinex (Guaifenasin)     Tylenol (Regular or Extra  Sudafed/Sudafed-12 Hour     Strength)  **Sudafed PE Pseudoephedrine   Tylenol Cold & Sinus     Vicks Vapor Rub  Zyrtec  **AVOID if Problems With Blood Pressure         Heartburn: Avoid lying down for at least 1 hour after meals  Aciphex      Maalox     Rash:  Milk of Magnesia     Benadryl    Mylanta       1% Hydrocortisone Cream  Pepcid  Pepcid Complete   Sleep Aids:  Prevacid      Ambien   Prilosec       Benadryl  Rolaids       Chamomile Tea  Tums (Limit 4/day)     Unisom  Zantac       Tylenol PM         Warm milk-add vanilla or  Hemorrhoids:       Sugar for taste  Anusol/Anusol H.C.  (RX: Analapram 2.5%)  Sugar Substitutes:  Hydrocortisone OTC     Ok in moderation  Preparation H      Tucks        Vaseline lotion applied to tissue with  wiping    Herpes:     Throat:  Acyclovir      Oragel  Famvir  Valtrex     Vaccines:         Flu Shot Leg Cramps:       *Gardasil  Benadryl      Hepatitis A         Hepatitis B Nasal Spray:         Pneumovax  Saline Nasal Spray     Polio Booster         Tetanus Nausea:       Tuberculosis test or PPD  Vitamin B6 25 mg TID   AVOID:    Dramamine      *Gardasil  Emetrol       Live Poliovirus  Ginger Root 250 mg QID    MMR (measles, mumps &  High Complex Carbs @ Bedtime    rebella)  Sea Bands-Accupressure    Varicella (Chickenpox)  Unisom 1/2 tab TID     *No known complications           If received before Pain:         Known pregnancy;   Darvocet       Resume series after  Lortab        Delivery  Percocet    Yeast:   Tramadol      Femstat  Tylenol 3      Gyne-lotrimin  Ultram       Monistat  Vicodin           MISC:         All Sunscreens           Hair Coloring/highlights          Insect Repellant's          (Including DEET)         Mystic Tans Back Pain in Pregnancy Back pain during pregnancy is common. Back pain may be caused by several factors that are related to changes during your pregnancy. Follow these instructions at home: Managing pain, stiffness, and swelling  If directed, apply ice for sudden (acute) back pain. ? Put ice in a plastic bag. ? Place a towel between your skin and the bag. ? Leave the ice on for 20 minutes, 2-3 times per day.  If directed, apply heat to the affected area before you exercise: ? Place a towel between your skin and the heat pack or heating pad. ? Leave the heat on for 20-30 minutes. ? Remove the heat if your skin turns bright red. This is especially important if you are unable to feel pain, heat, or cold. You may have a greater risk of getting burned. Activity  Exercise as told by your health care provider. Exercising is the best way to prevent or manage back pain.  Listen to your body when lifting. If lifting hurts, ask for help or  bend your knees. This uses your leg muscles instead of your back muscles.  Squat down when picking up something from the floor. Do not bend over.  Only use bed rest as told by your health care provider. Bed rest should only be used for the most severe episodes of back pain. Standing, Sitting, and Lying Down  Do not stand in one place for long periods of time.  Use good posture when sitting. Make sure your head rests over your shoulders and is not hanging forward. Use a pillow on your lower back if necessary.  Try sleeping on your side, preferably the left side, with a pillow or two between your legs. If you are sore after a night's rest, your bed may be too soft. A firm mattress may provide more support for your back during pregnancy. General instructions  Do not wear high heels.  Eat a healthy diet. Try to gain weight within your health care provider's recommendations.  Use a maternity girdle, elastic sling, or   back brace as told by your health care provider.  Take over-the-counter and prescription medicines only as told by your health care provider.  Keep all follow-up visits as told by your health care provider. This is important. This includes any visits with any specialists, such as a physical therapist. Contact a health care provider if:  Your back pain interferes with your daily activities.  You have increasing pain in other parts of your body. Get help right away if:  You develop numbness, tingling, weakness, or problems with the use of your arms or legs.  You develop severe back pain that is not controlled with medicine.  You have a sudden change in bowel or bladder control.  You develop shortness of breath, dizziness, or you faint.  You develop nausea, vomiting, or sweating.  You have back pain that is a rhythmic, cramping pain similar to labor pains. Labor pain is usually 1-2 minutes apart, lasts for about 1 minute, and involves a bearing down feeling or pressure in  your pelvis.  You have back pain and your water breaks or you have vaginal bleeding.  You have back pain or numbness that travels down your leg.  Your back pain developed after you fell.  You develop pain on one side of your back.  You see blood in your urine.  You develop skin blisters in the area of your back pain. This information is not intended to replace advice given to you by your health care provider. Make sure you discuss any questions you have with your health care provider. Document Released: 01/30/2006 Document Revised: 03/29/2016 Document Reviewed: 07/06/2015 Elsevier Interactive Patient Education  2018 [place]. Abdominal Pain During Pregnancy Belly (abdominal) pain is common during pregnancy. Most of the time, it is not a serious problem. Other times, it can be a sign that something is wrong with the pregnancy. Always tell your doctor if you have belly pain. Follow these instructions at home: Monitor your belly pain for any changes. The following actions may help you feel better:  Do not have sex (intercourse) or put anything in your vagina until you feel better.  Rest until your pain stops.  Drink clear fluids if you feel sick to your stomach (nauseous). Do not eat solid food until you feel better.  Only take medicine as told by your doctor.  Keep all doctor visits as told.  Get help right away if:  You are bleeding, leaking fluid, or pieces of tissue come out of your vagina.  You have more pain or cramping.  You keep throwing up (vomiting).  You have pain when you pee (urinate) or have blood in your pee.  You have a fever.  You do not feel your baby moving as much.  You feel very weak or feel like passing out.  You have trouble breathing, with or without belly pain.  You have a very bad headache and belly pain.  You have fluid leaking from your vagina and belly pain.  You keep having watery poop (diarrhea).  Your belly pain does not go away  after resting, or the pain gets worse. This information is not intended to replace advice given to you by your health care provider. Make sure you discuss any questions you have with your health care provider. Document Released: 10/10/2009 Document Revised: 05/30/2016 Document Reviewed: 05/21/2013 Elsevier Interactive Patient Education  [employer].

## 2017-10-31 NOTE — Progress Notes (Signed)
ROB- Pt has had a lot of pelvic pressure and discomfort, just want to assure that she is ok denies vaginal bleeding,

## 2017-11-04 ENCOUNTER — Encounter: Payer: Self-pay | Admitting: Certified Nurse Midwife

## 2017-11-04 DIAGNOSIS — O43199 Other malformation of placenta, unspecified trimester: Secondary | ICD-10-CM | POA: Insufficient documentation

## 2017-11-04 NOTE — Progress Notes (Signed)
ROB-Discussed home treatment measures for the "discomforts of pregnancy" including abdominal support. Anticipatory guidance regarding 28 week labs and course of prenatal care. [facility], [facility], and [facility] information given. Reviewed red flag symptoms and when to call. RTC x Growth US due to marginal cord insertion, 28 week labs, and ROB with Pattricia Bossnnie or sooner if needed.

## 2017-11-05 NOTE — L&D Delivery Note (Signed)
Delivery Note   Rebecca Rollins is a 27 y.o. G1P1001 at 8892w0d Estimated Date of Delivery: 02/17/18  PRE-OPERATIVE DIAGNOSIS:  1) 4392w0d pregnancy.   POST-OPERATIVE DIAGNOSIS:  1) 9092w0d pregnancy s/p Vaginal, Spontaneous   Delivery Type: Vaginal, Spontaneous    Delivery Anesthesia: Epidural   Labor Complications:   none    ESTIMATED BLOOD LOSS: 200 ml    FINDINGS:   1) female infant, Apgar scores of 7    at 1 minute and 8    at 5 minutes and a birthweight of 68.43  ounces.    2) Nuchal cord: no, compound presentation right hand  SPECIMENS:   PLACENTA:circumvallata   Appearance: Intact , 3 vessel cord, marginal insertion,circumvallata   Removal: Spontaneous      Disposition:   held per protocol then discarded  DISPOSITION:  Infant to left in stable condition in the delivery room, with L&D personnel and mother,  NARRATIVE SUMMARY: Labor course:  Ms. Rebecca Rollins is a G1P1001 at 3992w0d who presented for induction of labor.  She progressed well in labor with cytotec.  She received the appropriate anesthesia and proceeded to complete dilation. She evidenced good maternal expulsive effort during the second stage. She went on to deliver a viable female infant. The placenta delivered without problems and was noted to be complete. A perineal and vaginal examination was performed. Lacerations:   bilateral labial hemostatic not in need of repair. 2nd degree perineal.Lacerations were repaired with 3-0 Vicryl Rapide suture. The patient tolerated this well.  Doreene Burkennie Chinyere Galiano, CNM  01/27/2018 6:18 PM

## 2017-11-18 ENCOUNTER — Telehealth: Payer: Self-pay | Admitting: Certified Nurse Midwife

## 2017-11-18 NOTE — Telephone Encounter (Signed)
The patient called and stated that she would like to speak with a nurse as soon as possible in regards to her having panic attacks this past Saturday and she is also 6 mo., The patient has a lot of concerns and questions. Please advise.

## 2017-11-18 NOTE — Telephone Encounter (Signed)
Pt states on Saturday she had a panic attack. Her face was red, her body was shaking, and breathing heavy. Lasted about 15-20 minutes. She had another one this morning but not has bad. She states she has had recent stress from her relationship. Does have a h/o panic attacks in college. Advised pt to take slow deep breaths. Breathe in a paper bag.  Stay away from stressful situations. If she develops chest pain or is unable to slow her breathing she will need to go to the nearest emergency room or contact office. Pt to f/u with provider at next OV.

## 2017-11-29 ENCOUNTER — Ambulatory Visit (INDEPENDENT_AMBULATORY_CARE_PROVIDER_SITE_OTHER): Payer: Federal, State, Local not specified - PPO | Admitting: Certified Nurse Midwife

## 2017-11-29 ENCOUNTER — Ambulatory Visit (INDEPENDENT_AMBULATORY_CARE_PROVIDER_SITE_OTHER): Payer: Federal, State, Local not specified - PPO

## 2017-11-29 ENCOUNTER — Encounter: Payer: Self-pay | Admitting: Certified Nurse Midwife

## 2017-11-29 ENCOUNTER — Ambulatory Visit: Payer: Federal, State, Local not specified - PPO

## 2017-11-29 VITALS — BP 116/69 | HR 83 | Wt 177.1 lb

## 2017-11-29 DIAGNOSIS — Z131 Encounter for screening for diabetes mellitus: Secondary | ICD-10-CM

## 2017-11-29 DIAGNOSIS — Z23 Encounter for immunization: Secondary | ICD-10-CM

## 2017-11-29 DIAGNOSIS — Z130 Encounter for screening for diseases of the blood and blood-forming organs and certain disorders involving the immune mechanism: Secondary | ICD-10-CM

## 2017-11-29 DIAGNOSIS — Z3492 Encounter for supervision of normal pregnancy, unspecified, second trimester: Secondary | ICD-10-CM

## 2017-11-29 DIAGNOSIS — Z113 Encounter for screening for infections with a predominantly sexual mode of transmission: Secondary | ICD-10-CM | POA: Diagnosis not present

## 2017-11-29 DIAGNOSIS — O43199 Other malformation of placenta, unspecified trimester: Secondary | ICD-10-CM

## 2017-11-29 LAB — POCT URINALYSIS DIPSTICK
Bilirubin, UA: NEGATIVE
Blood, UA: NEGATIVE
Glucose, UA: NEGATIVE
KETONES UA: NEGATIVE
LEUKOCYTES UA: NEGATIVE
NITRITE UA: NEGATIVE
PH UA: 6.5 (ref 5.0–8.0)
PROTEIN UA: NEGATIVE
Spec Grav, UA: 1.02 (ref 1.010–1.025)
UROBILINOGEN UA: 0.2 U/dL

## 2017-11-29 NOTE — Progress Notes (Signed)
Pt is here for an ROB visit. Tdap given, trans papers signed and 28 week labs done.

## 2017-11-29 NOTE — Progress Notes (Signed)
ROB doing well. 1 hr GTT/Tdap/BTC/CBC/RPR today. Discussed cord blood donation (private & public) also discussed birth control after baby. Booklet given. Will follow up with lab result. Answered questions about marginal cord insertion. She has repeat u/s later today.  ROB in 2 wk.   Doreene BurkeAnnie Cauy Melody, CNM

## 2017-11-29 NOTE — Patient Instructions (Signed)
Glucose Tolerance Test During Pregnancy The glucose tolerance test (GTT) is a blood test used to determine if you have developed a type of diabetes during pregnancy (gestational diabetes). This is when your body does not properly process sugar (glucose) in the food you eat, resulting in high blood glucose levels. Typically, a GTT is done after you have had a 1-hour glucose test with results that indicate you possibly have gestational diabetes. It may also be done if:  You have a history of giving birth to very large babies or have experienced repeated fetal loss (stillbirth).  You have signs and symptoms of diabetes, such as: ? Changes in your vision. ? Tingling or numbness in your hands or feet. ? Changes in hunger, thirst, and urination not otherwise explained by your pregnancy.  The GTT lasts about 3 hours. You will be given a sugar-water solution to drink at the beginning of the test. You will have blood drawn before you drink the solution and then again 1, 2, and 3 hours after you drink it. You will not be allowed to eat or drink anything else during the test. You must remain at the testing location to make sure that your blood is drawn on time. You should also avoid exercising during the test, because exercise can alter test results. How do I prepare for this test? Eat normally for 3 days prior to the GTT test, including having plenty of carbohydrate-rich foods. Do not eat or drink anything except water during the final 12 hours before the test. In addition, your health care provider may ask you to stop taking certain medicines before the test. What do the results mean? It is your responsibility to obtain your test results. Ask the lab or department performing the test when and how you will get your results. Contact your health care provider to discuss any questions you have about your results. Range of Normal Values Ranges for normal values may vary among different labs and hospitals. You  should always check with your health care provider after having lab work or other tests done to discuss whether your values are considered within normal limits. Normal levels of blood glucose are as follows:  Fasting: less than 105 mg/dL.  1 hour after drinking the solution: less than 190 mg/dL.  2 hours after drinking the solution: less than 165 mg/dL.  3 hours after drinking the solution: less than 145 mg/dL.  Some substances can interfere with GTT results. These may include:  Blood pressure and heart failure medicines, including beta blockers, furosemide, and thiazides.  Anti-inflammatory medicines, including aspirin.  Nicotine.  Some psychiatric medicines.  Meaning of Results Outside Normal Value Ranges GTT test results that are above normal values may indicate a number of health problems, such as:  Gestational diabetes.  Acute stress response.  Cushing syndrome.  Tumors such as pheochromocytoma or glucagonoma.  Long-term kidney problems.  Pancreatitis.  Hyperthyroidism.  Current infection.  Discuss your test results with your health care provider. He or she will use the results to make a diagnosis and determine a treatment plan that is right for you. This information is not intended to replace advice given to you by your health care provider. Make sure you discuss any questions you have with your health care provider. Document Released: 04/22/2012 Document Revised: 03/29/2016 Document Reviewed: 02/26/2014 Elsevier Interactive Patient Education  2018 Elsevier Inc[employer] Cord Blood Banking Information Cord blood banking is the process of collecting and storing the blood that is in the umbilical   cord and placenta at the time of delivery. This blood contains stem cells, which can be used to treat many blood diseases, immune system disorders, and childhood cancers. Stem cells can also be used to research certain diseases and treatments. Many people who choose cord blood  banking donate the blood. Donated blood can be used in lifesaving treatments or for research. Other people choose to store the blood privately. Blood that is stored privately can only be used with the person's permission. This option is often chosen if:  A family member needs a stem cell transplant.  The child is part of an ethnic minority.  The child was conceived through in vitro fertilization.  What should I look for in a blood bank? A blood bank is the organization that coordinates cord blood banking. Make sure the cord blood bank that you use:  Is accredited.  Is financially stable.  Handles a large volume of cord blood samples.  Has a procedure in place for transport and storage.  Allows you the option of transferring your cord blood sample.  Has a procedure in place if the bank goes out of business.  Clearly states all costs and limits to future costs.  People who choose to donate cord blood should not need to pay for blood banking. People who keep the blood for private use will need to pay for the first (initial) storage and pay a fee each year (annual fee). Other fees may also apply. What are the risks of cord blood banking? There are no health risks associated with cord blood banking. It is considered safe. How should I prepare? You must schedule this process at least 4-6 weeks before you will be giving birth. How is the blood collected? The blood is collected as soon as the baby has been delivered. Within 15 minutes of delivery, a health care provider will take these actions to collect the blood:  Clamp the umbilical cord at the top and bottom. This traps the blood in the umbilical cord.  Use a syringe or bag to collect the blood.  Insert needles into the placenta to collect (draw out) more blood.  What happens after the blood is collected? After the blood has been collected:  The blood will be sent to a blood bank.  The blood will be tested for genetic problems  and infectious diseases. If the blood tests positive for a genetic problem or a disease, someone will contact you and let you know.  The blood will be frozen.  If your child develops a genetic condition, immune system disorder, or cancer, you will be responsible for contacting the blood bank and letting them know. This information is not intended to replace advice given to you by your health care provider. Make sure you discuss any questions you have with your health care provider. Document Released: 04/11/2010 Document Revised: 03/29/2016 Document Reviewed: 04/11/2015 Elsevier Interactive Patient Education  2018 Elsevier Inc.  

## 2017-11-30 LAB — CBC
Hematocrit: 33.7 % — ABNORMAL LOW (ref 34.0–46.6)
Hemoglobin: 11 g/dL — ABNORMAL LOW (ref 11.1–15.9)
MCH: 26.4 pg — ABNORMAL LOW (ref 26.6–33.0)
MCHC: 32.6 g/dL (ref 31.5–35.7)
MCV: 81 fL (ref 79–97)
Platelets: 300 10*3/uL (ref 150–379)
RBC: 4.16 x10E6/uL (ref 3.77–5.28)
RDW: 15.6 % — ABNORMAL HIGH (ref 12.3–15.4)
WBC: 7 10*3/uL (ref 3.4–10.8)

## 2017-11-30 LAB — RPR: RPR: NONREACTIVE

## 2017-11-30 LAB — GLUCOSE, 1 HOUR GESTATIONAL: GESTATIONAL DIABETES SCREEN: 90 mg/dL (ref 65–139)

## 2017-12-02 ENCOUNTER — Other Ambulatory Visit: Payer: Self-pay | Admitting: Certified Nurse Midwife

## 2017-12-02 ENCOUNTER — Telehealth: Payer: Self-pay | Admitting: Certified Nurse Midwife

## 2017-12-02 DIAGNOSIS — Z3493 Encounter for supervision of normal pregnancy, unspecified, third trimester: Secondary | ICD-10-CM

## 2017-12-02 NOTE — Telephone Encounter (Signed)
My chart message to review u/s results and follow up in 3 wk.   Doreene BurkeAnnie Serrena Linderman, CNM

## 2017-12-02 NOTE — Progress Notes (Signed)
U/s for growth and marginal cord insertion repeat in 3 weeks (12/23/17). Per consultation with Dr. Greggory Keenefrancesco. Pt notified via my chart.   Doreene BurkeAnnie Jonnae Fonseca, CNM

## 2017-12-13 ENCOUNTER — Ambulatory Visit (INDEPENDENT_AMBULATORY_CARE_PROVIDER_SITE_OTHER): Payer: Federal, State, Local not specified - PPO | Admitting: Certified Nurse Midwife

## 2017-12-13 VITALS — BP 122/67 | HR 98 | Wt 180.5 lb

## 2017-12-13 DIAGNOSIS — Z3493 Encounter for supervision of normal pregnancy, unspecified, third trimester: Secondary | ICD-10-CM

## 2017-12-13 LAB — POCT URINALYSIS DIPSTICK
Bilirubin, UA: NEGATIVE
Glucose, UA: NEGATIVE
Ketones, UA: NEGATIVE
NITRITE UA: NEGATIVE
PH UA: 6.5 (ref 5.0–8.0)
Protein, UA: NEGATIVE
RBC UA: NEGATIVE
Spec Grav, UA: 1.015 (ref 1.010–1.025)
UROBILINOGEN UA: 0.2 U/dL

## 2017-12-13 NOTE — Progress Notes (Signed)
ROB- pt is c/o low back pain, having some low back pain

## 2017-12-13 NOTE — Patient Instructions (Signed)
Common Medications Safe in Pregnancy  Acne:      Constipation:  Benzoyl Peroxide     Colace  Clindamycin      Dulcolax Suppository  Topica Erythromycin     Fibercon  Salicylic Acid      Metamucil         Miralax AVOID:        Senakot   Accutane    Cough:  Retin-A       Cough Drops  Tetracycline      Phenergan w/ Codeine if Rx  Minocycline      Robitussin (Plain & DM)  Antibiotics:     Crabs/Lice:  Ceclor       RID  Cephalosporins    AVOID:  E-Mycins      Kwell  Keflex  Macrobid/Macrodantin   Diarrhea:  Penicillin      Kao-Pectate  Zithromax      Imodium AD         PUSH FLUIDS AVOID:       Cipro     Fever:  Tetracycline      Tylenol (Regular or Extra  Minocycline       Strength)  Levaquin      Extra Strength-Do not          Exceed 8 tabs/24 hrs Caffeine:        <200mg/day (equiv. To 1 cup of coffee or  approx. 3 12 oz sodas)         Gas: Cold/Hayfever:       Gas-X  Benadryl      Mylicon  Claritin       Phazyme  **Claritin-D        Chlor-Trimeton    Headaches:  Dimetapp      ASA-Free Excedrin  Drixoral-Non-Drowsy     Cold Compress  Mucinex (Guaifenasin)     Tylenol (Regular or Extra  Sudafed/Sudafed-12 Hour     Strength)  **Sudafed PE Pseudoephedrine   Tylenol Cold & Sinus     Vicks Vapor Rub  Zyrtec  **AVOID if Problems With Blood Pressure         Heartburn: Avoid lying down for at least 1 hour after meals  Aciphex      Maalox     Rash:  Milk of Magnesia     Benadryl    Mylanta       1% Hydrocortisone Cream  Pepcid  Pepcid Complete   Sleep Aids:  Prevacid      Ambien   Prilosec       Benadryl  Rolaids       Chamomile Tea  Tums (Limit 4/day)     Unisom  Zantac       Tylenol PM         Warm milk-add vanilla or  Hemorrhoids:       Sugar for taste  Anusol/Anusol H.C.  (RX: Analapram 2.5%)  Sugar Substitutes:  Hydrocortisone OTC     Ok in moderation  Preparation H      Tucks        Vaseline lotion applied to tissue with  wiping    Herpes:     Throat:  Acyclovir      Oragel  Famvir  Valtrex     Vaccines:         Flu Shot Leg Cramps:       *Gardasil  Benadryl      Hepatitis A         Hepatitis B Nasal Spray:         Pneumovax  Saline Nasal Spray     Polio Booster         Tetanus Nausea:       Tuberculosis test or PPD  Vitamin B6 25 mg TID   AVOID:    Dramamine      *Gardasil  Emetrol       Live Poliovirus  Ginger Root 250 mg QID    MMR (measles, mumps &  High Complex Carbs @ Bedtime    rebella)  Sea Bands-Accupressure    Varicella (Chickenpox)  Unisom 1/2 tab TID     *No known complications           If received before Pain:         Known pregnancy;   Darvocet       Resume series after  Lortab        Delivery  Percocet    Yeast:   Tramadol      Femstat  Tylenol 3      Gyne-lotrimin  Ultram       Monistat  Vicodin           MISC:         All Sunscreens           Hair Coloring/highlights          Insect Repellant's          (Including DEET)         Mystic Tans Back Pain in Pregnancy Back pain during pregnancy is common. Back pain may be caused by several factors that are related to changes during your pregnancy. Follow these instructions at home: Managing pain, stiffness, and swelling  If directed, apply ice for sudden (acute) back pain. ? Put ice in a plastic bag. ? Place a towel between your skin and the bag. ? Leave the ice on for 20 minutes, 2-3 times per day.  If directed, apply heat to the affected area before you exercise: ? Place a towel between your skin and the heat pack or heating pad. ? Leave the heat on for 20-30 minutes. ? Remove the heat if your skin turns bright red. This is especially important if you are unable to feel pain, heat, or cold. You may have a greater risk of getting burned. Activity  Exercise as told by your health care provider. Exercising is the best way to prevent or manage back pain.  Listen to your body when lifting. If lifting hurts, ask for help or  bend your knees. This uses your leg muscles instead of your back muscles.  Squat down when picking up something from the floor. Do not bend over.  Only use bed rest as told by your health care provider. Bed rest should only be used for the most severe episodes of back pain. Standing, Sitting, and Lying Down  Do not stand in one place for long periods of time.  Use good posture when sitting. Make sure your head rests over your shoulders and is not hanging forward. Use a pillow on your lower back if necessary.  Try sleeping on your side, preferably the left side, with a pillow or two between your legs. If you are sore after a night's rest, your bed may be too soft. A firm mattress may provide more support for your back during pregnancy. General instructions  Do not wear high heels.  Eat a healthy diet. Try to gain weight within your health care provider's recommendations.  Use a maternity girdle, elastic sling, or   back brace as told by your health care provider.  Take over-the-counter and prescription medicines only as told by your health care provider.  Keep all follow-up visits as told by your health care provider. This is important. This includes any visits with any specialists, such as a physical therapist. Contact a health care provider if:  Your back pain interferes with your daily activities.  You have increasing pain in other parts of your body. Get help right away if:  You develop numbness, tingling, weakness, or problems with the use of your arms or legs.  You develop severe back pain that is not controlled with medicine.  You have a sudden change in bowel or bladder control.  You develop shortness of breath, dizziness, or you faint.  You develop nausea, vomiting, or sweating.  You have back pain that is a rhythmic, cramping pain similar to labor pains. Labor pain is usually 1-2 minutes apart, lasts for about 1 minute, and involves a bearing down feeling or pressure in  your pelvis.  You have back pain and your water breaks or you have vaginal bleeding.  You have back pain or numbness that travels down your leg.  Your back pain developed after you fell.  You develop pain on one side of your back.  You see blood in your urine.  You develop skin blisters in the area of your back pain. This information is not intended to replace advice given to you by your health care provider. Make sure you discuss any questions you have with your health care provider. Document Released: 01/30/2006 Document Revised: 03/29/2016 Document Reviewed: 07/06/2015 Elsevier Interactive Patient Education  [employer].

## 2017-12-17 NOTE — Progress Notes (Signed)
ROB-Doing well, reports low back pain and fatigue. Discussed home treatment measures. Sample birth plan template given. Reviewed red flag symptoms and when to call. RTC x 2 weeks for Growth US due to marginal cord insertion and ROB with Pattricia Bossnnie or sooner if needed.

## 2017-12-20 ENCOUNTER — Encounter: Payer: Self-pay | Admitting: Certified Nurse Midwife

## 2017-12-27 ENCOUNTER — Encounter: Payer: Federal, State, Local not specified - PPO | Admitting: Certified Nurse Midwife

## 2017-12-27 ENCOUNTER — Other Ambulatory Visit: Payer: Federal, State, Local not specified - PPO

## 2017-12-30 ENCOUNTER — Ambulatory Visit (INDEPENDENT_AMBULATORY_CARE_PROVIDER_SITE_OTHER): Payer: Federal, State, Local not specified - PPO

## 2017-12-30 ENCOUNTER — Ambulatory Visit (INDEPENDENT_AMBULATORY_CARE_PROVIDER_SITE_OTHER): Payer: Federal, State, Local not specified - PPO | Admitting: Certified Nurse Midwife

## 2017-12-30 VITALS — BP 125/73 | HR 104 | Wt 189.2 lb

## 2017-12-30 DIAGNOSIS — Z3493 Encounter for supervision of normal pregnancy, unspecified, third trimester: Secondary | ICD-10-CM

## 2017-12-30 LAB — POCT URINALYSIS DIPSTICK
Bilirubin, UA: NEGATIVE
Glucose, UA: NEGATIVE
KETONES UA: NEGATIVE
Leukocytes, UA: NEGATIVE
NITRITE UA: NEGATIVE
PH UA: 7 (ref 5.0–8.0)
PROTEIN UA: NEGATIVE
RBC UA: NEGATIVE
UROBILINOGEN UA: 0.2 U/dL

## 2017-12-30 NOTE — Progress Notes (Signed)
ROB, doing well. Complains of some swelling in her feet. Self help measures discussed. She verbalizes understanding and agrees to plan. She asks about HSV preventative. Discussed that she would begin to take it daily at 36 wks unless she has outbreak prior to that time. She will return this afternoon for u/s to look a cord insertion. ROB in 2 wks.   Doreene BurkeAnnie Kariyah Baugh, CNM

## 2017-12-30 NOTE — Progress Notes (Signed)
Pt is here for an ROB visit. C/o swelling in hands and feet.

## 2017-12-30 NOTE — Patient Instructions (Signed)
Cord Blood Banking Information  Cord blood banking is the process of collecting and storing the blood that is in the umbilical cord and placenta at the time of delivery. This blood contains stem cells, which can be used to treat many blood diseases, immune system disorders, and childhood cancers. Stem cells can also be used to research certain diseases and treatments.  Many people who choose cord blood banking donate the blood. Donated blood can be used in lifesaving treatments or for research. Other people choose to store the blood privately. Blood that is stored privately can only be used with the person's permission. This option is often chosen if:  · A family member needs a stem cell transplant.  · The child is part of an ethnic minority.  · The child was conceived through in vitro fertilization.    What should I look for in a blood bank?  A blood bank is the organization that coordinates cord blood banking. Make sure the cord blood bank that you use:  · Is accredited.  · Is financially stable.  · Handles a large volume of cord blood samples.  · Has a procedure in place for transport and storage.  · Allows you the option of transferring your cord blood sample.  · Has a procedure in place if the bank goes out of business.  · Clearly states all costs and limits to future costs.    People who choose to donate cord blood should not need to pay for blood banking. People who keep the blood for private use will need to pay for the first (initial) storage and pay a fee each year (annual fee). Other fees may also apply.  What are the risks of cord blood banking?  There are no health risks associated with cord blood banking. It is considered safe.  How should I prepare?  You must schedule this process at least 4-6 weeks before you will be giving birth.  How is the blood collected?  The blood is collected as soon as the baby has been delivered. Within 15 minutes of delivery, a health care provider will take these actions  to collect the blood:  · Clamp the umbilical cord at the top and bottom. This traps the blood in the umbilical cord.  · Use a syringe or bag to collect the blood.  · Insert needles into the placenta to collect (draw out) more blood.    What happens after the blood is collected?  After the blood has been collected:  · The blood will be sent to a blood bank.  · The blood will be tested for genetic problems and infectious diseases. If the blood tests positive for a genetic problem or a disease, someone will contact you and let you know.  · The blood will be frozen.    If your child develops a genetic condition, immune system disorder, or cancer, you will be responsible for contacting the blood bank and letting them know.  This information is not intended to replace advice given to you by your health care provider. Make sure you discuss any questions you have with your health care provider.  Document Released: 04/11/2010 Document Revised: 03/29/2016 Document Reviewed: 04/11/2015  Elsevier Interactive Patient Education © 2018 Elsevier Inc.

## 2017-12-31 ENCOUNTER — Other Ambulatory Visit: Payer: Self-pay | Admitting: Certified Nurse Midwife

## 2017-12-31 DIAGNOSIS — O365930 Maternal care for other known or suspected poor fetal growth, third trimester, not applicable or unspecified: Secondary | ICD-10-CM

## 2017-12-31 NOTE — Progress Notes (Signed)
Dr. Logan BoresEvans consulted on ultrasound results. Plan for MFM consult to confirm u/s results and get recommendation on care plan. Weekly NST's . Pt notified via phone message.   Doreene BurkeAnnie Datron Brakebill, CNM

## 2018-01-01 ENCOUNTER — Other Ambulatory Visit: Payer: Federal, State, Local not specified - PPO

## 2018-01-01 ENCOUNTER — Encounter: Payer: Self-pay | Admitting: Certified Nurse Midwife

## 2018-01-01 ENCOUNTER — Other Ambulatory Visit: Payer: Self-pay | Admitting: Certified Nurse Midwife

## 2018-01-01 ENCOUNTER — Ambulatory Visit (INDEPENDENT_AMBULATORY_CARE_PROVIDER_SITE_OTHER): Payer: Federal, State, Local not specified - PPO | Admitting: Certified Nurse Midwife

## 2018-01-01 DIAGNOSIS — Z3403 Encounter for supervision of normal first pregnancy, third trimester: Secondary | ICD-10-CM | POA: Diagnosis not present

## 2018-01-01 DIAGNOSIS — Z3689 Encounter for other specified antenatal screening: Secondary | ICD-10-CM

## 2018-01-01 NOTE — Progress Notes (Signed)
NST for growth @ 33wks 2 days  Baseline: 145 Moderate variability  Accelerations present Decelerations absent Contractions absent.   Reactive NST.  Follow up as scheduled.   Doreene BurkeAnnie Howie Rufus, CNM

## 2018-01-01 NOTE — Patient Instructions (Signed)
Nonstress Test The nonstress test is a procedure that monitors the fetus's heartbeat. The test will monitor the heartbeat when the fetus is at rest and while the fetus is moving. In a healthy fetus, there will be an increase in fetal heart rate when the fetus moves or kicks. The heart rate will decrease at rest. This test helps determine if the fetus is healthy. Your health care provider will look at a number of patterns in the heart rate tracing to make sure your baby is thriving. If there is concern, your health care provider may order additional tests or may suggest another course of action. This test is often done in the third trimester and can help determine if an early delivery is needed and safe. Common reasons to have this test are:  You are past your due date.  You have a high-risk pregnancy.  You are feeling less movement than normal.  You have lost a pregnancy in the past.  Your health care provider suspects fetal growth problems.  You have too much or too little amniotic fluid.  What happens before the procedure?  Eat a meal right before the test or as directed by your health care provider. Food may help stimulate fetal movements.  Use the restroom right before the test. What happens during the procedure?  Two belts will be placed around your abdomen. These belts have monitors attached to them. One records the fetal heart rate and the other records uterine contractions.  You may be asked to lie down on your side or to stay sitting upright.  You may be given a button to press when you feel movement.  The fetal heartbeat is listened to and watched on a screen. The heartbeat is recorded on a sheet of paper.  If the fetus seems to be sleeping, you may be asked to drink some juice or soda, gently press your abdomen, or make some noise to wake the fetus. What happens after the procedure? Your health care provider will discuss the test results with you and make recommendations  for the near future.  This information is not intended to replace advice given to you by your health care provider. Make sure you discuss any questions you have with your health care provider. This information is not intended to replace advice given to you by your health care provider. Make sure you discuss any questions you have with your health care provider. Document Released: 10/12/2002 Document Revised: 09/21/2016 Document Reviewed: 11/25/2012 Elsevier Interactive Patient Education  2018 Elsevier Inc.  

## 2018-01-08 ENCOUNTER — Other Ambulatory Visit: Payer: Federal, State, Local not specified - PPO

## 2018-01-08 ENCOUNTER — Ambulatory Visit (INDEPENDENT_AMBULATORY_CARE_PROVIDER_SITE_OTHER): Payer: Federal, State, Local not specified - PPO | Admitting: Certified Nurse Midwife

## 2018-01-08 VITALS — BP 125/74 | HR 96 | Wt 191.0 lb

## 2018-01-08 DIAGNOSIS — O288 Other abnormal findings on antenatal screening of mother: Secondary | ICD-10-CM | POA: Diagnosis not present

## 2018-01-08 NOTE — Progress Notes (Signed)
Donovan Kailiana L Calvario 07-01-1991 2742w2d  Fetus A Non-Stress Test Interpretation for 01/08/18  Indication: marginal cord insertion    NST reactive   Baseline 140 Variability: moderate Accelerations: present Decelerations: absent  Follow up as scheduled  Doreene BurkeAnnie Terrance Lanahan, CNM

## 2018-01-08 NOTE — Patient Instructions (Signed)
Nonstress Test The nonstress test is a procedure that monitors the fetus's heartbeat. The test will monitor the heartbeat when the fetus is at rest and while the fetus is moving. In a healthy fetus, there will be an increase in fetal heart rate when the fetus moves or kicks. The heart rate will decrease at rest. This test helps determine if the fetus is healthy. Your health care provider will look at a number of patterns in the heart rate tracing to make sure your baby is thriving. If there is concern, your health care provider may order additional tests or may suggest another course of action. This test is often done in the third trimester and can help determine if an early delivery is needed and safe. Common reasons to have this test are:  You are past your due date.  You have a high-risk pregnancy.  You are feeling less movement than normal.  You have lost a pregnancy in the past.  Your health care provider suspects fetal growth problems.  You have too much or too little amniotic fluid.  What happens before the procedure?  Eat a meal right before the test or as directed by your health care provider. Food may help stimulate fetal movements.  Use the restroom right before the test. What happens during the procedure?  Two belts will be placed around your abdomen. These belts have monitors attached to them. One records the fetal heart rate and the other records uterine contractions.  You may be asked to lie down on your side or to stay sitting upright.  You may be given a button to press when you feel movement.  The fetal heartbeat is listened to and watched on a screen. The heartbeat is recorded on a sheet of paper.  If the fetus seems to be sleeping, you may be asked to drink some juice or soda, gently press your abdomen, or make some noise to wake the fetus. What happens after the procedure? Your health care provider will discuss the test results with you and make recommendations  for the near future.  This information is not intended to replace advice given to you by your health care provider. Make sure you discuss any questions you have with your health care provider. This information is not intended to replace advice given to you by your health care provider. Make sure you discuss any questions you have with your health care provider. Document Released: 10/12/2002 Document Revised: 09/21/2016 Document Reviewed: 11/25/2012 Elsevier Interactive Patient Education  2018 Elsevier Inc.  

## 2018-01-09 ENCOUNTER — Ambulatory Visit
Admission: RE | Admit: 2018-01-09 | Discharge: 2018-01-09 | Disposition: A | Discharge status: Home / Self Care | Payer: Federal, State, Local not specified - PPO | Source: Ambulatory Visit | Attending: Maternal & Fetal Medicine | Admitting: Maternal & Fetal Medicine

## 2018-01-09 DIAGNOSIS — Z369 Encounter for antenatal screening, unspecified: Secondary | ICD-10-CM | POA: Insufficient documentation

## 2018-01-09 DIAGNOSIS — Z3A34 34 weeks gestation of pregnancy: Secondary | ICD-10-CM | POA: Insufficient documentation

## 2018-01-09 DIAGNOSIS — Z3689 Encounter for other specified antenatal screening: Secondary | ICD-10-CM

## 2018-01-10 ENCOUNTER — Other Ambulatory Visit: Payer: Self-pay | Admitting: Certified Nurse Midwife

## 2018-01-10 MED ORDER — VALACYCLOVIR HCL 1 G PO TABS: 1000.0000 mg | ORAL_TABLET | Freq: Every day | ORAL | 3 refills | Status: DC

## 2018-01-10 NOTE — Progress Notes (Signed)
Pt placed on valtrex for HSV suppression due to induction scheduled on 3/25.  Doreene BurkeAnnie Tyaira Heward, CNM

## 2018-01-13 ENCOUNTER — Other Ambulatory Visit: Payer: Federal, State, Local not specified - PPO

## 2018-01-13 ENCOUNTER — Ambulatory Visit (INDEPENDENT_AMBULATORY_CARE_PROVIDER_SITE_OTHER): Payer: Federal, State, Local not specified - PPO | Admitting: Certified Nurse Midwife

## 2018-01-13 ENCOUNTER — Other Ambulatory Visit: Payer: Self-pay | Admitting: *Deleted

## 2018-01-13 VITALS — BP 115/67 | HR 86 | Wt 191.0 lb

## 2018-01-13 DIAGNOSIS — O43199 Other malformation of placenta, unspecified trimester: Secondary | ICD-10-CM

## 2018-01-13 DIAGNOSIS — Z3493 Encounter for supervision of normal pregnancy, unspecified, third trimester: Secondary | ICD-10-CM | POA: Diagnosis not present

## 2018-01-13 LAB — POCT URINALYSIS DIPSTICK
Bilirubin, UA: NEGATIVE
Blood, UA: NEGATIVE
Glucose, UA: NEGATIVE
KETONES UA: NEGATIVE
Leukocytes, UA: NEGATIVE
NITRITE UA: NEGATIVE
PH UA: 6 (ref 5.0–8.0)
PROTEIN UA: NEGATIVE
SPEC GRAV UA: 1.02 (ref 1.010–1.025)
UROBILINOGEN UA: 0.2 U/dL

## 2018-01-13 NOTE — Progress Notes (Signed)
Pt is here for an ROB and NST 

## 2018-01-13 NOTE — Patient Instructions (Signed)
Nonstress Test The nonstress test is a procedure that monitors the fetus's heartbeat. The test will monitor the heartbeat when the fetus is at rest and while the fetus is moving. In a healthy fetus, there will be an increase in fetal heart rate when the fetus moves or kicks. The heart rate will decrease at rest. This test helps determine if the fetus is healthy. Your health care provider will look at a number of patterns in the heart rate tracing to make sure your baby is thriving. If there is concern, your health care provider may order additional tests or may suggest another course of action. This test is often done in the third trimester and can help determine if an early delivery is needed and safe. Common reasons to have this test are:  You are past your due date.  You have a high-risk pregnancy.  You are feeling less movement than normal.  You have lost a pregnancy in the past.  Your health care provider suspects fetal growth problems.  You have too much or too little amniotic fluid.  What happens before the procedure?  Eat a meal right before the test or as directed by your health care provider. Food may help stimulate fetal movements.  Use the restroom right before the test. What happens during the procedure?  Two belts will be placed around your abdomen. These belts have monitors attached to them. One records the fetal heart rate and the other records uterine contractions.  You may be asked to lie down on your side or to stay sitting upright.  You may be given a button to press when you feel movement.  The fetal heartbeat is listened to and watched on a screen. The heartbeat is recorded on a sheet of paper.  If the fetus seems to be sleeping, you may be asked to drink some juice or soda, gently press your abdomen, or make some noise to wake the fetus. What happens after the procedure? Your health care provider will discuss the test results with you and make recommendations  for the near future.  This information is not intended to replace advice given to you by your health care provider. Make sure you discuss any questions you have with your health care provider. This information is not intended to replace advice given to you by your health care provider. Make sure you discuss any questions you have with your health care provider. Document Released: 10/12/2002 Document Revised: 09/21/2016 Document Reviewed: 11/25/2012 Elsevier Interactive Patient Education  2018 Elsevier Inc[employer] Fetal Movement Counts Patient Name: ________________________________________________ Patient Due Date: ____________________ What is a fetal movement count? A fetal movement count is the number of times that you feel your baby move during a certain amount of time. This may also be called a fetal kick count. A fetal movement count is recommended for every pregnant woman. You may be asked to start counting fetal movements as early as week 28 of your pregnancy. Pay attention to when your baby is most active. You may notice your baby's sleep and wake cycles. You may also notice things that make your baby move more. You should do a fetal movement count:  When your baby is normally most active.  At the same time each day.  A good time to count movements is while you are resting, after having something to eat and drink. How do I count fetal movements? 1. Find a quiet, comfortable area. Sit, or lie down on your side. 2. Write down the   date, the start time and stop time, and the number of movements that you felt between those two times. Take this information with you to your health care visits. 3. For 2 hours, count kicks, flutters, swishes, rolls, and jabs. You should feel at least 10 movements during 2 hours. 4. You may stop counting after you have felt 10 movements. 5. If you do not feel 10 movements in 2 hours, have something to eat and drink. Then, keep resting and counting for 1 hour. If you feel  at least 4 movements during that hour, you may stop counting. Contact a health care provider if:  You feel fewer than 4 movements in 2 hours.  Your baby is not moving like he or she usually does. Date: ____________ Start time: ____________ Stop time: ____________ Movements: ____________ Date: ____________ Start time: ____________ Stop time: ____________ Movements: ____________ Date: ____________ Start time: ____________ Stop time: ____________ Movements: ____________ Date: ____________ Start time: ____________ Stop time: ____________ Movements: ____________ Date: ____________ Start time: ____________ Stop time: ____________ Movements: ____________ Date: ____________ Start time: ____________ Stop time: ____________ Movements: ____________ Date: ____________ Start time: ____________ Stop time: ____________ Movements: ____________ Date: ____________ Start time: ____________ Stop time: ____________ Movements: ____________ Date: ____________ Start time: ____________ Stop time: ____________ Movements: ____________ This information is not intended to replace advice given to you by your health care provider. Make sure you discuss any questions you have with your health care provider. Document Released: 11/21/2006 Document Revised: 06/20/2016 Document Reviewed: 12/01/2015 Elsevier Interactive Patient Education  2018 Elsevier Inc.  

## 2018-01-14 NOTE — Progress Notes (Signed)
ROB-NST performed today was reviewed and was found to be reactive. Baseline 145 bpm with moderate variability, accelerations presents, and no decelerations noted. BPP, AFI, and Doppler studies scheduled at [facility] on Thursday, 01/16/2018. Anticipatory guidance regarding course of prenatal care including antenatal testing and methods for induction of labor. Encouraged patient to "take thing easy" until induction scheduled for 01/27/2018. Reviewed red flag symptoms and when to call.  Continue recommended antenatal testing and prenatal care. RTC x 2 week for NST and ROB or sooner if needed.

## 2018-01-16 ENCOUNTER — Other Ambulatory Visit: Payer: Self-pay | Admitting: *Deleted

## 2018-01-16 ENCOUNTER — Ambulatory Visit
Admission: RE | Admit: 2018-01-16 | Discharge: 2018-01-16 | Disposition: A | Discharge status: Home / Self Care | Payer: Federal, State, Local not specified - PPO | Source: Ambulatory Visit | Attending: Obstetrics and Gynecology | Admitting: Obstetrics and Gynecology

## 2018-01-16 DIAGNOSIS — Z3A35 35 weeks gestation of pregnancy: Secondary | ICD-10-CM | POA: Diagnosis not present

## 2018-01-16 DIAGNOSIS — O4100X0 Oligohydramnios, unspecified trimester, not applicable or unspecified: Secondary | ICD-10-CM | POA: Insufficient documentation

## 2018-01-16 DIAGNOSIS — O365930 Maternal care for other known or suspected poor fetal growth, third trimester, not applicable or unspecified: Secondary | ICD-10-CM

## 2018-01-16 DIAGNOSIS — O43193 Other malformation of placenta, third trimester: Secondary | ICD-10-CM | POA: Insufficient documentation

## 2018-01-16 DIAGNOSIS — O43199 Other malformation of placenta, unspecified trimester: Secondary | ICD-10-CM | POA: Diagnosis not present

## 2018-01-16 DIAGNOSIS — O365990 Maternal care for other known or suspected poor fetal growth, unspecified trimester, not applicable or unspecified: Secondary | ICD-10-CM | POA: Insufficient documentation

## 2018-01-20 ENCOUNTER — Other Ambulatory Visit: Payer: Federal, State, Local not specified - PPO

## 2018-01-20 ENCOUNTER — Ambulatory Visit (INDEPENDENT_AMBULATORY_CARE_PROVIDER_SITE_OTHER): Payer: Federal, State, Local not specified - PPO | Admitting: Certified Nurse Midwife

## 2018-01-20 ENCOUNTER — Other Ambulatory Visit: Payer: Self-pay | Admitting: *Deleted

## 2018-01-20 VITALS — BP 112/84 | HR 85 | Wt 191.4 lb

## 2018-01-20 DIAGNOSIS — O365930 Maternal care for other known or suspected poor fetal growth, third trimester, not applicable or unspecified: Secondary | ICD-10-CM

## 2018-01-20 DIAGNOSIS — Z3403 Encounter for supervision of normal first pregnancy, third trimester: Secondary | ICD-10-CM | POA: Diagnosis not present

## 2018-01-20 LAB — POCT URINALYSIS DIPSTICK
BILIRUBIN UA: NEGATIVE
Blood, UA: NEGATIVE
Glucose, UA: NEGATIVE
KETONES UA: NEGATIVE
LEUKOCYTES UA: NEGATIVE
NITRITE UA: NEGATIVE
Protein, UA: NEGATIVE
Spec Grav, UA: 1.015 (ref 1.010–1.025)
Urobilinogen, UA: 0.2 E.U./dL
pH, UA: 6.5 (ref 5.0–8.0)

## 2018-01-20 NOTE — Progress Notes (Signed)
Pt is here for an ROB visit with NST.

## 2018-01-20 NOTE — Patient Instructions (Signed)
Labor Induction Labor induction is when steps are taken to cause a pregnant woman to begin the labor process. Most women go into labor on their own between 37 weeks and 42 weeks of the pregnancy. When this does not happen or when there is a medical need, methods may be used to induce labor. Labor induction causes a pregnant woman's uterus to contract. It also causes the cervix to soften (ripen), open (dilate), and thin out (efface). Usually, labor is not induced before 39 weeks of the pregnancy unless there is a problem with the baby or mother. Before inducing labor, your health care provider will consider a number of factors, including the following:  The medical condition of you and the baby.  How many weeks along you are.  The status of the baby's lung maturity.  The condition of the cervix.  The position of the baby. What are the reasons for labor induction? Labor may be induced for the following reasons:  The health of the baby or mother is at risk.  The pregnancy is overdue by 1 week or more.  The water breaks but labor does not start on its own.  The mother has a health condition or serious illness, such as high blood pressure, infection, placental abruption, or diabetes.  The amniotic fluid amounts are low around the baby.  The baby is distressed. Convenience or wanting the baby to be born on a certain date is not a reason for inducing labor. What methods are used for labor induction? Several methods of labor induction may be used, such as:  Prostaglandin medicine. This medicine causes the cervix to dilate and ripen. The medicine will also start contractions. It can be taken by mouth or by inserting a suppository into the vagina.  Inserting a thin tube (catheter) with a balloon on the end into the vagina to dilate the cervix. Once inserted, the balloon is expanded with water, which causes the cervix to open.  Stripping the membranes. Your health care provider separates  amniotic sac tissue from the cervix, causing the cervix to be stretched and causing the release of a hormone called progesterone. This may cause the uterus to contract. It is often done during an office visit. You will be sent home to wait for the contractions to begin. You will then come in for an induction.  Breaking the water. Your health care provider makes a hole in the amniotic sac using a small instrument. Once the amniotic sac breaks, contractions should begin. This may still take hours to see an effect.  Medicine to trigger or strengthen contractions. This medicine is given through an IV access tube inserted into a vein in your arm. All of the methods of induction, besides stripping the membranes, will be done in the hospital. Induction is done in the hospital so that you and the baby can be carefully monitored. How long does it take for labor to be induced? Some inductions can take up to 2-3 days. Depending on the cervix, it usually takes less time. It takes longer when you are induced early in the pregnancy or if this is your first pregnancy. If a mother is still pregnant and the induction has been going on for 2-3 days, either the mother will be sent home or a cesarean delivery will be needed. What are the risks associated with labor induction? Some of the risks of induction include:  Changes in fetal heart rate, such as too high, too low, or erratic.  Fetal distress.    Chance of infection for the mother and baby.  Increased chance of having a cesarean delivery.  Breaking off (abruption) of the placenta from the uterus (rare).  Uterine rupture (very rare). When induction is needed for medical reasons, the benefits of induction may outweigh the risks. What are some reasons for not inducing labor? Labor induction should not be done if:  It is shown that your baby does not tolerate labor.  You have had previous surgeries on your uterus, such as a myomectomy or the removal of  fibroids.  Your placenta lies very low in the uterus and blocks the opening of the cervix (placenta previa).  Your baby is not in a head-down position.  The umbilical cord drops down into the birth canal in front of the baby. This could cut off the baby's blood and oxygen supply.  You have had a previous cesarean delivery.  There are unusual circumstances, such as the baby being extremely premature. This information is not intended to replace advice given to you by your health care provider. Make sure you discuss any questions you have with your health care provider. Document Released: 03/13/2007 Document Revised: 03/29/2016 Document Reviewed: 05/21/2013 Elsevier Interactive Patient Education  2017 Elsevier Inc.  

## 2018-01-20 NOTE — Progress Notes (Signed)
ROB, doing well. No complaints. NST today reactive, category 1. Basline 145 , accels present, decels absent, moderate variability. No contractions. SVE today. Posterior, unable to feel presenting part. Difficult to feel cervix due to patient unable to tolerate. Has MFM consult on Thursday. [facility].   Doreene BurkeAnnie Omer Puccinelli, CNM

## 2018-01-21 LAB — GC/CHLAMYDIA PROBE AMP

## 2018-01-23 ENCOUNTER — Ambulatory Visit
Admission: RE | Admit: 2018-01-23 | Discharge: 2018-01-23 | Disposition: A | Discharge status: Home / Self Care | Payer: Federal, State, Local not specified - PPO | Source: Ambulatory Visit | Attending: Maternal & Fetal Medicine | Admitting: Maternal & Fetal Medicine

## 2018-01-23 DIAGNOSIS — Z3A36 36 weeks gestation of pregnancy: Secondary | ICD-10-CM | POA: Insufficient documentation

## 2018-01-23 DIAGNOSIS — O365930 Maternal care for other known or suspected poor fetal growth, third trimester, not applicable or unspecified: Secondary | ICD-10-CM | POA: Diagnosis not present

## 2018-01-26 ENCOUNTER — Other Ambulatory Visit: Payer: Self-pay | Admitting: Certified Nurse Midwife

## 2018-01-27 ENCOUNTER — Inpatient Hospital Stay: Payer: Federal, State, Local not specified - PPO | Admitting: Registered Nurse

## 2018-01-27 ENCOUNTER — Other Ambulatory Visit: Payer: Self-pay | Admitting: *Deleted

## 2018-01-27 ENCOUNTER — Encounter: Payer: Self-pay | Admitting: *Deleted

## 2018-01-27 ENCOUNTER — Inpatient Hospital Stay
Admission: EM | Admit: 2018-01-27 | Discharge: 2018-01-29 | DRG: 807 | Disposition: A | Discharge status: Home / Self Care | Payer: Federal, State, Local not specified - PPO | Attending: Obstetrics and Gynecology | Admitting: Obstetrics and Gynecology

## 2018-01-27 ENCOUNTER — Other Ambulatory Visit: Payer: Self-pay

## 2018-01-27 DIAGNOSIS — O43123 Velamentous insertion of umbilical cord, third trimester: Secondary | ICD-10-CM | POA: Diagnosis present

## 2018-01-27 DIAGNOSIS — O365930 Maternal care for other known or suspected poor fetal growth, third trimester, not applicable or unspecified: Secondary | ICD-10-CM | POA: Diagnosis not present

## 2018-01-27 DIAGNOSIS — K219 Gastro-esophageal reflux disease without esophagitis: Secondary | ICD-10-CM | POA: Diagnosis present

## 2018-01-27 DIAGNOSIS — Z3A37 37 weeks gestation of pregnancy: Secondary | ICD-10-CM | POA: Diagnosis not present

## 2018-01-27 DIAGNOSIS — O326XX0 Maternal care for compound presentation, not applicable or unspecified: Secondary | ICD-10-CM | POA: Diagnosis present

## 2018-01-27 DIAGNOSIS — O9962 Diseases of the digestive system complicating childbirth: Secondary | ICD-10-CM | POA: Diagnosis present

## 2018-01-27 LAB — URINE DRUG SCREEN, QUALITATIVE (ARMC ONLY)
Amphetamines, Ur Screen: NOT DETECTED
BARBITURATES, UR SCREEN: NOT DETECTED
BENZODIAZEPINE, UR SCRN: NOT DETECTED
CANNABINOID 50 NG, UR ~~LOC~~: NOT DETECTED
Cocaine Metabolite,Ur ~~LOC~~: NOT DETECTED
MDMA (ECSTASY) UR SCREEN: NOT DETECTED
Methadone Scn, Ur: NOT DETECTED
Opiate, Ur Screen: NOT DETECTED
Phencyclidine (PCP) Ur S: NOT DETECTED
TRICYCLIC, UR SCREEN: NOT DETECTED

## 2018-01-27 LAB — CBC
HEMATOCRIT: 37.2 % (ref 35.0–47.0)
HEMOGLOBIN: 12 g/dL (ref 12.0–16.0)
MCH: 27.4 pg (ref 26.0–34.0)
MCHC: 32.4 g/dL (ref 32.0–36.0)
MCV: 84.4 fL (ref 80.0–100.0)
Platelets: 262 10*3/uL (ref 150–440)
RBC: 4.4 MIL/uL (ref 3.80–5.20)
RDW: 17.2 % — AB (ref 11.5–14.5)
WBC: 7.3 10*3/uL (ref 3.6–11.0)

## 2018-01-27 LAB — CHLAMYDIA/NGC RT PCR (ARMC ONLY)
CHLAMYDIA TR: NOT DETECTED
N GONORRHOEAE: NOT DETECTED

## 2018-01-27 LAB — STREP GP B CULTURE+RFLX

## 2018-01-27 LAB — TYPE AND SCREEN
ABO/RH(D): O POS
Antibody Screen: NEGATIVE

## 2018-01-27 MED ORDER — ACETAMINOPHEN 325 MG PO TABS: 650.0000 mg | ORAL_TABLET | ORAL | Status: DC | PRN

## 2018-01-27 MED ORDER — LIDOCAINE HCL (PF) 1 % IJ SOLN
INTRAMUSCULAR | Status: DC | PRN
  Administered 2018-01-27: 3 mL via SUBCUTANEOUS

## 2018-01-27 MED ORDER — PRENATAL MULTIVITAMIN CH
1.0000 | ORAL_TABLET | Freq: Every day | ORAL | Status: DC
  Administered 2018-01-28 – 2018-01-29 (×2): 1 via ORAL
  Filled 2018-01-27 (×2): qty 1

## 2018-01-27 MED ORDER — TETANUS-DIPHTH-ACELL PERTUSSIS 5-2.5-18.5 LF-MCG/0.5 IM SUSP: 0.5000 mL | Freq: Once | INTRAMUSCULAR | Status: DC

## 2018-01-27 MED ORDER — LIDOCAINE HCL (PF) 1 % IJ SOLN: 30.0000 mL | INTRAMUSCULAR | Status: DC | PRN

## 2018-01-27 MED ORDER — OXYCODONE-ACETAMINOPHEN 5-325 MG PO TABS: 1.0000 | ORAL_TABLET | ORAL | Status: DC | PRN

## 2018-01-27 MED ORDER — BENZOCAINE-MENTHOL 20-0.5 % EX AERO
1.0000 "application " | INHALATION_SPRAY | CUTANEOUS | Status: DC | PRN
  Administered 2018-01-27: 1 via TOPICAL
  Filled 2018-01-27: qty 56

## 2018-01-27 MED ORDER — DIBUCAINE 1 % RE OINT: 1.0000 "application " | TOPICAL_OINTMENT | RECTAL | Status: DC | PRN

## 2018-01-27 MED ORDER — TERBUTALINE SULFATE 1 MG/ML IJ SOLN: 0.2500 mg | Freq: Once | INTRAMUSCULAR | Status: DC | PRN

## 2018-01-27 MED ORDER — OXYTOCIN 10 UNIT/ML IJ SOLN: 10.0000 [IU] | Freq: Once | INTRAMUSCULAR | Status: DC

## 2018-01-27 MED ORDER — DOCUSATE SODIUM 100 MG PO CAPS
100.0000 mg | ORAL_CAPSULE | Freq: Two times a day (BID) | ORAL | Status: DC
  Administered 2018-01-28 – 2018-01-29 (×2): 100 mg via ORAL
  Filled 2018-01-27 (×2): qty 1

## 2018-01-27 MED ORDER — NALBUPHINE HCL 10 MG/ML IJ SOLN
5.0000 mg | Freq: Once | INTRAMUSCULAR | Status: AC
  Administered 2018-01-27: 5 mg via INTRAVENOUS
  Filled 2018-01-27: qty 1

## 2018-01-27 MED ORDER — SODIUM CHLORIDE 0.9 % IV SOLN: INTRAVENOUS | Status: DC

## 2018-01-27 MED ORDER — METHYLERGONOVINE MALEATE 0.2 MG/ML IJ SOLN: 0.2000 mg | INTRAMUSCULAR | Status: DC | PRN

## 2018-01-27 MED ORDER — LACTATED RINGERS IV SOLN
500.0000 mL | INTRAVENOUS | Status: DC | PRN
  Administered 2018-01-27: 500 mL via INTRAVENOUS

## 2018-01-27 MED ORDER — BUPIVACAINE HCL (PF) 0.25 % IJ SOLN
INTRAMUSCULAR | Status: DC | PRN
  Administered 2018-01-27 (×2): 4 mL via EPIDURAL

## 2018-01-27 MED ORDER — CLINDAMYCIN PHOSPHATE 900 MG/50ML IV SOLN
900.0000 mg | Freq: Three times a day (TID) | INTRAVENOUS | Status: DC
  Administered 2018-01-27: 900 mg via INTRAVENOUS
  Filled 2018-01-27: qty 50

## 2018-01-27 MED ORDER — LIDOCAINE HCL (PF) 1 % IJ SOLN
INTRAMUSCULAR | Status: AC
  Filled 2018-01-27: qty 30

## 2018-01-27 MED ORDER — ONDANSETRON HCL 4 MG/2ML IJ SOLN: 4.0000 mg | INTRAMUSCULAR | Status: DC | PRN

## 2018-01-27 MED ORDER — COCONUT OIL OIL
1.0000 "application " | TOPICAL_OIL | Status: DC | PRN
  Administered 2018-01-28: 1 via TOPICAL
  Filled 2018-01-27: qty 120

## 2018-01-27 MED ORDER — OXYCODONE-ACETAMINOPHEN 5-325 MG PO TABS: 2.0000 | ORAL_TABLET | ORAL | Status: DC | PRN

## 2018-01-27 MED ORDER — FENTANYL 2.5 MCG/ML W/ROPIVACAINE 0.15% IN NS 100 ML EPIDURAL (ARMC)
EPIDURAL | Status: AC
  Filled 2018-01-27: qty 100

## 2018-01-27 MED ORDER — SIMETHICONE 80 MG PO CHEW
80.0000 mg | CHEWABLE_TABLET | ORAL | Status: DC | PRN
Start: 2018-01-27 — End: 2018-01-29
  Administered 2018-01-28: 80 mg via ORAL
  Filled 2018-01-27: qty 1

## 2018-01-27 MED ORDER — OXYTOCIN 40 UNITS IN LACTATED RINGERS INFUSION - SIMPLE MED
2.5000 [IU]/h | INTRAVENOUS | Status: DC
  Filled 2018-01-27 (×2): qty 1000

## 2018-01-27 MED ORDER — OXYTOCIN BOLUS FROM INFUSION
500.0000 mL | Freq: Once | INTRAVENOUS | Status: AC
  Administered 2018-01-27: 500 mL via INTRAVENOUS

## 2018-01-27 MED ORDER — IBUPROFEN 600 MG PO TABS
600.0000 mg | ORAL_TABLET | Freq: Four times a day (QID) | ORAL | Status: DC
  Administered 2018-01-28 – 2018-01-29 (×4): 600 mg via ORAL
  Filled 2018-01-27 (×4): qty 1

## 2018-01-27 MED ORDER — FENTANYL 2.5 MCG/ML W/ROPIVACAINE 0.15% IN NS 100 ML EPIDURAL (ARMC)
EPIDURAL | Status: DC | PRN
  Administered 2018-01-27: 12 mL/h via EPIDURAL

## 2018-01-27 MED ORDER — METHYLERGONOVINE MALEATE 0.2 MG PO TABS: 0.2000 mg | ORAL_TABLET | ORAL | Status: DC | PRN

## 2018-01-27 MED ORDER — WITCH HAZEL-GLYCERIN EX PADS: 1.0000 "application " | MEDICATED_PAD | CUTANEOUS | Status: DC | PRN

## 2018-01-27 MED ORDER — BUTORPHANOL TARTRATE 1 MG/ML IJ SOLN
1.0000 mg | INTRAMUSCULAR | Status: DC | PRN
  Administered 2018-01-27: 1 mg via INTRAVENOUS
  Filled 2018-01-27 (×2): qty 1

## 2018-01-27 MED ORDER — AMMONIA AROMATIC IN INHA
RESPIRATORY_TRACT | Status: AC
  Filled 2018-01-27: qty 10

## 2018-01-27 MED ORDER — SENNOSIDES-DOCUSATE SODIUM 8.6-50 MG PO TABS: 2.0000 | ORAL_TABLET | ORAL | Status: DC

## 2018-01-27 MED ORDER — ONDANSETRON HCL 4 MG/2ML IJ SOLN: 4.0000 mg | Freq: Four times a day (QID) | INTRAMUSCULAR | Status: DC | PRN

## 2018-01-27 MED ORDER — ONDANSETRON HCL 4 MG PO TABS: 4.0000 mg | ORAL_TABLET | ORAL | Status: DC | PRN

## 2018-01-27 MED ORDER — FERROUS SULFATE 325 (65 FE) MG PO TABS
325.0000 mg | ORAL_TABLET | Freq: Every day | ORAL | Status: DC
  Administered 2018-01-28 – 2018-01-29 (×2): 325 mg via ORAL
  Filled 2018-01-27 (×3): qty 1

## 2018-01-27 MED ORDER — DIPHENHYDRAMINE HCL 25 MG PO CAPS: 25.0000 mg | ORAL_CAPSULE | Freq: Four times a day (QID) | ORAL | Status: DC | PRN

## 2018-01-27 MED ORDER — MISOPROSTOL 50MCG HALF TABLET
50.0000 ug | ORAL_TABLET | ORAL | Status: DC | PRN
  Administered 2018-01-27 (×2): 50 ug via VAGINAL
  Filled 2018-01-27 (×2): qty 1

## 2018-01-27 MED ORDER — LACTATED RINGERS IV SOLN
INTRAVENOUS | Status: DC
  Administered 2018-01-27: 06:00:00 via INTRAVENOUS

## 2018-01-27 MED ORDER — OXYTOCIN 10 UNIT/ML IJ SOLN
INTRAMUSCULAR | Status: AC
  Filled 2018-01-27: qty 2

## 2018-01-27 MED ORDER — LORATADINE 10 MG PO TABS
10.0000 mg | ORAL_TABLET | Freq: Every day | ORAL | Status: DC
  Administered 2018-01-27 – 2018-01-29 (×3): 10 mg via ORAL
  Filled 2018-01-27 (×4): qty 1

## 2018-01-27 MED ORDER — SOD CITRATE-CITRIC ACID 500-334 MG/5ML PO SOLN: 30.0000 mL | ORAL | Status: DC | PRN

## 2018-01-27 MED ORDER — MISOPROSTOL 200 MCG PO TABS
ORAL_TABLET | ORAL | Status: AC
  Administered 2018-01-27: 50 ug via VAGINAL
  Filled 2018-01-27: qty 4

## 2018-01-27 MED ORDER — LIDOCAINE-EPINEPHRINE (PF) 1.5 %-1:200000 IJ SOLN
INTRAMUSCULAR | Status: DC | PRN
  Administered 2018-01-27: 3 mL via EPIDURAL

## 2018-01-27 NOTE — Anesthesia Preprocedure Evaluation (Signed)
Anesthesia Evaluation  Patient identified by MRN, date of birth, ID band Patient awake    Reviewed: Allergy & Precautions, NPO status , Patient's Chart, lab work & pertinent test results  Airway Mallampati: III  TM Distance: >3 FB Neck ROM: full    Dental  (+) Teeth Intact   Pulmonary neg pulmonary ROS,    Pulmonary exam normal        Cardiovascular negative cardio ROS Normal cardiovascular exam     Neuro/Psych negative neurological ROS  negative psych ROS   GI/Hepatic Neg liver ROS, GERD  Medicated,  Endo/Other  negative endocrine ROS  Renal/GU negative Renal ROS  negative genitourinary   Musculoskeletal   Abdominal   Peds  Hematology negative hematology ROS (+)   Anesthesia Other Findings   Reproductive/Obstetrics (+) Pregnancy                             Anesthesia Physical Anesthesia Plan  ASA: II  Anesthesia Plan: Epidural   Post-op Pain Management:    Induction:   PONV Risk Score and Plan:   Airway Management Planned:   Additional Equipment:   Intra-op Plan:   Post-operative Plan:   Informed Consent: I have reviewed the patients History and Physical, chart, labs and discussed the procedure including the risks, benefits and alternatives for the proposed anesthesia with the patient or authorized representative who has indicated his/her understanding and acceptance.     Plan Discussed with: Anesthesiologist and CRNA  Anesthesia Plan Comments:         Anesthesia Quick Evaluation

## 2018-01-27 NOTE — H&P (Signed)
Obstetric History and Physical  Rebecca Rollins is a 27 y.o. G1P0000 with IUP at 786w0d presenting for induction of labor at 37 weeks due to IUGR. Patient states she has been having  none contractions, none vaginal bleeding, intact membranes, with active fetal movement.    Denies difficulty breathing or respiratory distress, chest pain, abdominal pain, vaginal bleeding, dysuria, and leg pain or swelling.   Prenatal Course  Source of Care: [facility]-initial visit at 12 wks, total: 9  Pregnancy complications or risks: history marijuana use, marginal cord insertion, history of HSV, IUGR < 3 percentile  Prenatal labs and studies:  ABO, Rh: --/--/PENDING (03/25 16100612)  Antibody: PENDING (03/25 96040612)  Varicella: 342 (09/10 1425)  Rubella: <0.90 (09/10 1425)  RPR: Non Reactive (01/25 0940)   HBsAg: Negative (09/10 1425)   HIV: Non Reactive (09/10 1425)   GBS: unknown  1 hr Glucola: 90 (01/25 0940)  Genetic screening: Negative (10/03 1309)  Anatomy US: Complete (11/28 1250)  Past Medical History:  Diagnosis Date  . Reflux     Past Surgical History:  Procedure Laterality Date  . MOUTH SURGERY  2011    OB History  Gravida Para Term Preterm AB Living  1 0 0 0 0 0  SAB TAB Ectopic Multiple Live Births  0 0 0 0 0    # Outcome Date GA Lbr Len/2nd Weight Sex Delivery Anes PTL Lv  1 Current             Social History   Socioeconomic History  . Marital status: Single    Spouse name: Not on file  . Number of children: Not on file  . Years of education: Not on file  . Highest education level: Not on file  Occupational History  . Not on file  Social Needs  . Financial resource strain: Not on file  . Food insecurity:    Worry: Not on file    Inability: Not on file  . Transportation needs:    Medical: Not on file    Non-medical: Not on file  Tobacco Use  . Smoking status: Never Smoker  . Smokeless tobacco: Never Used  Substance and Sexual Activity  . Alcohol use: No     Alcohol/week: 0.0 oz    Frequency: Never  . Drug use: No  . Sexual activity: Yes  Lifestyle  . Physical activity:    Days per week: Not on file    Minutes per session: Not on file  . Stress: Not on file  Relationships  . Social connections:    Talks on phone: Not on file    Gets together: Not on file    Attends religious service: Not on file    Active member of club or organization: Not on file    Attends meetings of clubs or organizations: Not on file    Relationship status: Not on file  Other Topics Concern  . Not on file  Social History Narrative   Exercise: only in gym   Loves TV and phone   Sexually active...first at [age]    Family History  Problem Relation Age of Onset  . Hypertension Mother   . Anemia Mother   . Diabetes Maternal Uncle   . Hypertension Maternal Grandmother   . Hypertension Maternal Grandfather     Medications Prior to Admission  Medication Sig Dispense Refill Last Dose  . ferrous sulfate 325 (65 FE) MG EC tablet Take 325 mg by mouth 3 (three) times daily with  meals.   01/26/2018 at Unknown time  . pantoprazole (PROTONIX) 40 MG tablet Take 1 tablet (40 mg total) by mouth daily. 30 tablet 6 Taking  . Prenatal Vit-Fe Fumarate-FA (PRENATAL MULTIVITAMIN) TABS tablet Take 1 tablet by mouth daily at 12 noon.   Taking  . valACYclovir (VALTREX) 1000 MG tablet Take 1 tablet (1,000 mg total) by mouth daily. 30 tablet 3 Taking    Allergies  Allergen Reactions  . Penicillins     REACTION: ? Rash REACTION: ? Rash    Review of Systems: Negative except for what is mentioned in HPI.  Physical Exam:  Temp:  [98.1 F (36.7 C)-98.3 F (36.8 C)] 98.3 F (36.8 C) (03/25 0717) Pulse Rate:  [89-91] 89 (03/25 0717) Resp:  [16-18] 16 (03/25 0717) BP: (117-125)/(75-82) 125/75 (03/25 0717) Weight:  [191 lb (86.6 kg)] 191 lb (86.6 kg) (03/25 0557)  GENERAL: Well-developed, well-nourished female in no acute distress.   LUNGS: Clear to auscultation  bilaterally.   HEART: Regular rate and rhythm.  ABDOMEN: Soft, nontender, nondistended, gravid.  EXTREMITIES: Nontender, no edema, 2+ distal pulses.  Cervical Exam: Dilation: Fingertip Effacement (%): Thick Station: -3 Exam by:: Willodean Rosenthal, CNM  FHT:  Baseline rate 140 bpm   Variability moderate  Accelerations present   Decelerations none  Contractions: Every five (5) to six (6) minutes, soft resting tone   Pertinent Labs/Studies:    Results for orders placed or performed during the hospital encounter of 01/27/18 (from the past 24 hour(s))  CBC     Status: Abnormal   Collection Time: 01/27/18  6:12 AM  Result Value Ref Range   WBC 7.3 3.6 - 11.0 K/uL   RBC 4.40 3.80 - 5.20 MIL/uL   Hemoglobin 12.0 12.0 - 16.0 g/dL   HCT 78.2 95.6 - 21.3 %   MCV 84.4 80.0 - 100.0 fL   MCH 27.4 26.0 - 34.0 pg   MCHC 32.4 32.0 - 36.0 g/dL   RDW 08.6 (H) 57.8 - 46.9 %   Platelets 262 150 - 440 K/uL  Type and screen     Status: None (Preliminary result)   Collection Time: 01/27/18  6:12 AM  Result Value Ref Range   ABO/RH(D) PENDING    Antibody Screen PENDING    Sample Expiration      01/30/2018 Performed at [facility], [address], [place], [place] [address]     Assessment :  Rebecca Rollins is a 27 y.o. G1P0000 at [redacted]w[redacted]d being admitted for induction of labor due to IUGR, Rh positive, GBS unknown  FHR Category I  Plan:  Admit to birthing suites, see orders.   Induction/Augmentation as needed, per protocol  Reviewed red flag symptoms and when to call.   Continue orders as written. Reassess as needed.   Dr. Valentino Saxon notified of admission.    Gunnar Bulla, CNM [facility], [facility]

## 2018-01-27 NOTE — Progress Notes (Signed)
LABOR NOTE   Rebecca Rollins 27 y.o.GP@ at 6240w0d Not in labor.  SUBJECTIVE:  More uncomfortable, intense menstrual like cramps  OBJECTIVE:  BP 135/81 (BP Location: Left Arm)   Pulse 87   Temp 98.4 F (36.9 C) (Oral)   Resp 18   Ht 5\' 2"  (1.575 m)   Wt 191 lb (86.6 kg)   LMP 05/13/2017   SpO2 98%   BMI 34.93 kg/m  Total I/O In: 625 [I.V.:625] Out: -    CERVIX: deferred SVE:   Dilation: Fingertip Effacement (%): Thick Station: -3 Exam by:: Smith[employer] FoodsJohnson RN CONTRACTIONS: regular, every 2-4 minutes FHR: Fetal heart tracing reviewed. Baseline: 145 bpm, Variability: Good {> 6 bpm), Accelerations: Non-reactive but appropriate for gestational age and Decelerations: Absent Category I   Analgesia: Nitrous Oxide , birthing ball  Labs: Lab Results  Component Value Date   WBC 7.3 01/27/2018   HGB 12.0 01/27/2018   HCT 37.2 01/27/2018   MCV 84.4 01/27/2018   PLT 262 01/27/2018    ASSESSMENT: 1) Labor curve reviewed.       Progress: Not in labor.     Membranes: intact      Active Problems:   Labor and delivery, indication for care Fetal Growth restriction   PLAN: continue present management  Rebecca Burkennie Ardice Rollins, CNM  01/27/2018 1:16 PM

## 2018-01-27 NOTE — Progress Notes (Signed)
LABOR NOTE   Rebecca Rollins 27 y.o.GP@ at 8241w0d Not in labor.  SUBJECTIVE:  Feeling cramps OBJECTIVE:  BP 125/75 (BP Location: Left Arm)   Pulse 89   Temp 98.3 F (36.8 C) (Oral)   Resp 16   Ht 5\' 2"  (1.575 m)   Wt 191 lb (86.6 kg)   LMP 05/13/2017   BMI 34.93 kg/m  Total I/O In: 375 [I.V.:375] Out: -    CERVIX: not examined SVE:   Dilation: Fingertip Effacement (%): Thick Station: -3 Exam by:: Willodean RosenthalM Lawhorn, CNM CONTRACTIONS: regular, every 2-4 minutes FHR: Fetal heart tracing reviewed. Baseline: 150 bpm, Variability: Good {> 6 bpm), Accelerations: Reactive and Decelerations: Absent Category I   Analgesia: Labor support without medications  Labs: Lab Results  Component Value Date   WBC 7.3 01/27/2018   HGB 12.0 01/27/2018   HCT 37.2 01/27/2018   MCV 84.4 01/27/2018   PLT 262 01/27/2018    ASSESSMENT: 1) Labor curve reviewed.       Progress: Not in labor.     Membranes: intact       Active Problems:   Labor and delivery, indication for care Fetal growth restriction  PLAN: continue present management. GBS swab re collected . Dr. Valentino Saxonherry consulted and agreed to plan to hold on antibiotic treatment until GBS resulted.   Doreene Burkennie Eiden Bagot, CNM  01/27/2018 10:07 AM

## 2018-01-27 NOTE — Anesthesia Procedure Notes (Signed)
Epidural Patient location during procedure: OB Start time: 01/27/2018 4:20 PM End time: 01/27/2018 4:30 PM  Staffing Anesthesiologist: Berdine Addisonhomas, Mathai, MD Resident/CRNA: Karoline CaldwellStarr, Yasir Kitner, CRNA Performed: resident/CRNA   Preanesthetic Checklist Completed: patient identified, site marked, surgical consent, pre-op evaluation, timeout performed, IV checked, risks and benefits discussed and monitors and equipment checked  Epidural Patient position: sitting Prep: ChloraPrep Patient monitoring: heart rate, continuous pulse ox and blood pressure Approach: midline Location: L4-L5 Injection technique: LOR saline  Needle:  Needle type: Tuohy  Needle gauge: 17 G Needle length: 9 cm and 9 Needle insertion depth: 6 cm Catheter type: closed end flexible Catheter size: 19 Gauge Catheter at skin depth: 11 cm Test dose: negative and 1.5% lidocaine with Epi 1:200 K  Assessment Events: blood not aspirated, injection not painful, no injection resistance, negative IV test and no paresthesia  Additional Notes Pt. Evaluated and documentation done after procedure finished. Patient identified. Risks/Benefits/Options discussed with patient including but not limited to bleeding, infection, nerve damage, paralysis, failed block, incomplete pain control, headache, blood pressure changes, nausea, vomiting, reactions to medication both or allergic, itching and postpartum back pain. Confirmed with bedside nurse the patient's most recent platelet count. Confirmed with patient that they are not currently taking any anticoagulation, have any bleeding history or any family history of bleeding disorders. Patient expressed understanding and wished to proceed. All questions were answered. Sterile technique was used throughout the entire procedure. Please see nursing notes for vital signs. Test dose was given through epidural catheter and negative prior to continuing to dose epidural or start infusion. Warning signs of high  block given to the patient including shortness of breath, tingling/numbness in hands, complete motor block, or any concerning symptoms with instructions to call for help. Patient was given instructions on fall risk and not to get out of bed. All questions and concerns addressed with instructions to call with any issues or inadequate analgesia.   Patient tolerated the insertion well without immediate complications.Reason for block:procedure for pain

## 2018-01-27 NOTE — Progress Notes (Signed)
LABOR NOTE   Rebecca Rollins 27 y.o.GP@ at 3842w0d Active phase labor.  SUBJECTIVE:  Pt comfortable with epidural  OBJECTIVE:  BP (!) 109/52   Pulse 84   Temp 98.6 F (37 C) (Oral)   Resp 18   Ht 5\' 2"  (1.575 m)   Wt 191 lb (86.6 kg)   LMP 05/13/2017   SpO2 99%   Breastfeeding? Unknown   BMI 34.93 kg/m  Total I/O In: 2337.3 [I.V.:2337.3] Out: 80 [Urine:80]  She has shown cervical change. CERVIX:See RN notes SVE:   Dilation: 10 Effacement (%): 100 Station: Plus 1 Exam by:: Rebecca Rollins, CNM CONTRACTIONS: regular, every 2-4 minutes FHR: Fetal heart tracing reviewed. Baseline: 150 bpm, Variability: Good {> 6 bpm), Accelerations: absen and Decelerations: Late, patient progressing quickly, IV bolus given, maternal position change, O2. IUPC placed.  Category II   Analgesia: Epidural  Labs: Lab Results  Component Value Date   WBC 7.3 01/27/2018   HGB 12.0 01/27/2018   HCT 37.2 01/27/2018   MCV 84.4 01/27/2018   PLT 262 01/27/2018    ASSESSMENT: 1) Labor curve reviewed.       Progress: Active phase labor.     Membranes: ruptured, clear fluid      2) IV fluid bolus, maternal position change, O2 via face mask, IUPC placed  3) Rapid progress in labor  Active Problems:   Labor and delivery, indication for care   PLAN: Anticipate SVD, Dr. Valentino Rollins notified of strip and requested to be present for possible vacuum assistance.   Rebecca Rollins, CNM  01/27/2018 6:24 PM

## 2018-01-27 NOTE — Progress Notes (Signed)
Spoke to pt around 0900 about visitation policy because pt had 6 visitors at the bedside. Told pt we had policy of 3 support people during labor. Told pt that around 1000 we would need to get down to 3 visitors per the unit policy and pt verbalized understanding. Went into room at 1000 and there were still 6 visitors at the bedside. Instructed on policy again giving explanations of why we only allow 3 visitors. Family asked to speak to charge nurse. Charge nurse/Shift coordinator notified of situation and request to be spoken to, so charge nurse handled the situation. I went in around 1100 and the family was very upset that security had come in and invaded their space. Family states "that has made their experience unpleasant". Spoke to midwife about visitors and midwife said she was comfortable with 4 visitors at the bedside until the pt had the 3rd dose of cytotec placed. Pt and visitors notified of conversation with Pattricia Bossnnie. Apologized for their unpleasant experience and explained that I would do everything I could to make their experience better in L&D.

## 2018-01-28 LAB — CBC
HEMATOCRIT: 37.5 % (ref 35.0–47.0)
HEMOGLOBIN: 12.2 g/dL (ref 12.0–16.0)
MCH: 27.6 pg (ref 26.0–34.0)
MCHC: 32.7 g/dL (ref 32.0–36.0)
MCV: 84.4 fL (ref 80.0–100.0)
Platelets: 228 10*3/uL (ref 150–440)
RBC: 4.44 MIL/uL (ref 3.80–5.20)
RDW: 17.5 % — ABNORMAL HIGH (ref 11.5–14.5)
WBC: 9.2 10*3/uL (ref 3.6–11.0)

## 2018-01-28 LAB — RPR: RPR: NONREACTIVE

## 2018-01-28 MED ORDER — FLUTICASONE PROPIONATE 50 MCG/ACT NA SUSP
1.0000 | Freq: Every day | NASAL | Status: DC
  Filled 2018-01-28: qty 16

## 2018-01-28 MED ORDER — NALBUPHINE HCL 10 MG/ML IJ SOLN
5.0000 mg | Freq: Once | INTRAMUSCULAR | Status: DC
  Filled 2018-01-28: qty 1

## 2018-01-28 MED ORDER — FLUTICASONE PROPIONATE 50 MCG/ACT NA SUSP
1.0000 | NASAL | Status: DC | PRN
  Administered 2018-01-28 (×2): 1 via NASAL
  Filled 2018-01-28: qty 16

## 2018-01-28 NOTE — Plan of Care (Signed)
Vs stable; up ad lib; tolerating regular diet; has declined motrin for pain; [facility]; baby allowed to come to pt's room for feeds/short visits

## 2018-01-28 NOTE — Lactation Note (Signed)
This note was copied from a baby's chart. Lactation Consultation Note  Patient Name: Rebecca Rollins     Maternal Data    Feeding Feeding Type: Formula Nipple Type: Slow - flow Length of feed: 35 min(Breast fed x 10 min 400ffered formula after breast feeding)  LATCH Score                   Interventions    Lactation Tools Discussed/Used Tools: Bottle   Consult Status  Mom states that the last 10min nursing session went well. She complained of sore nipple from pumping, but says the 27mm flanges are still fitting correctly. Leaving night RN with 30mm flanges just in case.     Rebecca Rollins Rollins, 10:06 PM

## 2018-01-28 NOTE — Progress Notes (Signed)
Progress Note - Vaginal Delivery  Rebecca Rollins is a 27 y.o. G1P1001 now PP day 1 s/p Vaginal, Spontaneous .   Subjective:  The patient reports no complaints, up ad lib, voiding, tolerating PO and + flatus  Objective:  Vital signs in last 24 hours: Temp:  [98.4 F (36.9 C)-98.9 F (37.2 C)] 98.5 F (36.9 C) (03/26 0833) Pulse Rate:  [69-109] 93 (03/26 0833) Resp:  [18-20] 20 (03/26 0833) BP: (92-137)/(27-95) 109/66 (03/26 0833) SpO2:  [97 %-100 %] 100 % (03/26 [id number])  Physical Exam:  General: alert, cooperative, appears stated age and no distress Lochia: appropriate Uterine Fundus: firm DVT Evaluation: No evidence of DVT seen on physical exam. No cords or calf tenderness. No significant calf/ankle edema.    Data Review Recent Labs    01/27/18 0612 01/28/18 0638  HGB 12.0 12.2  HCT 37.2 37.5    Assessment/Plan: Active Problems:   Labor and delivery, indication for care  Fetal Growth restriction   Plan for discharge tomorrow  -- Continue routine PP care.     Doreene Burkennie Iliza Blankenbeckler, CNM  01/28/2018 9:17 AM

## 2018-01-28 NOTE — Anesthesia Postprocedure Evaluation (Signed)
Anesthesia Post Note  Patient: Rebecca Rollins  Procedure(s) Performed: AN AD HOC LABOR EPIDURAL  Patient location during evaluation: Mother Baby Anesthesia Type: Epidural Level of consciousness: awake and alert Pain management: pain level controlled Vital Signs Assessment: post-procedure vital signs reviewed and stable Respiratory status: spontaneous breathing, nonlabored ventilation and respiratory function stable Cardiovascular status: stable Postop Assessment: no headache, no backache and epidural receding Anesthetic complications: no     Last Vitals:  Vitals:   01/27/18 2318 01/28/18 0304  BP: 112/62 (!) 112/53  Pulse: 86 84  Resp: 18 18  Temp: 36.9 C 36.9 C  SpO2: 99% 98%    Last Pain:  Vitals:   01/28/18 0729  TempSrc:   PainSc: 0-No pain                 Starling Mannsurtis,  Yuridiana Formanek A

## 2018-01-29 ENCOUNTER — Ambulatory Visit: Payer: Self-pay

## 2018-01-29 MED ORDER — MEASLES, MUMPS & RUBELLA VAC ~~LOC~~ INJ
0.5000 mL | INJECTION | Freq: Once | SUBCUTANEOUS | Status: AC
  Administered 2018-01-29: 0.5 mL via SUBCUTANEOUS
  Filled 2018-01-29 (×2): qty 0.5

## 2018-01-29 MED ORDER — PANTOPRAZOLE SODIUM 40 MG PO TBEC: 40.0000 mg | DELAYED_RELEASE_TABLET | ORAL | 6 refills | Status: DC | PRN

## 2018-01-29 MED ORDER — VALACYCLOVIR HCL 1 G PO TABS: 1000.0000 mg | ORAL_TABLET | ORAL | 3 refills | Status: DC | PRN

## 2018-01-29 NOTE — Progress Notes (Signed)
Patient discharged and rooming in with infant in room 334

## 2018-01-29 NOTE — Lactation Note (Signed)
This note was copied from a baby's chart. Lactation Consultation Note  Patient Name: Rebecca Rollins ZOXWR'UToday's Date: 01/29/2018 Reason for consult: Follow-up assessment   I have not seem Mom breastfeed today. Tor NettersSara M RN states she saw her breastfeed fairly well for a few minutes today. Based on what I saw yesterday with baby's latch and suckle on very large nipple with her very small mouth, I would not be surprised if it takes days or weeks for baby to "grow into" the ability to sustain the kind of latch and nutritive suckling to transfer milk well from the breast. I spoke with family and staff about focusing on baby feeding well (at this point it is with bottle) and working on milk supply (mom has increased pumping today and got hands free bra to help with that) and skin to skin. The breastfeeding part of the feeding will come with time, growth, and practice. Her milk volumes have increased well since yesterday (from 4 ml to 20+ml per feeding).  Mom has LC contact info and [facility] [employer] info. LC to F/U on feeding plan. BTW... Mom has only been using 30 mm flange for breast pump and that still seems to be correct size for her.     Maternal Data    Feeding Feeding Type: Formula Nipple Type: Slow - flow Length of feed: 30 min  LATCH Score                   Interventions Interventions: ([employer] app; increase pump/hand express til BF well)  Lactation Tools Discussed/Used Tools: Bottle WIC Program: Yes   Consult Status Consult Status: Follow-up Date: 01/30/18 Follow-up type: In-patient    Rebecca Rollins 01/29/2018, 5:02 PM

## 2018-01-29 NOTE — Discharge Summary (Signed)
                            Discharge Summary  Date of Admission: 01/27/2018  Date of Discharge: 01/29/2018  Admitting Diagnosis: Induction of labor at 10658w0d  Mode of Delivery: normal spontaneous vaginal delivery                 Discharge Diagnosis: No other diagnosis   Intrapartum Procedures: epidural, laceration 2nd and pitocin augmentation bilateral labial hemostatic not in need of repair. 2nd degree perineal    Post partum procedures: none  Complications: 2 degree perineal laceration                      Discharge Day SOAP Note:  Progress Note - Vaginal Delivery  Rebecca Rollins is a 27 y.o. G1P1001 now PP day 2 s/p Vaginal, Spontaneous . Delivery was uncomplicated  Subjective  The patient has the following complaints: has no unusual complaints  Pain is controlled with current medications.   Patient is urinating without difficulty.  She is ambulating well.    Objective  Vital signs: BP 121/79 (BP Location: Right Arm)   Pulse 97   Temp 98.4 F (36.9 C) (Oral)   Resp 17   Ht 5\' 2"  (1.575 m)   Wt 191 lb (86.6 kg)   LMP 05/13/2017   SpO2 99%   Breastfeeding? Unknown   BMI 34.93 kg/m   Physical Exam: Gen: NAD Fundus Fundal Tone: Firm  Lochia Amount: Small  Perineum Appearance: Approximated     Data Review Labs: CBC Latest Ref Rng & Units 01/28/2018 01/27/2018 11/29/2017  WBC 3.6 - 11.0 K/uL 9.2 7.3 7.0  Hemoglobin 12.0 - 16.0 g/dL 96.012.2 45.412.0 11.0(L)  Hematocrit 35.0 - 47.0 % 37.5 37.2 33.7(L)  Platelets 150 - 440 K/uL 228 262 300   O POS  Assessment/Plan  Active Problems:   Labor and delivery, indication for care  fetal growth restriction  Plan for discharge today.   Discharge Instructions: Per After Visit Summary. Activity: Advance as tolerated. Pelvic rest for 6 weeks.  Also refer to After Visit Summary Diet: Regular Medications: Allergies as of 01/29/2018      Reactions   Penicillins    REACTION: ? Rash REACTION: ? Rash      Medication  List    STOP taking these medications   ferrous sulfate 325 (65 FE) MG EC tablet     TAKE these medications   pantoprazole 40 MG tablet Commonly known as:  PROTONIX Take 1 tablet (40 mg total) by mouth as needed. What changed:    when to take this  reasons to take this   prenatal multivitamin Tabs tablet Take 1 tablet by mouth daily at 12 noon.   valACYclovir 1000 MG tablet Commonly known as:  VALTREX Take 1 tablet (1,000 mg total) by mouth as needed. What changed:    when to take this  reasons to take this      Outpatient follow up:  Postpartum contraception:She is unsure at this time. We will discuss at first office visit post-partum  Discharged Condition: good  Discharged to: home  Newborn Data: Disposition:special care   Apgars: APGAR (1 MIN): 7   APGAR (5 MINS): 8   APGAR (10 MINS):    Baby Feeding: Breast    Doreene Burkennie Macenzie Burford, CNM  01/29/2018 1:04 PM

## 2018-01-29 NOTE — Progress Notes (Signed)
Patient discharged home. Discharge instructions, prescriptions and follow up appointment given to and reviewed with patient. Patient verbalized understanding. Infant in SCN. Patient rooming in.

## 2018-01-29 NOTE — Final Progress Note (Signed)
Discharge Day SOAP Note:  Progress Note - Vaginal Delivery  Rebecca Rollins is a 27 y.o. G1P1001 now PP day 2 s/p Vaginal, Spontaneous . Delivery was uncomplicated  Subjective  The patient has the following complaints: has no unusual complaints  Pain is controlled with current medications.   Patient is urinating without difficulty.  She is ambulating well.    Objective  Vital signs: BP 121/79 (BP Location: Right Arm)   Pulse 97   Temp 98.4 F (36.9 C) (Oral)   Resp 17   Ht 5\' 2"  (1.575 m)   Wt 191 lb (86.6 kg)   LMP 05/13/2017   SpO2 99%   Breastfeeding? Unknown   BMI 34.93 kg/m   Physical Exam: Gen: NAD Fundus Fundal Tone: Firm  Lochia Amount: Small  Perineum Appearance: Approximated     Data Review Labs: CBC Latest Ref Rng & Units 01/28/2018 01/27/2018 11/29/2017  WBC 3.6 - 11.0 K/uL 9.2 7.3 7.0  Hemoglobin 12.0 - 16.0 g/dL 16.112.2 09.612.0 11.0(L)  Hematocrit 35.0 - 47.0 % 37.5 37.2 33.7(L)  Platelets 150 - 440 K/uL 228 262 300   O POS  Assessment/Plan  Active Problems:   Labor and delivery, indication for care  fetal growth restriction  Plan for discharge today.   Discharge Instructions: Per After Visit Summary. Activity: Advance as tolerated. Pelvic rest for 6 weeks.  Also refer to After Visit Summary Diet: Regular Medications: Allergies as of 01/29/2018      Reactions   Penicillins    REACTION: ? Rash REACTION: ? Rash      Medication List    STOP taking these medications   ferrous sulfate 325 (65 FE) MG EC tablet     TAKE these medications   pantoprazole 40 MG tablet Commonly known as:  PROTONIX Take 1 tablet (40 mg total) by mouth as needed. What changed:    when to take this  reasons to take this   prenatal multivitamin Tabs tablet Take 1 tablet by mouth daily at 12 noon.   valACYclovir 1000 MG tablet Commonly known as:  VALTREX Take 1 tablet (1,000 mg total) by mouth as needed. What changed:    when to take this  reasons to  take this      Outpatient follow up:  Postpartum contraception:She is unsure at this time. We will discuss at first office visit post-partum  Discharged Condition: good  Discharged to: home  Newborn Data: Disposition:special care   Apgars: APGAR (1 MIN): 7   APGAR (5 MINS): 8   APGAR (10 MINS):    Baby Feeding: Breast    Doreene Burkennie Lyndy Russman, CNM  01/29/2018 1:04 PM

## 2018-01-30 ENCOUNTER — Other Ambulatory Visit: Payer: Federal, State, Local not specified - PPO

## 2018-01-31 ENCOUNTER — Ambulatory Visit: Payer: Self-pay

## 2018-01-31 NOTE — Lactation Note (Signed)
This note was copied from a baby's chart. Lactation Consultation Note  Patient Name: Rebecca Rollins ZOXWR'UToday's Date: 01/31/2018 Reason for consult: Follow-up assessment;NICU baby   Maternal Data    Feeding Feeding Type: Breast Fed  LATCH Score Latch: Repeated attempts needed to sustain latch, nipple held in mouth throughout feeding, stimulation needed to elicit sucking reflex.  Audible Swallowing: None  Type of Nipple: Everted at rest and after stimulation  Comfort (Breast/Nipple): Soft / non-tender  Hold (Positioning): Full assist, staff holds infant at breast  LATCH Score: 5  Interventions Interventions: Breast feeding basics reviewed;Assisted with latch;Hand express;Breast massage;Support pillows;Position options;DEBP  Lactation Tools Discussed/Used Tools: Nipple Shields Nipple shield size: 24 Flange Size: 30   Consult Status Consult Status: Follow-up Date: 01/31/18 Follow-up type: In-patient  Baby very sleeping, latch achieved, but no sucking involved for milk transfer. LC implemented 24mm nipple shield to stimulate oral cavity because baby was actively suckling LC's finger, but not on mom's breast. Mom indicates that baby is more vigorous during evening/night feedings. Infant doesn't NEED nipple shield (mom has larger nipples). LC with f/u at 1500 feeding today.   Rebecca Rollins 01/31/2018, 12:33 PM

## 2018-02-03 LAB — MISC LABCORP TEST (SEND OUT): LABCORP TEST CODE: 188135

## 2018-03-10 ENCOUNTER — Encounter: Payer: Self-pay | Admitting: Certified Nurse Midwife

## 2018-03-10 ENCOUNTER — Ambulatory Visit (INDEPENDENT_AMBULATORY_CARE_PROVIDER_SITE_OTHER): Payer: Federal, State, Local not specified - PPO | Admitting: Certified Nurse Midwife

## 2018-03-10 NOTE — Progress Notes (Signed)
Pt is present to day for post partum visit PHQ-9 score: 8        Pt stated that she is pumping her breast for breast milk for the baby.  Pt stated that her nipples are sore, tender to touch and swollen at times. No other concerns. Pt stated that she started her period.

## 2018-03-10 NOTE — Progress Notes (Signed)
  Subjective:    Rebecca Rollins is a 27 y.o. G65P1001 African American female who presents for a postpartum visit. She is 6 weeks postpartum following a spontaneous vaginal delivery at 37 gestational weeks. Anesthesia: epidural. I have fully reviewed the prenatal and intrapartum course. Postpartum course has been WNL Baby's course has been wnl. Baby is feeding by pumping. Bleeding stopped , started her period . Bowel function is normal. Bladder function is normal. Patient is not sexually active. Last sexual activity: prior to delivery . Contraception method is none. Discussed methods is considering IUD.  Postpartum depression screening: negative. Score 8.  Last pap 05/2015 and was negative.  The following portions of the patient's history were reviewed and updated as appropriate: allergies, current medications, past medical history, past surgical history and problem list.  Review of Systems Pertinent items are noted in HPI.   Vitals:   03/10/18 1457  BP: 121/81  Pulse: 82  Weight: 176 lb 11.2 oz (80.2 kg)  Height:  (1.575 m)   No LMP recorded.  Objective:   General:  alert, cooperative and no distress   Breasts:  deferred, no complaints  Lungs: clear to auscultation bilaterally  Heart:  regular rate and rhythm  Abdomen: soft, nontender   Vulva: normal  Vagina: normal vagina  Cervix:  closed  Corpus: Well-involuted  Adnexa:  Non-palpable  Rectal Exam: no hemorrhoids        Assessment:   Postpartum exam 6 wks s/p SVD pumping Depression screening Contraception counseling   Plan:  : none Follow up in: a few days for IUD or earlier if needed. Annual exam/pap smear in 6 months   Doreene Burke, CNM

## 2018-03-10 NOTE — Patient Instructions (Signed)
Preventive Care 18-39 Years, Female Preventive care refers to lifestyle choices and visits with your health care provider that can promote health and wellness. What does preventive care include?  A yearly physical exam. This is also called an annual well check.  Dental exams once or twice a year.  Routine eye exams. Ask your health care provider how often you should have your eyes checked.  Personal lifestyle choices, including: ? Daily care of your teeth and gums. ? Regular physical activity. ? Eating a healthy diet. ? Avoiding tobacco and drug use. ? Limiting alcohol use. ? Practicing safe sex. ? Taking vitamin and mineral supplements as recommended by your health care provider. What happens during an annual well check? The services and screenings done by your health care provider during your annual well check will depend on your age, overall health, lifestyle risk factors, and family history of disease. Counseling Your health care provider may ask you questions about your:  Alcohol use.  Tobacco use.  Drug use.  Emotional well-being.  Home and relationship well-being.  Sexual activity.  Eating habits.  Work and work Statistician.  Method of birth control.  Menstrual cycle.  Pregnancy history.  Screening You may have the following tests or measurements:  Height, weight, and BMI.  Diabetes screening. This is done by checking your blood sugar (glucose) after you have not eaten for a while (fasting).  Blood pressure.  Lipid and cholesterol levels. These may be checked every 5 years starting at age 66.  Skin check.  Hepatitis C blood test.  Hepatitis B blood test.  Sexually transmitted disease (STD) testing.  BRCA-related cancer screening. This may be done if you have a family history of breast, ovarian, tubal, or peritoneal cancers.  Pelvic exam and Pap test. This may be done every 3 years starting at age 40. Starting at age 59, this may be done every 5  years if you have a Pap test in combination with an HPV test.  Discuss your test results, treatment options, and if necessary, the need for more tests with your health care provider. Vaccines Your health care provider may recommend certain vaccines, such as:  Influenza vaccine. This is recommended every year.  Tetanus, diphtheria, and acellular pertussis (Tdap, Td) vaccine. You may need a Td booster every 10 years.  Varicella vaccine. You may need this if you have not been vaccinated.  HPV vaccine. If you are 69 or younger, you may need three doses over 6 months.  Measles, mumps, and rubella (MMR) vaccine. You may need at least one dose of MMR. You may also need a second dose.  Pneumococcal 13-valent conjugate (PCV13) vaccine. You may need this if you have certain conditions and were not previously vaccinated.  Pneumococcal polysaccharide (PPSV23) vaccine. You may need one or two doses if you smoke cigarettes or if you have certain conditions.  Meningococcal vaccine. One dose is recommended if you are age 27-21 years and a first-year college student living in a residence hall, or if you have one of several medical conditions. You may also need additional booster doses.  Hepatitis A vaccine. You may need this if you have certain conditions or if you travel or work in places where you may be exposed to hepatitis A.  Hepatitis B vaccine. You may need this if you have certain conditions or if you travel or work in places where you may be exposed to hepatitis B.  Haemophilus influenzae type b (Hib) vaccine. You may need this if  you have certain risk factors.  Talk to your health care provider about which screenings and vaccines you need and how often you need them. This information is not intended to replace advice given to you by your health care provider. Make sure you discuss any questions you have with your health care provider. Document Released: 12/18/2001 Document Revised: 07/11/2016  Document Reviewed: 08/23/2015 Elsevier Interactive Patient Education  [employer].

## 2018-03-12 ENCOUNTER — Other Ambulatory Visit: Payer: Self-pay

## 2018-03-12 ENCOUNTER — Telehealth: Payer: Self-pay | Admitting: Certified Nurse Midwife

## 2018-03-12 DIAGNOSIS — Z3A09 9 weeks gestation of pregnancy: Secondary | ICD-10-CM

## 2018-03-12 MED ORDER — PRENATAL MULTIVITAMIN CH: 1.0000 | ORAL_TABLET | Freq: Every day | ORAL | 6 refills | Status: DC

## 2018-03-12 MED ORDER — PRENATAL LOW IRON 27-1 MG PO TABS: 27.0000 mg | ORAL_TABLET | Freq: Every day | ORAL | 6 refills | Status: DC

## 2018-03-12 NOTE — Telephone Encounter (Signed)
The patient called and stated that she needs her prenatal vitamins refilled and sent to her pharmacy. Please advise.

## 2018-03-26 ENCOUNTER — Ambulatory Visit (INDEPENDENT_AMBULATORY_CARE_PROVIDER_SITE_OTHER): Payer: Federal, State, Local not specified - PPO | Admitting: Certified Nurse Midwife

## 2018-03-26 VITALS — BP 116/70 | HR 80 | Ht 62.0 in | Wt 175.2 lb

## 2018-03-26 DIAGNOSIS — Z30430 Encounter for insertion of intrauterine contraceptive device: Secondary | ICD-10-CM | POA: Diagnosis not present

## 2018-03-26 LAB — POCT URINE PREGNANCY: Preg Test, Ur: NEGATIVE

## 2018-03-26 NOTE — Progress Notes (Signed)
Pt is here for a Mirena insert. Refused ibuprofen. Informed consent signed.

## 2018-03-26 NOTE — Progress Notes (Signed)
    GYNECOLOGY OFFICE PROCEDURE NOTE  Rebecca Rollins is a 27 y.o. G1P1001 here for MirenaIUD insertion. No GYN concerns.   No contraindications to placement of IUD.Last pap smear was on 05/16/18 and was normal. Risks and benefits thoroughly reviewed with patient. Patient is aware of possible expulsion and perforation of the IUD. The patient is aware of irregular bleeding due to the method and understands the incidence of irregular bleeding diminishes with time.  Signed copy of informed consent in chart.    UPT negative today.   IUD Insertion Procedure Note Patient identified, informed consent performed, consent signed.   Discussed risks of irregular bleeding, cramping, infection, malpositioning or misplacement of the IUD outside the uterus which may require further procedure such as laparoscopy. Time out was performed.  Urine pregnancy test negative.  Speculum placed in the vagina.  Cervix visualized.  Cleaned with Betadine x 2.  Grasped anteriorly with a single tooth tenaculum.  Uterus sounded to 7 cm.  Mirena IUD placed per manufacturer's recommendations.  Strings trimmed to 3 cm. Tenaculum was removed, small amount of bleeding good hemostasis noted.  Patient tolerated procedure well.   Patient was given post-procedure instructions.  She was advised to have backup contraception for one week.  Patient was also asked to check IUD strings periodically and follow up in 4 weeks for IUD check. Follow up in September for annual exam with pap smear.   She was given a Mirena care card with date Mirena placed, and date Mirena to be removed.  Reviewed red flag symptoms and when to call. RTC x 4-6 weeks for IUD string check or sooner if needed.      Rebecca Rollins,SNM/Rebecca Rollins,CNM

## 2018-03-26 NOTE — Patient Instructions (Signed)

## 2018-04-28 ENCOUNTER — Ambulatory Visit (INDEPENDENT_AMBULATORY_CARE_PROVIDER_SITE_OTHER): Payer: Federal, State, Local not specified - PPO | Admitting: Certified Nurse Midwife

## 2018-04-28 VITALS — BP 104/82 | HR 98 | Ht 62.0 in | Wt 175.1 lb

## 2018-04-28 DIAGNOSIS — Z30431 Encounter for routine checking of intrauterine contraceptive device: Secondary | ICD-10-CM

## 2018-04-28 NOTE — Patient Instructions (Signed)

## 2018-04-28 NOTE — Progress Notes (Signed)
  GYNECOLOGY OFFICE PROGRESS NOTE  History:  27 y.o. G1P1001 here today for today for IUD string check; mirena IUD was placed  03/26/18. NShe complains of vaginal bleeding without cramping. She states that it was light , then she believes she had a regular period and it has remained heavy.   The following portions of the patient's history were reviewed and updated as appropriate: allergies, current medications, past family history, past medical history, past social history, past surgical history and problem list. Last pap smear on 7/16 was normal.   Review of Systems:  Pertinent items are noted in HPI.   Objective:  Physical Exam currently breastfeeding. CONSTITUTIONAL: Well-developed, well-nourished female in no acute distress.  HENT:  Normocephalic, atraumatic. External right and left ear normal. Oropharynx is clear and moist EYES: Conjunctivae and EOM are normal. Pupils are equal, round, and reactive to light. No scleral icterus.  NECK: Normal range of motion, supple, no masses CARDIOVASCULAR: Normal heart rate noted RESPIRATORY: Effort and breath sounds normal, no problems with respiration noted ABDOMEN: Soft, no distention noted.   PELVIC: Normal appearing external genitalia; normal appearing vaginal mucosa and cervix.  IUD strings visualized, about 3 cm in length outside cervix.   Assessment & Plan:  Normal IUD check. Patient to keep IUD in place for five years; can come in for removal if she desires pregnancy within the next five years. Encouraged use of Ibuprofen to help with bleeding. Pt verbalizes and agrees to plan.  Routine preventative health maintenance measures emphasized.  Doreene BurkeAnnie Kimeka Badour, CNM  Doreene BurkeAnnie Catrena Vari, CNM

## 2018-04-28 NOTE — Progress Notes (Signed)
Pt is here for an IUD string check. States she has been bleeding heavily since it was inserted.

## 2018-09-11 ENCOUNTER — Encounter: Payer: Self-pay | Admitting: Family Medicine

## 2018-09-11 ENCOUNTER — Ambulatory Visit: Payer: Federal, State, Local not specified - PPO | Admitting: Family Medicine

## 2018-09-11 VITALS — BP 104/72 | HR 88 | Temp 98.4°F | Ht 62.0 in | Wt 188.8 lb

## 2018-09-11 DIAGNOSIS — R05 Cough: Secondary | ICD-10-CM | POA: Diagnosis not present

## 2018-09-11 DIAGNOSIS — J029 Acute pharyngitis, unspecified: Secondary | ICD-10-CM

## 2018-09-11 DIAGNOSIS — R059 Cough, unspecified: Secondary | ICD-10-CM

## 2018-09-11 LAB — POC INFLUENZA A&B (BINAX/QUICKVUE)
INFLUENZA A, POC: NEGATIVE
INFLUENZA B, POC: NEGATIVE

## 2018-09-11 LAB — POCT RAPID STREP A (OFFICE): RAPID STREP A SCREEN: NEGATIVE

## 2018-09-11 NOTE — Progress Notes (Signed)
Sx started yesterday.  Felt fatigued, quick onset.  Then some aches.  Had chills.  Felt worse last night.  No vomiting, no diarrhea.  Some cough.  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  Felt hot last night, but no documented fever.  ST this AM.    Has a 49 month old daughter.   Not breast feeding.    Meds, vitals, and allergies reviewed.   ROS: Per HPI unless specifically indicated in ROS section   GEN: nad, alert and oriented HEENT: mucous membranes moist, tm w/o erythema, nasal exam w/o erythema, clear discharge noted,  OP with cobblestoning NECK: supple w/o LA CV: rrr.   PULM: ctab, no inc wob EXT: no edema SKIN: no acute rash

## 2018-09-11 NOTE — Patient Instructions (Addendum)
Presumed non-flu viral illness.   Hand washing, cover your cough.  Rest and fluids.  Salt water gargles.   Tylenol if needed.  If needed, use ibuprofen with food.  Take care.  Glad to see you.  Update [place] as needed.    Flu shot when possible.

## 2018-09-12 ENCOUNTER — Encounter: Payer: Self-pay | Admitting: Certified Nurse Midwife

## 2018-09-12 ENCOUNTER — Ambulatory Visit (INDEPENDENT_AMBULATORY_CARE_PROVIDER_SITE_OTHER): Payer: Federal, State, Local not specified - PPO | Admitting: Certified Nurse Midwife

## 2018-09-12 ENCOUNTER — Other Ambulatory Visit (HOSPITAL_COMMUNITY)
Admission: RE | Admit: 2018-09-12 | Discharge: 2018-09-12 | Disposition: A | Discharge status: Home / Self Care | Payer: Federal, State, Local not specified - PPO | Source: Ambulatory Visit | Attending: Certified Nurse Midwife | Admitting: Certified Nurse Midwife

## 2018-09-12 VITALS — BP 100/68 | HR 86 | Ht 62.0 in | Wt 188.6 lb

## 2018-09-12 DIAGNOSIS — Z01419 Encounter for gynecological examination (general) (routine) without abnormal findings: Secondary | ICD-10-CM

## 2018-09-12 DIAGNOSIS — Z23 Encounter for immunization: Secondary | ICD-10-CM | POA: Diagnosis not present

## 2018-09-12 NOTE — Progress Notes (Signed)
GYNECOLOGY ANNUAL PREVENTATIVE CARE ENCOUNTER NOTE  Subjective:   Rebecca Rollins is a 27 y.o. G76P1001 female here for a routine annual gynecologic exam.  Current complaints: intermittent bleeding with IUD.  Denies abdominal/pelvic pain.  Denies pelvic pain, problems with intercourse or other gynecologic concerns.    Gynecologic History No LMP recorded. (Menstrual status: IUD). Contraception: IUD Last Pap: 7/12/2016Results were: normal  Last mammogram: n/a  Obstetric History OB History  Gravida Para Term Preterm AB Living  1 1 1  0 0 1  SAB TAB Ectopic Multiple Live Births  0 0 0 0 1    # Outcome Date GA Lbr Len/2nd Weight Sex Delivery Anes PTL Lv  1 Term 01/27/18 [redacted]w[redacted]d 06:35 / 00:11 4 lb 4.4 oz (1.94 kg) F Vag-Spont EPI  LIV    Past Medical History:  Diagnosis Date  . Reflux     Past Surgical History:  Procedure Laterality Date  . MOUTH SURGERY  2011    Current Outpatient Medications on File Prior to Visit  Medication Sig Dispense Refill  . ranitidine (ZANTAC) 150 MG tablet Take 150 mg by mouth 2 (two) times daily.    . valACYclovir (VALTREX) 1000 MG tablet Take 1 tablet (1,000 mg total) by mouth as needed. 30 tablet 3   No current facility-administered medications on file prior to visit.     Allergies  Allergen Reactions  . Penicillins     REACTION: ? Rash    Social History:  reports that she has never smoked. She has never used smokeless tobacco. She reports that she drinks alcohol. She reports that she does not use drugs.  Family History  Problem Relation Age of Onset  . Hypertension Mother   . Anemia Mother   . Diabetes Maternal Uncle   . Hypertension Maternal Grandmother   . Hypertension Maternal Grandfather     The following portions of the patient's history were reviewed and updated as appropriate: allergies, current medications, past family history, past medical history, past social history, past surgical history and problem list.  Review of  Systems Pertinent items noted in HPI and remainder of comprehensive ROS otherwise negative.   Objective:  There were no vitals taken for this visit. CONSTITUTIONAL: Well-developed, well-nourished female in no acute distress.  HENT:  Normocephalic, atraumatic, External right and left ear normal. Oropharynx is clear and moist EYES: Conjunctivae and EOM are normal. Pupils are equal, round, and reactive to light. No scleral icterus.  NECK: Normal range of motion, supple, no masses.  Normal thyroid.  SKIN: Skin is warm and dry. No rash noted. Not diaphoretic. No erythema. No pallor. MUSCULOSKELETAL: Normal range of motion. No tenderness.  No cyanosis, clubbing, or edema.  2+ distal pulses. NEUROLOGIC: Alert and oriented to person, place, and time. Normal reflexes, muscle tone coordination. No cranial nerve deficit noted. PSYCHIATRIC: Normal mood and affect. Normal behavior. Normal judgment and thought content. CARDIOVASCULAR: Normal heart rate noted, regular rhythm RESPIRATORY: Clear to auscultation bilaterally. Effort and breath sounds normal, no problems with respiration noted. BREASTS: Symmetric in size. No masses, skin changes, nipple drainage, or lymphadenopathy. ABDOMEN: Soft, normal bowel sounds, no distention noted.  No tenderness, rebound or guarding.  PELVIC: Normal appearing external genitalia; normal appearing vaginal mucosa and cervix.  No abnormal discharge noted.  Blood present. Strings visualized Pap smear obtained.  Normal uterine size, no other palpable masses, no uterine or adnexal tenderness.  Assessment and Plan:  1. Women's annual routine gynecological examination Will follow up results of pap  smear and manage accordingly. Mammogram not indicated Routine preventative health maintenance measures emphasized. Please refer to After Visit Summary for other counseling recommendations.   Doreene Burke, CNM

## 2018-09-12 NOTE — Addendum Note (Signed)
Addended by: Brooke Dare on: 09/12/2018 10:04 AM   Modules accepted: Orders

## 2018-09-12 NOTE — Patient Instructions (Signed)
Preventive Care 18-39 Years, Female Preventive care refers to lifestyle choices and visits with your health care provider that can promote health and wellness. What does preventive care include?  A yearly physical exam. This is also called an annual well check.  Dental exams once or twice a year.  Routine eye exams. Ask your health care provider how often you should have your eyes checked.  Personal lifestyle choices, including: ? Daily care of your teeth and gums. ? Regular physical activity. ? Eating a healthy diet. ? Avoiding tobacco and drug use. ? Limiting alcohol use. ? Practicing safe sex. ? Taking vitamin and mineral supplements as recommended by your health care provider. What happens during an annual well check? The services and screenings done by your health care provider during your annual well check will depend on your age, overall health, lifestyle risk factors, and family history of disease. Counseling Your health care provider may ask you questions about your:  Alcohol use.  Tobacco use.  Drug use.  Emotional well-being.  Home and relationship well-being.  Sexual activity.  Eating habits.  Work and work Statistician.  Method of birth control.  Menstrual cycle.  Pregnancy history.  Screening You may have the following tests or measurements:  Height, weight, and BMI.  Diabetes screening. This is done by checking your blood sugar (glucose) after you have not eaten for a while (fasting).  Blood pressure.  Lipid and cholesterol levels. These may be checked every 5 years starting at age 66.  Skin check.  Hepatitis C blood test.  Hepatitis B blood test.  Sexually transmitted disease (STD) testing.  BRCA-related cancer screening. This may be done if you have a family history of breast, ovarian, tubal, or peritoneal cancers.  Pelvic exam and Pap test. This may be done every 3 years starting at age 40. Starting at age 59, this may be done every 5  years if you have a Pap test in combination with an HPV test.  Discuss your test results, treatment options, and if necessary, the need for more tests with your health care provider. Vaccines Your health care provider may recommend certain vaccines, such as:  Influenza vaccine. This is recommended every year.  Tetanus, diphtheria, and acellular pertussis (Tdap, Td) vaccine. You may need a Td booster every 10 years.  Varicella vaccine. You may need this if you have not been vaccinated.  HPV vaccine. If you are 69 or younger, you may need three doses over 6 months.  Measles, mumps, and rubella (MMR) vaccine. You may need at least one dose of MMR. You may also need a second dose.  Pneumococcal 13-valent conjugate (PCV13) vaccine. You may need this if you have certain conditions and were not previously vaccinated.  Pneumococcal polysaccharide (PPSV23) vaccine. You may need one or two doses if you smoke cigarettes or if you have certain conditions.  Meningococcal vaccine. One dose is recommended if you are age 27-21 years and a first-year college student living in a residence hall, or if you have one of several medical conditions. You may also need additional booster doses.  Hepatitis A vaccine. You may need this if you have certain conditions or if you travel or work in places where you may be exposed to hepatitis A.  Hepatitis B vaccine. You may need this if you have certain conditions or if you travel or work in places where you may be exposed to hepatitis B.  Haemophilus influenzae type b (Hib) vaccine. You may need this if  you have certain risk factors.  Talk to your health care provider about which screenings and vaccines you need and how often you need them. This information is not intended to replace advice given to you by your health care provider. Make sure you discuss any questions you have with your health care provider. Document Released: 12/18/2001 Document Revised: 07/11/2016  Document Reviewed: 08/23/2015 Elsevier Interactive Patient Education  [employer].

## 2018-09-14 DIAGNOSIS — J029 Acute pharyngitis, unspecified: Secondary | ICD-10-CM | POA: Insufficient documentation

## 2018-09-14 NOTE — Assessment & Plan Note (Signed)
ddx d/w pt.  Presumed non-flu viral illness.   Discussed hand washing, cover cough.  Rest and fluids.  Salt water gargles.   Tylenol if needed.  If needed, use ibuprofen with food.  Update [place] as needed.   She agrees with plan.  Nontoxic.  Okay for outpatient follow-up.

## 2018-09-15 LAB — CYTOLOGY - PAP: DIAGNOSIS: NEGATIVE

## 2018-10-06 ENCOUNTER — Telehealth: Payer: Self-pay | Admitting: Certified Nurse Midwife

## 2018-10-06 NOTE — Telephone Encounter (Signed)
The patient would like to speak with a nurse in regards to her having the Mirena and she has been bleeding excessively. She is filling up 1-2 Orange thick pads in an hour. Please advise.

## 2018-10-07 ENCOUNTER — Other Ambulatory Visit: Payer: Self-pay | Admitting: Certified Nurse Midwife

## 2018-10-07 DIAGNOSIS — N921 Excessive and frequent menstruation with irregular cycle: Secondary | ICD-10-CM

## 2018-10-07 DIAGNOSIS — Z975 Presence of (intrauterine) contraceptive device: Principal | ICD-10-CM

## 2018-10-07 NOTE — Progress Notes (Signed)
Pt complains of heavy bleeding with IUD. Ultrasound ordered to check for placement.   Doreene BurkeAnnie Yoshie Kosel, CNM

## 2018-10-07 NOTE — Telephone Encounter (Signed)
My chart message sent to pt request she come in for ultrasound to evaluate for IUD placement and discuss medication options.   Rebecca BurkeAnnie Rease Swinson, CNM

## 2018-10-14 ENCOUNTER — Other Ambulatory Visit: Payer: Federal, State, Local not specified - PPO

## 2018-12-09 ENCOUNTER — Other Ambulatory Visit: Payer: Federal, State, Local not specified - PPO | Admitting: Certified Nurse Midwife

## 2019-08-05 ENCOUNTER — Encounter: Payer: Self-pay | Admitting: Certified Nurse Midwife

## 2019-08-05 ENCOUNTER — Ambulatory Visit: Payer: Self-pay | Admitting: Certified Nurse Midwife

## 2019-08-05 ENCOUNTER — Other Ambulatory Visit: Payer: Self-pay

## 2019-08-05 VITALS — BP 112/68 | HR 82 | Ht 62.0 in | Wt 170.1 lb

## 2019-08-05 DIAGNOSIS — R238 Other skin changes: Secondary | ICD-10-CM

## 2019-08-05 DIAGNOSIS — R233 Spontaneous ecchymoses: Secondary | ICD-10-CM

## 2019-08-05 DIAGNOSIS — Z975 Presence of (intrauterine) contraceptive device: Secondary | ICD-10-CM

## 2019-08-05 DIAGNOSIS — N926 Irregular menstruation, unspecified: Secondary | ICD-10-CM

## 2019-08-05 MED ORDER — VALACYCLOVIR HCL 1 G PO TABS: 1000.0000 mg | ORAL_TABLET | ORAL | 3 refills | Status: DC | PRN

## 2019-08-05 NOTE — Patient Instructions (Signed)

## 2019-08-05 NOTE — Progress Notes (Signed)
  GYNECOLOGY OFFICE ENCOUNTER NOTE  History:  28 y.o. G1P1001 here today for today for IUD string check; Mirena  IUD was placed  04/28/2018. She state she has been having brusing and numbing of her exertrimites for several months and her mother has been worried that it is being caused by the IUD. She also continues to have episodes of heavy bleeding. .  The following portions of the patient's history were reviewed and updated as appropriate: allergies, current medications, past family history, past medical history, past social history, past surgical history and problem list. Last pap smear on 2016 was normal, negative.  Review of Systems:  Pertinent items are noted in HPI.   Objective:  Physical Exam Blood pressure 112/68, pulse 82, height 5\' 2"  (1.575 m), weight 170 lb 1 oz (77.1 kg), last menstrual period 07/10/2019, currently breastfeeding. CONSTITUTIONAL: Well-developed, well-nourished female in no acute distress.  HENT:  Normocephalic, atraumatic. External right and left ear normal. Oropharynx is clear and moist EYES: Conjunctivae and EOM are normal. Pupils are equal, round, and reactive to light. No scleral icterus.  NECK: Normal range of motion, supple, no masses CARDIOVASCULAR: Normal heart rate noted RESPIRATORY: Effort and breath sounds normal, no problems with respiration noted ABDOMEN: Soft, no distention noted.   PELVIC: Normal appearing external genitalia; normal appearing vaginal mucosa and cervix.  IUD strings visualized, about 3 cm in length outside cervix. ON bimanual exam I felt like I may have been feeling the tip of the IUD.   Assessment & Plan:  Pt encouraged to have u/s to check IUD placement. Discussed removal if IUD is to low. Booklet given on [facility] options. If we need to remove IUD would she like to consider replacement or alternative options. She is to follow up for U/a and AE as soon as able. Recommend follow up with PCP in regards to numbness and bruising. She  verbalizes and agrees to plan.   Philip Aspen, CNM

## 2019-08-18 ENCOUNTER — Other Ambulatory Visit: Payer: Self-pay

## 2019-08-18 ENCOUNTER — Encounter: Payer: Self-pay | Admitting: Certified Nurse Midwife

## 2019-08-18 ENCOUNTER — Ambulatory Visit (INDEPENDENT_AMBULATORY_CARE_PROVIDER_SITE_OTHER): Payer: Self-pay

## 2019-08-18 ENCOUNTER — Other Ambulatory Visit: Payer: Self-pay | Admitting: Certified Nurse Midwife

## 2019-08-18 ENCOUNTER — Ambulatory Visit: Payer: Self-pay | Admitting: Certified Nurse Midwife

## 2019-08-18 VITALS — BP 121/67 | HR 86 | Ht 62.0 in | Wt 169.3 lb

## 2019-08-18 DIAGNOSIS — Z01419 Encounter for gynecological examination (general) (routine) without abnormal findings: Secondary | ICD-10-CM

## 2019-08-18 DIAGNOSIS — Z30431 Encounter for routine checking of intrauterine contraceptive device: Secondary | ICD-10-CM

## 2019-08-18 DIAGNOSIS — N83201 Unspecified ovarian cyst, right side: Secondary | ICD-10-CM

## 2019-08-18 NOTE — Progress Notes (Signed)
GYNECOLOGY ANNUAL PREVENTATIVE CARE ENCOUNTER NOTE  History:     MARCO ADELSON is a 28 y.o. G47P1001 female here for a routine annual gynecologic exam.  Current complaints: none, has been worried about IUD placement. Unable to feel strings.    Denies abnormal vaginal bleeding, discharge, pelvic pain, problems with intercourse or other gynecologic concerns.    Gynecologic History No LMP recorded. (Menstrual status: IUD). Contraception: IUD Last Pap: 09/22/2018. Results were: normal  Last mammogram: n/a .   Obstetric History OB History  Gravida Para Term Preterm AB Living  1 1 1  0 0 1  SAB TAB Ectopic Multiple Live Births  0 0 0 0 1    # Outcome Date GA Lbr Len/2nd Weight Sex Delivery Anes PTL Lv  1 Term 01/27/18 [redacted]w[redacted]d 06:35 / 00:11 4 lb 4.4 oz (1.94 kg) F Vag-Spont EPI  LIV    Past Medical History:  Diagnosis Date  . Reflux     Past Surgical History:  Procedure Laterality Date  . MOUTH SURGERY  2011    Current Outpatient Medications on File Prior to Visit  Medication Sig Dispense Refill  . levonorgestrel (MIRENA) 20 MCG/24HR IUD 1 each by Intrauterine route once.    . ranitidine (ZANTAC) 150 MG tablet Take 150 mg by mouth 2 (two) times daily.    . valACYclovir (VALTREX) 1000 MG tablet Take 1 tablet (1,000 mg total) by mouth as needed. 30 tablet 3   No current facility-administered medications on file prior to visit.     Allergies  Allergen Reactions  . Penicillins     REACTION: ? Rash    Social History:  reports that she has never smoked. She has never used smokeless tobacco. She reports current alcohol use. She reports that she does not use drugs.  Family History  Problem Relation Age of Onset  . Hypertension Mother   . Anemia Mother   . Diabetes Maternal Uncle   . Hypertension Maternal Grandmother   . Hypertension Maternal Grandfather     The following portions of the patient's history were reviewed and updated as appropriate: allergies, current  medications, past family history, past medical history, past social history, past surgical history and problem list.  Review of Systems Pertinent items noted in HPI and remainder of comprehensive ROS otherwise negative.  Physical Exam:  There were no vitals taken for this visit. CONSTITUTIONAL: Well-developed, well-nourished female in no acute distress.  HENT:  Normocephalic, atraumatic, External right and left ear normal. Oropharynx is clear and moist EYES: Conjunctivae and EOM are normal. Pupils are equal, round, and reactive to light. No scleral icterus.  NECK: Normal range of motion, supple, no masses.  Normal thyroid.  SKIN: Skin is warm and dry. No rash noted. Not diaphoretic. No erythema. No pallor. MUSCULOSKELETAL: Normal range of motion. No tenderness.  No cyanosis, clubbing, or edema.  2+ distal pulses. NEUROLOGIC: Alert and oriented to person, place, and time. Normal reflexes, muscle tone coordination. No cranial nerve deficit noted. PSYCHIATRIC: Normal mood and affect. Normal behavior. Normal judgment and thought content. CARDIOVASCULAR: Normal heart rate noted, regular rhythm RESPIRATORY: Clear to auscultation bilaterally. Effort and breath sounds normal, no problems with respiration noted. BREASTS: Symmetric in size. No masses, skin changes, nipple drainage, or lymphadenopathy. ABDOMEN: Soft, normal bowel sounds, no distention noted.  No tenderness, rebound or guarding.  PELVIC: Normal appearing external genitalia; normal appearing vaginal mucosa and cervix.  No abnormal discharge noted.  Pap smear obtained.  Normal uterine size, no other  palpable masses, no uterine or adnexal tenderness.     Patient Name: ADAMARIZ GILLOTT DOB: [birth year] MRN: [id number] ULTRASOUND REPORT  Location: [facility]  Date of Service: 08/18/2019    TECHNIQUE: Both transabdominal and transvaginal ultrasound examinations of the pelvis were performed. Transabdominal technique was performed  for global imaging of the pelvis including uterus, ovaries, adnexal regions, and pelvic cul-de-sac. It was necessary to proceed with endovaginal exam following the transabdominal exam to visualize the endometrium and adnexa.  Indications:IUD check Findings:  The uterus is anteverted and measures 8.1 x 3.8 x 4.4 cm. Echo texture is homogenous without evidence of focal masses.  The Endometrium measures 10 mm. IUD was cental located with- in the endometrium.  Right Ovary measures 2.3 x 2.4 x 3.6 cm. It is normal in appearance.Simple hypoechoic lesion measuring 2.1 x 1.8 x 1.8 cm. No color flow Doppler with in lesion.  Left Ovary measures 2.9 x 1.5 x 2.0 cm. It is normal in appearance.  No adnexal masses seen. There is no free fluid in the cul de sac.  Impression: 1. Rt ovarian simple cyst as described above. 2. IUD was  proper located with-in the endometrium.  Recommendations: 1.Clinical correlation with the patient's History and Physical Exam.   Jenine M. Alessi    RDMS Assessment and Plan:  Annual Well Women GYN exam  Pap not due until 2022 Not indicated -Mammogram  Labs: declines at this time IUD in correct placed. Discussed ovarain cyst follow up if having pelvic pain .  Routine preventative health maintenance measures emphasized. Please refer to After Visit Summary for other counseling recommendations.      Doreene Burke, CNM

## 2019-08-18 NOTE — Patient Instructions (Signed)
Preventive Care 21-26 Years Old, Female Preventive care refers to visits with your health care provider and lifestyle choices that can promote health and wellness. This includes:  A yearly physical exam. This may also be called an annual well check.  Regular dental visits and eye exams.  Immunizations.  Screening for certain conditions.  Healthy lifestyle choices, such as eating a healthy diet, getting regular exercise, not using drugs or products that contain nicotine and tobacco, and limiting alcohol use. What can I expect for my preventive care visit? Physical exam Your health care provider will check your:  Height and weight. This may be used to calculate body mass index (BMI), which tells if you are at a healthy weight.  Heart rate and blood pressure.  Skin for abnormal spots. Counseling Your health care provider may ask you questions about your:  Alcohol, tobacco, and drug use.  Emotional well-being.  Home and relationship well-being.  Sexual activity.  Eating habits.  Work and work environment.  Method of birth control.  Menstrual cycle.  Pregnancy history. What immunizations do I need?  Influenza (flu) vaccine  This is recommended every year. Tetanus, diphtheria, and pertussis (Tdap) vaccine  You may need a Td booster every 10 years. Varicella (chickenpox) vaccine  You may need this if you have not been vaccinated. Human papillomavirus (HPV) vaccine  If recommended by your health care provider, you may need three doses over 6 months. Measles, mumps, and rubella (MMR) vaccine  You may need at least one dose of MMR. You may also need a second dose. Meningococcal conjugate (MenACWY) vaccine  One dose is recommended if you are age 19-21 years and a first-year college student living in a residence hall, or if you have one of several medical conditions. You may also need additional booster doses. Pneumococcal conjugate (PCV13) vaccine  You may need  this if you have certain conditions and were not previously vaccinated. Pneumococcal polysaccharide (PPSV23) vaccine  You may need one or two doses if you smoke cigarettes or if you have certain conditions. Hepatitis A vaccine  You may need this if you have certain conditions or if you travel or work in places where you may be exposed to hepatitis A. Hepatitis B vaccine  You may need this if you have certain conditions or if you travel or work in places where you may be exposed to hepatitis B. Haemophilus influenzae type b (Hib) vaccine  You may need this if you have certain conditions. You may receive vaccines as individual doses or as more than one vaccine together in one shot (combination vaccines). Talk with your health care provider about the risks and benefits of combination vaccines. What tests do I need?  Blood tests  Lipid and cholesterol levels. These may be checked every 5 years starting at age 20.  Hepatitis C test.  Hepatitis B test. Screening  Diabetes screening. This is done by checking your blood sugar (glucose) after you have not eaten for a while (fasting).  Sexually transmitted disease (STD) testing.  BRCA-related cancer screening. This may be done if you have a family history of breast, ovarian, tubal, or peritoneal cancers.  Pelvic exam and Pap test. This may be done every 3 years starting at age 21. Starting at age 30, this may be done every 5 years if you have a Pap test in combination with an HPV test. Talk with your health care provider about your test results, treatment options, and if necessary, the need for more tests.   Follow these instructions at home: Eating and drinking   Eat a diet that includes fresh fruits and vegetables, whole grains, lean protein, and low-fat dairy.  Take vitamin and mineral supplements as recommended by your health care provider.  Do not drink alcohol if: ? Your health care provider tells you not to drink. ? You are  pregnant, may be pregnant, or are planning to become pregnant.  If you drink alcohol: ? Limit how much you have to 0-1 drink a day. ? Be aware of how much alcohol is in your drink. In the [place] one drink equals one 12 oz bottle of beer (355 mL), one 5 oz glass of wine (148 mL), or one 1 oz glass of hard liquor (44 mL). Lifestyle  Take daily care of your teeth and gums.  Stay active. Exercise for at least 30 minutes on 5 or more days each week.  Do not use any products that contain nicotine or tobacco, such as cigarettes, e-cigarettes, and chewing tobacco. If you need help quitting, ask your health care provider.  If you are sexually active, practice safe sex. Use a condom or other form of birth control (contraception) in order to prevent pregnancy and STIs (sexually transmitted infections). If you plan to become pregnant, see your health care provider for a preconception visit. What's next?  Visit your health care provider once a year for a well check visit.  Ask your health care provider how often you should have your eyes and teeth checked.  Stay up to date on all vaccines. This information is not intended to replace advice given to you by your health care provider. Make sure you discuss any questions you have with your health care provider. Document Released: 12/18/2001 Document Revised: 07/03/2018 Document Reviewed: 07/03/2018 Elsevier Patient Education  2020 [employer].

## 2019-09-14 ENCOUNTER — Encounter: Payer: Federal, State, Local not specified - PPO | Admitting: Certified Nurse Midwife

## 2019-12-04 ENCOUNTER — Ambulatory Visit: Payer: BC Managed Care – PPO | Admitting: Family Medicine

## 2019-12-04 ENCOUNTER — Telehealth: Payer: Self-pay

## 2019-12-04 ENCOUNTER — Other Ambulatory Visit: Payer: Self-pay

## 2019-12-04 ENCOUNTER — Encounter: Payer: Self-pay | Admitting: Family Medicine

## 2019-12-04 VITALS — BP 92/60 | HR 85 | Temp 98.0°F | Ht 62.0 in | Wt 160.5 lb

## 2019-12-04 DIAGNOSIS — N644 Mastodynia: Secondary | ICD-10-CM

## 2019-12-04 DIAGNOSIS — R1013 Epigastric pain: Secondary | ICD-10-CM

## 2019-12-04 DIAGNOSIS — F321 Major depressive disorder, single episode, moderate: Secondary | ICD-10-CM | POA: Insufficient documentation

## 2019-12-04 DIAGNOSIS — F411 Generalized anxiety disorder: Secondary | ICD-10-CM

## 2019-12-04 DIAGNOSIS — R0789 Other chest pain: Secondary | ICD-10-CM | POA: Diagnosis not present

## 2019-12-04 LAB — COMPREHENSIVE METABOLIC PANEL
ALT: 7 U/L (ref 0–35)
AST: 15 U/L (ref 0–37)
Albumin: 4.3 g/dL (ref 3.5–5.2)
Alkaline Phosphatase: 77 U/L (ref 39–117)
BUN: 3 mg/dL — ABNORMAL LOW (ref 6–23)
CO2: 27 mEq/L (ref 19–32)
Calcium: 9.7 mg/dL (ref 8.4–10.5)
Chloride: 105 mEq/L (ref 96–112)
Creatinine, Ser: 0.79 mg/dL (ref 0.40–1.20)
GFR: 105.61 mL/min (ref >60.00)
Glucose, Bld: 83 mg/dL (ref 70–99)
Potassium: 4.4 mEq/L (ref 3.5–5.1)
Sodium: 138 mEq/L (ref 135–145)
Total Bilirubin: 0.3 mg/dL (ref 0.2–1.2)
Total Protein: 7.8 g/dL (ref 6.0–8.3)

## 2019-12-04 LAB — CBC WITH DIFFERENTIAL/PLATELET
Basophils Absolute: 0 10*3/uL (ref 0.0–0.1)
Basophils Relative: 0.5 % (ref 0.0–3.0)
Eosinophils Absolute: 0.1 10*3/uL (ref 0.0–0.7)
Eosinophils Relative: 2.1 % (ref 0.0–5.0)
HCT: 35.8 % — ABNORMAL LOW (ref 36.0–46.0)
Hemoglobin: 11.5 g/dL — ABNORMAL LOW (ref 12.0–15.0)
Lymphocytes Relative: 46.6 % — ABNORMAL HIGH (ref 12.0–46.0)
Lymphs Abs: 1.8 10*3/uL (ref 0.7–4.0)
MCHC: 32.1 g/dL (ref 30.0–36.0)
MCV: 83.3 fl (ref 78.0–100.0)
Monocytes Absolute: 0.3 10*3/uL (ref 0.1–1.0)
Monocytes Relative: 8.6 % (ref 3.0–12.0)
Neutro Abs: 1.7 10*3/uL (ref 1.4–7.7)
Neutrophils Relative %: 42.2 % — ABNORMAL LOW (ref 43.0–77.0)
Platelets: 322 10*3/uL (ref 150.0–400.0)
RBC: 4.3 Mil/uL (ref 3.87–5.11)
RDW: 15.6 % — ABNORMAL HIGH (ref 11.5–15.5)
WBC: 3.9 10*3/uL — ABNORMAL LOW (ref 4.0–10.5)

## 2019-12-04 LAB — LIPASE: Lipase: 7 U/L — ABNORMAL LOW (ref 11.0–59.0)

## 2019-12-04 LAB — TSH: TSH: 0.44 u[IU]/mL (ref 0.35–4.50)

## 2019-12-04 NOTE — Patient Instructions (Addendum)
Start prilosec 20 mg  daily  Please stop at the lab to have labs drawn.  Start gentle chest wall stretching  We will call with referral to psychology.  Decrease caffeine gradually to stop as able, increase water.  [phone]   Behavoiral Health Help Hotline

## 2019-12-04 NOTE — Assessment & Plan Note (Signed)
?   Secondary to mood vs high caffeine intake. Decrease caffeine and follow up in 4 weeks.

## 2019-12-04 NOTE — Assessment & Plan Note (Signed)
Discussed relationship of mood to physical symptoms.  Refer to psychology for counseling. Close follow up in 1 months to re-eval. May consider medication at that time.

## 2019-12-04 NOTE — Assessment & Plan Note (Signed)
No clear cardiac or pulmonary ewtiology.  Most liekly due to Mood vs GI vs costochondritis.   Eval with labs .  Start PPI.  Gentle chest wall stretching.  Treat mood.

## 2019-12-04 NOTE — Progress Notes (Signed)
Chief Complaint  Patient presents with  . Chest Pain    when taking a deep breath and with movement    History of Present Illness: HPI  29 year old female presents with new onset chest pain with deep breaths and movement.  She reports this started this AM.. sudden onset when awoke this AM.   As the morning started to continue... each breath deep.. had sharp pain centrally over sternum. When resting from talking.. it improved then gets worse with talking again.  Worse with moving shoulders. No clear exertional pain.  no cough, no congestion, no fever.  No SOB, no wheezing.  No palpitations.  No heartburn, no reflux lately.  No new meds, no foods.   Some increase in stress in general   Mood issues ongoing x years, worse  In last few months.    She has depression and anxiety. Trouble falling asleep and staying asleep. Feeling bad, trouble concentrating, figity or restless. 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   No blood loss, menses heavy despite IUD    PGF CHF, COPD MGM; COPD no MI early age   BIlateral breast pain x 1 month, no massess noted, no nipple discharge but lactates some after birth of child 2019. Stopped breast feeding 09/2018.  She has also noted  32 oz cup ocffee a day  She drinks   This visit occurred during the SARS-CoV-2 public health emergency.  Safety protocols were in place, including screening questions prior to the visit, additional usage of staff PPE, and extensive cleaning of exam room while observing appropriate contact time as indicated for disinfecting solutions.   COVID 19 screen:  No recent travel or known exposure to COVID19 The patient denies respiratory symptoms of COVID 19 at this time. The importance of social distancing was discussed today.     Review of Systems  Constitutional: Negative for chills and fever.  HENT: Negative for congestion and ear pain.   Eyes: Negative for pain and redness.  Respiratory: Negative for cough and shortness of  breath.   Cardiovascular: Negative for chest pain, palpitations and leg swelling.  Gastrointestinal: Negative for abdominal pain, blood in stool, constipation, diarrhea, nausea and vomiting.  Genitourinary: Negative for dysuria.  Musculoskeletal: Negative for falls and myalgias.  Skin: Negative for rash.  Neurological: Negative for dizziness.  Psychiatric/Behavioral: Negative for depression. The patient is not nervous/anxious.       Past Medical History:  Diagnosis Date  . Reflux     reports that she has never smoked. She has never used smokeless tobacco. She reports current alcohol use. She reports that she does not use drugs.   Current Outpatient Medications:  .  levonorgestrel (MIRENA) 20 MCG/24HR IUD, 1 each by Intrauterine route once., Disp: , Rfl:  .  valACYclovir (VALTREX) 1000 MG tablet, Take 1 tablet (1,000 mg total) by mouth as needed., Disp: 30 tablet, Rfl: 3   Observations/Objective: Blood pressure 92/60, pulse 85, temperature 98 F (36.7 C), temperature source Temporal, height 5\' 2"  (1.575 m), weight 160 lb 8 oz (72.8 kg), SpO2 98 %, currently breastfeeding.   BP Readings from Last 3 Encounters:  12/04/19 92/60  08/18/19 121/67  08/05/19 112/68    Physical Exam Constitutional:      General: She is not in acute distress.    Appearance: Normal appearance. She is well-developed. She is not ill-appearing or toxic-appearing.  HENT:     Head: Normocephalic.     Right Ear: Hearing, tympanic membrane, ear canal and external ear  normal. Tympanic membrane is not erythematous, retracted or bulging.     Left Ear: Hearing, tympanic membrane, ear canal and external ear normal. Tympanic membrane is not erythematous, retracted or bulging.     Nose: No mucosal edema or rhinorrhea.     Right Sinus: No maxillary sinus tenderness or frontal sinus tenderness.     Left Sinus: No maxillary sinus tenderness or frontal sinus tenderness.     Mouth/Throat:     Pharynx: Uvula midline.   Eyes:     General: Lids are normal. Lids are everted, no foreign bodies appreciated.     Conjunctiva/sclera: Conjunctivae normal.     Pupils: Pupils are equal, round, and reactive to light.  Neck:     Thyroid: No thyroid mass or thyromegaly.     Vascular: No carotid bruit.     Trachea: Trachea normal.  Cardiovascular:     Rate and Rhythm: Normal rate and regular rhythm.     Pulses: Normal pulses.     Heart sounds: Normal heart sounds, S1 normal and S2 normal. No murmur. No friction rub. No gallop.   Pulmonary:     Effort: Pulmonary effort is normal. No tachypnea or respiratory distress.     Breath sounds: Normal breath sounds. No decreased breath sounds, wheezing, rhonchi or rales.  Abdominal:     General: Bowel sounds are normal.     Palpations: Abdomen is soft.     Tenderness: There is abdominal tenderness in the epigastric area and periumbilical area.  Musculoskeletal:     Cervical back: Normal range of motion and neck supple.  Skin:    General: Skin is warm and dry.     Findings: No rash.  Neurological:     Mental Status: She is alert.  Psychiatric:        Mood and Affect: Mood is not anxious or depressed.        Speech: Speech normal.        Behavior: Behavior normal. Behavior is cooperative.        Thought Content: Thought content normal.        Judgment: Judgment normal.      Assessment and Plan   Current moderate episode of major depressive disorder without prior episode (Shirley) Discussed relationship of mood to physical symptoms.  Refer to psychology for counseling. Close follow up in 1 months to re-eval. May consider medication at that time.    Epigastric pain  Eval with labs... may be contributing to decreased appetite. Hx of GERD... none now except possible occult GERD causing pain.  Treat with PPI.  Pain of both breasts ? Secondary to mood vs high caffeine intake. Decrease caffeine and follow up in 4 weeks.  Acute chest wall pain No clear cardiac or  pulmonary ewtiology.  Most liekly due to Mood vs GI vs costochondritis.   Eval with labs .  Start PPI.  Gentle chest wall stretching.  Treat mood.     Eliezer Lofts, MD

## 2019-12-04 NOTE — Assessment & Plan Note (Signed)
Eval with labs... may be contributing to decreased appetite. Hx of GERD... none now except possible occult GERD causing pain.  Treat with PPI.

## 2019-12-04 NOTE — Telephone Encounter (Signed)
Starting this morning pt has mid sharp chest pain on and off; pain level 7; worse when moves or takes deep breath. Sometimes has upper back pain also. Pt has never had this before. No SOB,fever,H/A,dizziness, and no other covid symptoms,. No travel and no known exposure to + covid. Pt scheduled in office appt with Dr Ermalene Searing 12/04/19 at 11:40. UC & ED precautions given and pt voiced understanding. FYI to Dr Ermalene Searing.

## 2019-12-07 ENCOUNTER — Telehealth: Payer: Self-pay

## 2019-12-07 NOTE — Telephone Encounter (Signed)
Noted. ER is appropriate to determine if [facility] admission needed.

## 2019-12-07 NOTE — Telephone Encounter (Signed)
pts mom calling (DPR signed) pt is also on speaker phone with her mom. pts mom said pt needs some help. Pt is not getting out of bed. Pt has not eaten since 12/03/19. Pt is drinking sips of water now and then. pts mom is concerned that pt is dehydrated and depressed. Pt said she is wanting to die. Pt is crying. Advised pt and her mom pt needs to go to ED. Pt said she did not want to go. pts mom told her she needs help and I talked with pt and she decided she will go to [facility] ED. pts mom and pt declined 911. pts mom said she could get her there in a car. pts mom will cb with update. FYI to Dr Ermalene Searing and Lupita Leash CMA.

## 2019-12-16 ENCOUNTER — Telehealth: Payer: Self-pay | Admitting: *Deleted

## 2019-12-16 NOTE — Telephone Encounter (Signed)
Received fax from [facility] stating patient needs medication management until Psychiatric appointment.  Dr. Ermalene Searing ask that I call patient to see if she needs to schedule appointment with her to follow up or is her mood approving.  Left message for Rebecca Rollins to return my call.

## 2019-12-16 NOTE — Telephone Encounter (Signed)
Patient called back Advised of message below. She stated that she did want to come in and follow up with Dr Ermalene Searing about this. Patient is scheduled Friday @ 4:00

## 2019-12-17 NOTE — Telephone Encounter (Signed)
Pt seen by Dr Ermalene Searing on 12/04/19.

## 2019-12-18 ENCOUNTER — Ambulatory Visit: Payer: BC Managed Care – PPO | Admitting: Family Medicine

## 2019-12-29 ENCOUNTER — Ambulatory Visit: Payer: BC Managed Care – PPO | Admitting: Family Medicine

## 2019-12-31 ENCOUNTER — Ambulatory Visit (INDEPENDENT_AMBULATORY_CARE_PROVIDER_SITE_OTHER): Payer: BC Managed Care – PPO | Admitting: Psychology

## 2019-12-31 DIAGNOSIS — F332 Major depressive disorder, recurrent severe without psychotic features: Secondary | ICD-10-CM

## 2020-01-07 ENCOUNTER — Ambulatory Visit (INDEPENDENT_AMBULATORY_CARE_PROVIDER_SITE_OTHER): Payer: BC Managed Care – PPO | Admitting: Psychology

## 2020-01-07 DIAGNOSIS — F332 Major depressive disorder, recurrent severe without psychotic features: Secondary | ICD-10-CM | POA: Diagnosis not present

## 2020-01-12 ENCOUNTER — Ambulatory Visit (INDEPENDENT_AMBULATORY_CARE_PROVIDER_SITE_OTHER): Payer: BC Managed Care – PPO | Admitting: Psychology

## 2020-01-12 DIAGNOSIS — F332 Major depressive disorder, recurrent severe without psychotic features: Secondary | ICD-10-CM | POA: Diagnosis not present

## 2020-01-19 ENCOUNTER — Ambulatory Visit (INDEPENDENT_AMBULATORY_CARE_PROVIDER_SITE_OTHER): Payer: BC Managed Care – PPO | Admitting: Psychology

## 2020-01-19 DIAGNOSIS — F332 Major depressive disorder, recurrent severe without psychotic features: Secondary | ICD-10-CM | POA: Diagnosis not present

## 2020-02-02 ENCOUNTER — Ambulatory Visit (INDEPENDENT_AMBULATORY_CARE_PROVIDER_SITE_OTHER): Payer: BC Managed Care – PPO | Admitting: Psychology

## 2020-02-02 DIAGNOSIS — F332 Major depressive disorder, recurrent severe without psychotic features: Secondary | ICD-10-CM | POA: Diagnosis not present

## 2020-02-10 ENCOUNTER — Ambulatory Visit: Payer: BC Managed Care – PPO | Admitting: Psychology

## 2020-02-12 ENCOUNTER — Other Ambulatory Visit: Payer: Self-pay

## 2020-02-12 ENCOUNTER — Ambulatory Visit (INDEPENDENT_AMBULATORY_CARE_PROVIDER_SITE_OTHER): Payer: BC Managed Care – PPO | Admitting: Certified Nurse Midwife

## 2020-02-12 ENCOUNTER — Encounter: Payer: Self-pay | Admitting: Certified Nurse Midwife

## 2020-02-12 ENCOUNTER — Other Ambulatory Visit (HOSPITAL_COMMUNITY)
Admission: RE | Admit: 2020-02-12 | Discharge: 2020-02-12 | Disposition: A | Discharge status: Home / Self Care | Payer: BC Managed Care – PPO | Source: Ambulatory Visit | Attending: Certified Nurse Midwife | Admitting: Certified Nurse Midwife

## 2020-02-12 VITALS — BP 116/70 | HR 83 | Ht 62.0 in | Wt 156.0 lb

## 2020-02-12 DIAGNOSIS — N898 Other specified noninflammatory disorders of vagina: Secondary | ICD-10-CM | POA: Insufficient documentation

## 2020-02-12 MED ORDER — IMIQUIMOD 5 % EX CREA: TOPICAL_CREAM | CUTANEOUS | 5 refills | Status: DC

## 2020-02-12 NOTE — Patient Instructions (Addendum)
Vaginitis  Vaginitis is irritation and swelling (inflammation) of the vagina. It happens when normal bacteria and yeast in the vagina grow too much. There are many types of this condition. Treatment will depend on the type you have. Follow these instructions at home: Lifestyle  Keep your vagina area clean and dry. ? Avoid using soap. ? Rinse the area with water.  Do not do the following until your doctor says it is okay: ? Wash and clean out the vagina (douche). ? Use tampons. ? Have sex.  Wipe from front to back after going to the bathroom.  Let air reach your vagina. ? Wear cotton underwear. ? Do not wear:  Underwear while you sleep.  Tight pants.  Thong underwear.  Underwear or nylons without a cotton panel. ? Take off any wet clothing, such as bathing suits, as soon as possible.  Use gentle, non-scented products. Do not use things that can irritate the vagina, such as fabric softeners. Avoid the following products if they are scented: ? Feminine sprays. ? Detergents. ? Tampons. ? Feminine hygiene products. ? Soaps or bubble baths.  Practice safe sex and use condoms. General instructions  Take over-the-counter and prescription medicines only as told by your doctor.  If you were prescribed an antibiotic medicine, take or use it as told by your doctor. Do not stop taking or using the antibiotic even if you start to feel better.  Keep all follow-up visits as told by your doctor. This is important. Contact a doctor if:  You have pain in your belly.  You have a fever.  Your symptoms last for more than 2-3 days. Get help right away if:  You have a fever and your symptoms get worse all of a sudden. Summary  Vaginitis is irritation and swelling of the vagina. It can happen when the normal bacteria and yeast in the vagina grow too much. There are many types.  Treatment will depend on the type you have.  Do not douche, use tampons , or have sex until your health  care provider approves. When you can return to sex, practice safe sex and use condoms. This information is not intended to replace advice given to you by your health care provider. Make sure you discuss any questions you have with your health care provider. Document Revised: 10/04/2017 Document Reviewed: 11/13/2016 Elsevier Patient Education  2020 Elsevier Inc. Imiquimod skin cream What is this medicine? IMIQUIMOD (i mi KWI mod) cream is used to treat external genital or anal warts. It is also used to treat other skin conditions such as actinic keratosis and certain types of skin cancer. This medicine may be used for other purposes; ask your health care provider or pharmacist if you have questions. COMMON BRAND NAME(S): Celesta Aver What should I tell my health care provider before I take this medicine? They need to know if you have any of these conditions:  decreased immune function  an unusual or allergic reaction to imiquimod, other medicines, foods, dyes, or preservatives  pregnant or trying to get pregnant  breast-feeding How should I use this medicine? This medicine is for external use only. Do not take by mouth. Follow the directions on the prescription label. Apply just before bedtime. Wash your hands before and after use. Apply a thin layer of cream and massage gently into the affected areas until no longer visible. Do not use in the mouth, eyes or the vagina. Use this medicine only on the affected area as directed by your  health care provider. Do not use for longer than prescribed. It is important not to use more medicine than prescribed. To do so may increase the chance of side effects. Talk to your pediatrician regarding the use of this medicine in children. While this drug may be prescribed for children as young as 61 years of age for selected conditions, precautions do apply. Overdosage: If you think you have taken too much of this medicine contact a poison control center or  emergency room at once. NOTE: This medicine is only for you. Do not share this medicine with others. What if I miss a dose? If you miss a dose, use it as soon as you can. If it is almost time for your next dose, use only that dose. Do not use double or extra doses. What may interact with this medicine? Interactions are not expected. Do not use any other medicines on the treated area without asking your doctor or health care professional. This list may not describe all possible interactions. Give your health care provider a list of all the medicines, herbs, non-prescription drugs, or dietary supplements you use. Also tell them if you smoke, drink alcohol, or use illegal drugs. Some items may interact with your medicine. What should I watch for while using this medicine? Visit your health care professional for regular checks on your progress. Do not use this medicine until the skin has healed from any other drug (example: podofilox or podophyllin resin) or surgical skin treatment. Females should receive regular pelvic exams while being treated for genital warts. Most patients see improvement within 4 weeks. It may take up to 16 weeks to see a full clearing of the warts. This medicine is not a cure. New warts may develop during or after treatment. Avoid sexual (genital, anal, oral) contact while the cream is on the skin. If warts are visible in the genital area, sexual contact should be avoided until the warts are treated. The use of latex condoms during sexual contact may reduce, but not entirely prevent, infecting others. This medicine may weaken condoms, diaphragms, cervical caps or other barrier devices and make them less effective as birth control. Do not cover the treated area with an airtight bandage. Cotton gauze dressings can be used. Cotton underwear can be worn after using this medicine on the genital or anal area. Actinic keratoses that were not seen before may appear during treatment and may  later go away. The treatment area and surrounding area may lighten or darken after treatment with this medicine. These skin color changes may be permanent in some patients. If you experience a skin reaction at the treatment site that interferes or prevents you from doing any daily activity, contact your health care provider. You may need a rest period from treatment. Treatment may be restarted once the reaction has gotten better as recommended by your doctor or health care professional. This medicine can make you more sensitive to the sun. Keep out of the sun. If you cannot avoid being in the sun, wear protective clothing and use sunscreen. Do not use sun lamps or tanning beds/booths. What side effects may I notice from receiving this medicine? Side effects that you should report to your doctor or health care professional as soon as possible:  open sores with or without drainage  skin infection  skin rash  unusual or severe skin reaction Side effects that usually do not require medical attention (report to your doctor or health care professional if they continue or are  bothersome):  burning or itching  redness of the skin (very common but is usually not painful or harmful)  scabbing, crusting, or peeling skin  skin that becomes hard or thickened  swelling of the skin This list may not describe all possible side effects. Call your doctor for medical advice about side effects. You may report side effects to FDA at 1-800-FDA-1088. Where should I keep my medicine? Keep out of the reach of children. Store between 4 and 25 degrees C (39 and 77 degrees F). Do not freeze. Throw away any unused medicine after the expiration date. Discard packet after applying to affected area. Partial packets should not be saved or reused. NOTE: This sheet is a summary. It may not cover all possible information. If you have questions about this medicine, talk to your doctor, pharmacist, or health care provider.   2020 Elsevier/Gold Standard (2008-10-05 10:33:25)

## 2020-02-15 NOTE — Progress Notes (Signed)
GYN ENCOUNTER NOTE  Subjective:       Rebecca Rollins is a 29 y.o. G55P1001 female is here for gynecologic evaluation of the following issues:  1. Increased vaginal discharge 2. Vaginal "bump"  Denies difficulty breathing or respiratory distress, chest pain, abdominal pain, excessive vaginal bleeding, dysuria, and leg pain or swelling.    Gynecologic History  Patient's last menstrual period was 01/18/2020 (approximate).  Contraception: IUD  Last Pap: 09/2018. Results were: normal  Obstetric History  OB History  Gravida Para Term Preterm AB Living  1 1 1  0 0 1  SAB TAB Ectopic Multiple Live Births  0 0 0 0 1    # Outcome Date GA Lbr Len/2nd Weight Sex Delivery Anes PTL Lv  1 Term 01/27/18 [redacted]w[redacted]d 06:35 / 00:11 4 lb 4.4 oz (1.94 kg) F Vag-Spont EPI  LIV    Past Medical History:  Diagnosis Date  . Reflux     Past Surgical History:  Procedure Laterality Date  . MOUTH SURGERY  2011    Current Outpatient Medications on File Prior to Visit  Medication Sig Dispense Refill  . levonorgestrel (MIRENA) 20 MCG/24HR IUD 1 each by Intrauterine route once.    . valACYclovir (VALTREX) 1000 MG tablet Take 1 tablet (1,000 mg total) by mouth as needed. 30 tablet 3   No current facility-administered medications on file prior to visit.    Allergies  Allergen Reactions  . Penicillins     REACTION: ? Rash    Social History   Socioeconomic History  . Marital status: Single    Spouse name: Not on file  . Number of children: Not on file  . Years of education: Not on file  . Highest education level: Not on file  Occupational History  . Not on file  Tobacco Use  . Smoking status: Never Smoker  . Smokeless tobacco: Never Used  Substance and Sexual Activity  . Alcohol use: Yes    Alcohol/week: 0.0 standard drinks    Comment: occass  . Drug use: No  . Sexual activity: Yes    Birth control/protection: I.U.D.  Other Topics Concern  . Not on file  Social History Narrative   11th  grade at [employer]   Diet: some fruits and veggies   No sports   Works at [employer]   Exercise: only in gym   [employer] and phone   Sexually active...first at 45   Social Determinants of Health   Financial Resource Strain:   . Difficulty of Paying Living Expenses:   Food Insecurity:   . Worried About Charity fundraiser in the Last Year:   . Arboriculturist in the Last Year:   Transportation Needs:   . Film/video editor (Medical):   Marland Kitchen Lack of Transportation (Non-Medical):   Physical Activity:   . Days of Exercise per Week:   . Minutes of Exercise per Session:   Stress:   . Feeling of Stress :   Social Connections:   . Frequency of Communication with Friends and Family:   . Frequency of Social Gatherings with Friends and Family:   . Attends Religious Services:   . Active Member of Clubs or Organizations:   . Attends Archivist Meetings:   Marland Kitchen Marital Status:   Intimate Partner Violence:   . Fear of Current or Ex-Partner:   . Emotionally Abused:   Marland Kitchen Physically Abused:   .  Sexually Abused:     Family History  Problem Relation Age of Onset  . Hypertension Mother   . Anemia Mother   . Diabetes Maternal Uncle   . Hypertension Maternal Grandmother   . Hypertension Maternal Grandfather     The following portions of the patient's history were reviewed and updated as appropriate: allergies, current medications, past family history, past medical history, past social history, past surgical history and problem list.  Review of Systems  ROS negative except as noted above. Information obtained from patient.   Objective:   BP 116/70   Pulse 83   Ht 5\' 2"  (1.575 m)   Wt 156 lb (70.8 kg)   LMP 01/18/2020 (Approximate)   BMI 28.53 kg/m    CONSTITUTIONAL: Well-developed, well-nourished female in no acute distress.   ABDOMEN: Soft, non distended; Non tender.  No Organomegaly.  PELVIC:  External Genitalia: Normal, small  flesh colored lump present  Vagina: Normal, swab collected  Cervix: Normal, IUD strings present   MUSCULOSKELETAL: Normal range of motion. No tenderness.  No cyanosis, clubbing, or edema.  Assessment:   1. Vaginal discharge  - Cervicovaginal ancillary only     Plan:   Vaginal swab collected, will contact patient with results.   Rx Aldara, see orders.   Reviewed red flag symptoms and when to call.   RTC as needed.    01/20/2020, CNM [facility], [facility]

## 2020-02-16 LAB — CERVICOVAGINAL ANCILLARY ONLY
Bacterial Vaginitis (gardnerella): POSITIVE — AB
Candida Glabrata: NEGATIVE
Candida Vaginitis: NEGATIVE
Chlamydia: NEGATIVE
Comment: NEGATIVE
Comment: NEGATIVE
Comment: NEGATIVE
Comment: NEGATIVE
Comment: NEGATIVE
Comment: NORMAL
Neisseria Gonorrhea: NEGATIVE
Trichomonas: NEGATIVE

## 2020-02-22 ENCOUNTER — Ambulatory Visit (INDEPENDENT_AMBULATORY_CARE_PROVIDER_SITE_OTHER): Payer: BC Managed Care – PPO | Admitting: Psychology

## 2020-02-22 DIAGNOSIS — F332 Major depressive disorder, recurrent severe without psychotic features: Secondary | ICD-10-CM | POA: Diagnosis not present

## 2020-03-16 ENCOUNTER — Ambulatory Visit: Payer: BC Managed Care – PPO | Admitting: Psychology

## 2020-03-30 ENCOUNTER — Ambulatory Visit (INDEPENDENT_AMBULATORY_CARE_PROVIDER_SITE_OTHER): Payer: BC Managed Care – PPO | Admitting: Psychology

## 2020-03-30 DIAGNOSIS — F332 Major depressive disorder, recurrent severe without psychotic features: Secondary | ICD-10-CM | POA: Diagnosis not present

## 2020-04-14 ENCOUNTER — Ambulatory Visit (INDEPENDENT_AMBULATORY_CARE_PROVIDER_SITE_OTHER): Payer: BC Managed Care – PPO | Admitting: Psychology

## 2020-04-14 DIAGNOSIS — F332 Major depressive disorder, recurrent severe without psychotic features: Secondary | ICD-10-CM | POA: Diagnosis not present

## 2020-04-26 ENCOUNTER — Ambulatory Visit (INDEPENDENT_AMBULATORY_CARE_PROVIDER_SITE_OTHER): Payer: BC Managed Care – PPO | Admitting: Psychology

## 2020-04-26 DIAGNOSIS — F332 Major depressive disorder, recurrent severe without psychotic features: Secondary | ICD-10-CM | POA: Diagnosis not present

## 2020-05-05 ENCOUNTER — Ambulatory Visit: Payer: BC Managed Care – PPO | Admitting: Psychology

## 2020-08-19 ENCOUNTER — Ambulatory Visit: Payer: BC Managed Care – PPO | Admitting: Certified Nurse Midwife

## 2020-08-26 ENCOUNTER — Encounter: Payer: Self-pay | Admitting: Certified Nurse Midwife

## 2020-08-26 ENCOUNTER — Other Ambulatory Visit: Payer: Self-pay

## 2020-08-26 ENCOUNTER — Ambulatory Visit (INDEPENDENT_AMBULATORY_CARE_PROVIDER_SITE_OTHER): Payer: BC Managed Care – PPO | Admitting: Certified Nurse Midwife

## 2020-08-26 VITALS — BP 120/76 | HR 77 | Ht 62.0 in | Wt 172.9 lb

## 2020-08-26 DIAGNOSIS — A630 Anogenital (venereal) warts: Secondary | ICD-10-CM | POA: Diagnosis not present

## 2020-08-26 NOTE — Progress Notes (Signed)
GYN ENCOUNTER NOTE  Subjective:       Rebecca Rollins is a 29 y.o. G21P1001 female is here for gynecologic evaluation of the following issues:  1. Reoccurrence of vaginal "bumps", no relief with Aldara   Patient last seen in office on 02/12/2020; for further details, please see previous note.   Denies difficulty breathing or respiratory distress, chest pain, abdominal pain, excessive vaginal bleeding, dysuria, and leg pain or swelling.    Gynecologic History  Patient's last menstrual period was 08/25/2020. Period Cycle (Days): 28 Period Duration (Days): 5 Period Pattern: Regular Menstrual Flow: Heavy Menstrual Control: Maxi pad, Tampon Menstrual Control Change Freq (Hours): 2 Dysmenorrhea: None Dysmenorrhea Symptoms: Headache  Contraception: IUD  Last Pap: 09/2018. Results were: normal  Obstetric History  OB History  Gravida Para Term Preterm AB Living  1 1 1  0 0 1  SAB TAB Ectopic Multiple Live Births  0 0 0 0 1    # Outcome Date GA Lbr Len/2nd Weight Sex Delivery Anes PTL Lv  1 Term 01/27/18 [redacted]w[redacted]d 06:35 / 00:11 4 lb 4.4 oz (1.94 kg) F Vag-Spont EPI  LIV    Past Medical History:  Diagnosis Date  . Reflux     Past Surgical History:  Procedure Laterality Date  . MOUTH SURGERY  2011    Current Outpatient Medications on File Prior to Visit  Medication Sig Dispense Refill  . levonorgestrel (MIRENA) 20 MCG/24HR IUD 1 each by Intrauterine route once.    . valACYclovir (VALTREX) 1000 MG tablet Take 1 tablet (1,000 mg total) by mouth as needed. 30 tablet 3  . imiquimod (ALDARA) 5 % cream Apply topically 3 (three) times a week. Apply until total clearance or maximum of 16 weeks (Patient not taking: Reported on 08/26/2020) 24 each 5   No current facility-administered medications on file prior to visit.    Allergies  Allergen Reactions  . Penicillins     REACTION: ? Rash    Social History   Socioeconomic History  . Marital status: Single    Spouse name: Not on  file  . Number of children: Not on file  . Years of education: Not on file  . Highest education level: Not on file  Occupational History  . Not on file  Tobacco Use  . Smoking status: Never Smoker  . Smokeless tobacco: Never Used  Vaping Use  . Vaping Use: Never used  Substance and Sexual Activity  . Alcohol use: Yes    Alcohol/week: 0.0 standard drinks    Comment: occass  . Drug use: No  . Sexual activity: Not Currently    Birth control/protection: I.U.D.  Other Topics Concern  . Not on file  Social History Narrative   Social Determinants of Health   Financial Resource Strain:   . Difficulty of Paying Living Expenses: Not on file  Food Insecurity:   . Worried About 08/28/2020 in the Last Year: Not on file  . Ran Out of Food in the Last Year: Not on file  Transportation Needs:   . Lack of Transportation (Medical): Not on file  . Lack of Transportation (Non-Medical): Not on file  Physical Activity:   . Days of Exercise per Week: Not on file  . Minutes of Exercise per Session: Not on file  Stress:   . Feeling of Stress : Not on file  Social Connections:   . Frequency of Communication with Friends and Family: Not on file  . Frequency of Social Gatherings  with Friends and Family: Not on file  . Attends Religious Services: Not on file  . Active Member of Clubs or Organizations: Not on file  . Attends Banker Meetings: Not on file  . Marital Status: Not on file  Intimate Partner Violence:   . Fear of Current or Ex-Partner: Not on file  . Emotionally Abused: Not on file  . Physically Abused: Not on file  . Sexually Abused: Not on file    Family History  Problem Relation Age of Onset  . Hypertension Mother   . Anemia Mother   . Diabetes Maternal Uncle   . Hypertension Maternal Grandmother   . Hypertension Maternal Grandfather     The following portions of the patient's history were reviewed and updated as appropriate: allergies, current  medications, past family history, past medical history, past social history, past surgical history and problem list.  Review of Systems  ROS negative except as noted above. Information obtained from patient.   Objective:   BP 120/76   Pulse 77   Ht 5\' 2"  (1.575 m)   Wt 172 lb 14.4 oz (78.4 kg)   LMP 08/25/2020   BMI 31.62 kg/m    CONSTITUTIONAL: Well-developed, well-nourished female in no acute distress.   PELVIC:  External Genitalia: Normal except for diffuse HPV lesions   Vagina: Normal except for diffuse HPV lesions at introitus  Rectum: Diffuse HPV lesions present external   MUSCULOSKELETAL: Normal range of motion. No tenderness.  No cyanosis, clubbing, or edema.     Assessment:   1. HPV (human papilloma virus) anogenital infection   Plan:   Education provided regarding HPV and treatment options (see MyChart and AVS), no success with Aldara in the past.  CNM will contact patient to set up in office treatment with [facility].   Reviewed red flag symptoms and when to call.   RTC as previously scheduled or sooner if needed.    08/27/2020, CNM [facility], [facility] 08/26/20 6:00 PM

## 2020-08-26 NOTE — Patient Instructions (Signed)
Human Papillomavirus Human papillomavirus (HPV) is the most common sexually transmitted infection (STI). It easily spreads from person to person (is very contagious). There are many types of HPV. HPV often does not cause symptoms. Sometimes it may cause genital warts that can be seen and felt. There may also be wart-like lesions in the throat. You can have HPV for a long time and not know it. You may spread HPV to others without knowing it. Certain types of HPV may cause cancer. What are the causes? HPV is caused by a virus that spreads from person to person through sex. What increases the risk? You may be more likely to get HPV if you have or have had:  Unprotected sex of any kind.  Several sex partners.  A sex partner who has other sex partners.  Another STI.  A weak disease-fighting system (immune system).  Damaged skin in the genital, oral, or anal area. What are the signs or symptoms? Most people who have HPV do not have any symptoms. If you have symptoms, they may include:  Wart-like lesions in the throat from having oral sex.  Warts on the infected areas.  Genital warts that may itch, burn, bleed, or be painful during sex. How is this treated? There is no treatment for the virus itself. However, there are treatments for the health problems and symptoms HPV can cause. Your doctor may treat HPV by:  Giving medicines that are creams, lotions, liquids, or gels. These medicines may be injected into or put onto genital or anal warts.  Applying one of the following to the genital or anal warts: ? Extreme cold. ? An intense beam of light (laser treatment). ? Extreme heat.  Doing surgery to remove the genital or anal warts. Your doctor will monitor you closely after you are treated. HPV can come back and you may need treatment again. Follow these instructions at home: Medicines  Take over-the-counter and prescription medicines only as told by your doctor.  Do not treat  genital warts with medicines that are meant for hand warts. General instructions  Do not touch or scratch the warts.  Do not have sex while you are being treated.  Do not douche or use tampons during treatment (for women).  Tell your sex partner about your infection. He or she may also need to be treated.  If you get pregnant, tell your doctor that you have HPV. Your doctor will monitor you during the pregnancy.  Keep all follow-up visits as told by your doctor. This is important. How is this prevented?  Talk with your doctor about getting an HPV vaccine. This can prevent some HPV infections and related cancers. You may need 2-3 doses of the vaccine, depending on your age. ? The vaccine will not work if you already have HPV. ? The vaccine is not recommended for pregnant women.  After treatment, use condoms during sex. This helps to prevent future infections.  Have only one sex partner.  Have a sex partner who does not have other sex partners.  Get Pap tests as told by your doctor. Contact a doctor if:  The treated skin gets red, swollen, or painful.  You have a fever.  You feel ill.  You feel lumps or pimples in and around your genital or anal area.  You have bleeding from the vagina.  You have bleeding from the area that was treated.  You have pain during sex. Summary  HPV is the most common STI. It easily spreads   from person to person.  HPV often does not cause any symptoms.  HPV vaccine can prevent some HPV infections and related cancers.  There is no treatment for the virus itself. However, there are treatments for the health problems and symptoms HPV can cause. This information is not intended to replace advice given to you by your health care provider. Make sure you discuss any questions you have with your health care provider. Document Revised: 02/12/2019 Document Reviewed: 06/19/2018 Elsevier Patient Education  2020 Elsevier Inc.  

## 2020-09-05 ENCOUNTER — Telehealth: Payer: Self-pay | Admitting: Certified Nurse Midwife

## 2020-09-05 NOTE — Telephone Encounter (Signed)
Telephone call to patient, verified full name and date of birth.   Inform Podocon-25 in house. Appointment scheduled for 09/12/2020 at 1545. Patient agrees to plan of care.    Serafina Royals, CNM [facility], [facility] 09/05/20 5:33 PM

## 2020-09-12 ENCOUNTER — Ambulatory Visit (INDEPENDENT_AMBULATORY_CARE_PROVIDER_SITE_OTHER): Payer: BC Managed Care – PPO | Admitting: Certified Nurse Midwife

## 2020-09-12 ENCOUNTER — Encounter: Payer: Self-pay | Admitting: Certified Nurse Midwife

## 2020-09-12 ENCOUNTER — Other Ambulatory Visit: Payer: Self-pay

## 2020-09-12 VITALS — BP 118/81 | HR 90 | Ht 62.0 in | Wt 175.8 lb

## 2020-09-12 DIAGNOSIS — A630 Anogenital (venereal) warts: Secondary | ICD-10-CM | POA: Diagnosis not present

## 2020-09-12 NOTE — Progress Notes (Signed)
Pt present to have a bump removed. Pt stated that she was doing well.

## 2020-09-12 NOTE — Progress Notes (Addendum)
Patient identified, verbal consent obtained for procedure, time out performed.    BP 118/81   Pulse 90   Ht 5\' 2"  (1.575 m)   Wt 175 lb 12.8 oz (79.7 kg)   LMP 08/25/2020   BMI 32.15 kg/m   Areas of typical appearing diffuse genital warts noted on labia near vaginal introitus (6), perineum (8) and anus (12)-dense cluster.  Surrounding area coated with water based lubricant and Podocon-25 applied until warts had white appearance.   Patient tolerated the procedure well.    Post procedure instructions given and patient told to wash area in thirty minutes.  Reviewed red flag symptoms and when to call.   Return in 1-2 week for next treatment and follow up or sooner if needed.    08/27/2020, CNM [facility], [facility] 09/12/20 4:32 PM

## 2020-09-12 NOTE — Patient Instructions (Addendum)
Podophyllum resin topical solution What is this medicine? PODOPHYLLUM (Poe-DOF-il-um) is used to treat external genital warts. This medicine may be used for other purposes; ask your health care provider or pharmacist if you have questions. COMMON BRAND NAME(S): Podocon-25 What should I tell my health care provider before I take this medicine? They need to know if you have any of these conditions:  diabetes  peripheral vascular disease  skin conditions or sensitivity  an unusual or allergic reaction to podophyllum, other medicines, foods, dyes, or preservatives  pregnant or trying to get pregnant  breast-feeding How should I use this medicine? This medicine is for external use only. It is given by a health care professional. Talk to your pediatrician regarding the use of this medicine in children. Special care may be needed. Overdosage: If you think you have taken too much of this medicine contact a poison control center or emergency room at once. NOTE: This medicine is only for you. Do not share this medicine with others. What if I miss a dose? It is important not to miss your dose. Call your doctor or health care professional if you are unable to keep an appointment. What may interact with this medicine? Interactions are not expected. Do not use any other skin products on the same area of skin without asking your doctor or health care professional. This list may not describe all possible interactions. Give your health care provider a list of all the medicines, herbs, non-prescription drugs, or dietary supplements you use. Also tell them if you smoke, drink alcohol, or use illegal drugs. Some items may interact with your medicine. What should I watch for while using this medicine? Visit your doctor or health care professional for regular checks on your progress. Females should receive regular pelvic exams while being treated for genital warts. Most patients see improvement within 4  weeks. It may take up to 16 weeks to see a full clearing of the warts. This medicine is not a cure. New warts may develop during or after treatment. The only way to prevent infecting others with the HPV virus (the virus that causes genital warts) is to avoid direct skin-to-skin contact. If warts are visible in the genital area, sexual contact should be avoided until the warts are treated. The use of latex condoms during sexual contact may reduce, but not entirely prevent, infecting others. If you're a woman, do not get pregnant for at least 2 months after your last dose. If you do get pregnant, tell your doctor. What side effects may I notice from receiving this medicine? Side effects that you should report to your doctor or health care professional as soon as possible:  allergic reactions like skin rash, itching or hives, swelling of the face, lips, or tongue  muscle pain  pain, tingling, numbness in the hands or feet  stomach pain  unusual bleeding or bruising Side effects that usually do not require medical attention (report to your doctor or health care professional if they continue or are bothersome):  skin irritation at site of use This list may not describe all possible side effects. Call your doctor for medical advice about side effects. You may report side effects to FDA at 1-800-FDA-1088. Where should I keep my medicine? This drug is given in a hospital or clinic and will not be stored at home. NOTE: This sheet is a summary. It may not cover all possible information. If you have questions about this medicine, talk to your doctor, pharmacist, or health  care provider.  2020 Elsevier/Gold Standard (2015-11-24 08:18:20)   Genital Warts Genital warts are a common STD (sexually transmitted disease). They may appear as small bumps on the skin of the genital and anal areas. They sometimes become irritated and cause pain. Genital warts are easily passed to other people through sexual  contact. Many people do not know that they are infected. They may be infected for years without symptoms. However, even if they do not have symptoms, they can pass the infection to their sexual partners. Getting treatment is important because genital warts can lead to other problems. In females, the virus that causes genital warts may increase the risk for cervical cancer. What are the causes? This condition is caused by a virus that is called human papillomavirus (HPV). HPV is spread by having unprotected sex with an infected person. It can be spread through vaginal, anal, and oral sex. What increases the risk? You are more likely to develop this condition if:  You have unprotected sex.  You have multiple sexual partners.  You are sexually active before age 17.  You are a man who isnot circumcised.  You have a female sexual partner who is not circumcised.  You have a weakened body defense system (immune system) due to disease or medicine.  You smoke. What are the signs or symptoms? Symptoms of this condition include:  Small growths in the genital area or anal area. These warts often grow in clusters.  Itching and irritation in the genital area or anal area.  Bleeding from the warts.  Pain during sex. How is this diagnosed? This condition is diagnosed based on:  Your symptoms.  A physical exam. You may also have other tests, including:  Biopsy. A tissue sample is removed so it can be checked under a microscope.  Colposcopy. In females, a magnifying tool is used to examine the vagina and cervix. Certain solutions may be used to make the HPV cells change color so they can be seen more easily.  A Pap test in females.  Tests for other STDs. How is this treated? This condition may be treated with:  Medicines, such as: ? Solutions or creams applied to your skin (topical).  Procedures, such as: ? Freezing the warts with liquid nitrogen (cryotherapy). ? Burning the warts  with a laser or electric probe (electrocautery). ? Surgery to remove the warts. Follow these instructions at home: Medicines   Apply over-the-counter and prescription medicines only as told by your health care provider.  Do not treat genital warts with medicines that are used for treating hand warts.  Talk with your health care provider about using over-the-counter anti-itch creams. Instructions for women  Get screened regularly for cervical cancer. Women who have genital warts are at an increased risk for this cancer. This type of cancer is slow growing and can be cured if it is found early.  If you become pregnant, tell your health care provider that you have had an HPV infection. Your health care provider will monitor you closely during pregnancy to be sure that your baby is safe. General instructions  Do not touch or scratch the warts.  Do not have sex until your treatment has been completed.  Tell your current and past sexual partners about your condition because they may also need treatment.  After treatment, use condoms during sex to prevent future infections.  Keep all follow-up visits as told by your health care provider. This is important. How is this prevented? Talk with your  health care provider about getting the HPV vaccine. The vaccine:  Can prevent some HPV infections and cancers.  Is recommended for males and females who are 85-62 years old.  Is not recommended for pregnant women.  Will not work if you already have HPV. Contact a health care provider if you:  Have redness, swelling, or pain in the area of the treated skin.  Have a fever.  Feel generally ill.  Feel lumps in and around your genital or anal area.  Have bleeding in your genital or anal area.  Have pain during sex. Summary  Genital warts are a common STD (sexually transmitted disease). It may appear as small bumps on the genital and anal areas.  This condition is caused by a virus that  is called human papillomavirus (HPV). HPV is spread by having unprotected sex with an infected person. It can be spread through vaginal, anal, and oral sex.  Treatment is important because genital warts can lead to other problems. In females, the virus that causes genital warts may increase the risk for cervical cancer.  This condition may be treated with medicine that is applied to the skin, or procedures to remove the warts.  The HPV vaccine can prevent some HPV infections and cancers. It is recommended that the vaccine be given to males and females who are 28-75 years old. This information is not intended to replace advice given to you by your health care provider. Make sure you discuss any questions you have with your health care provider. Document Revised: 11/26/2017 Document Reviewed: 11/26/2017 Elsevier Patient Education  2020 [employer].

## 2020-09-19 ENCOUNTER — Other Ambulatory Visit: Payer: Self-pay

## 2020-09-19 ENCOUNTER — Encounter: Payer: Self-pay | Admitting: Certified Nurse Midwife

## 2020-09-19 ENCOUNTER — Ambulatory Visit (INDEPENDENT_AMBULATORY_CARE_PROVIDER_SITE_OTHER): Payer: BC Managed Care – PPO | Admitting: Certified Nurse Midwife

## 2020-09-19 VITALS — BP 122/78 | HR 89 | Ht 62.0 in | Wt 176.5 lb

## 2020-09-19 DIAGNOSIS — A630 Anogenital (venereal) warts: Secondary | ICD-10-CM

## 2020-09-19 MED ORDER — PODOPHYLLUM RESIN 25 % EX SOLN: Freq: Once | CUTANEOUS | Status: DC

## 2020-09-19 NOTE — Patient Instructions (Signed)
Genital Warts Genital warts are a common STD (sexually transmitted disease). They may appear as small bumps on the skin of the genital and anal areas. They sometimes become irritated and cause pain. Genital warts are easily passed to other people through sexual contact. Many people do not know that they are infected. They may be infected for years without symptoms. However, even if they do not have symptoms, they can pass the infection to their sexual partners. Getting treatment is important because genital warts can lead to other problems. In females, the virus that causes genital warts may increase the risk for cervical cancer. What are the causes? This condition is caused by a virus that is called human papillomavirus (HPV). HPV is spread by having unprotected sex with an infected person. It can be spread through vaginal, anal, and oral sex. What increases the risk? You are more likely to develop this condition if:  You have unprotected sex.  You have multiple sexual partners.  You are sexually active before age 16.  You are a man who isnot circumcised.  You have a female sexual partner who is not circumcised.  You have a weakened body defense system (immune system) due to disease or medicine.  You smoke. What are the signs or symptoms? Symptoms of this condition include:  Small growths in the genital area or anal area. These warts often grow in clusters.  Itching and irritation in the genital area or anal area.  Bleeding from the warts.  Pain during sex. How is this diagnosed? This condition is diagnosed based on:  Your symptoms.  A physical exam. You may also have other tests, including:  Biopsy. A tissue sample is removed so it can be checked under a microscope.  Colposcopy. In females, a magnifying tool is used to examine the vagina and cervix. Certain solutions may be used to make the HPV cells change color so they can be seen more easily.  A Pap test in  females.  Tests for other STDs. How is this treated? This condition may be treated with:  Medicines, such as: ? Solutions or creams applied to your skin (topical).  Procedures, such as: ? Freezing the warts with liquid nitrogen (cryotherapy). ? Burning the warts with a laser or electric probe (electrocautery). ? Surgery to remove the warts. Follow these instructions at home: Medicines   Apply over-the-counter and prescription medicines only as told by your health care provider.  Do not treat genital warts with medicines that are used for treating hand warts.  Talk with your health care provider about using over-the-counter anti-itch creams. Instructions for women  Get screened regularly for cervical cancer. Women who have genital warts are at an increased risk for this cancer. This type of cancer is slow growing and can be cured if it is found early.  If you become pregnant, tell your health care provider that you have had an HPV infection. Your health care provider will monitor you closely during pregnancy to be sure that your baby is safe. General instructions  Do not touch or scratch the warts.  Do not have sex until your treatment has been completed.  Tell your current and past sexual partners about your condition because they may also need treatment.  After treatment, use condoms during sex to prevent future infections.  Keep all follow-up visits as told by your health care provider. This is important. How is this prevented? Talk with your health care provider about getting the HPV vaccine. The vaccine:    Can prevent some HPV infections and cancers.  Is recommended for males and females who are 74-62 years old.  Is not recommended for pregnant women.  Will not work if you already have HPV. Contact a health care provider if you:  Have redness, swelling, or pain in the area of the treated skin.  Have a fever.  Feel generally ill.  Feel lumps in and around  your genital or anal area.  Have bleeding in your genital or anal area.  Have pain during sex. Summary  Genital warts are a common STD (sexually transmitted disease). It may appear as small bumps on the genital and anal areas.  This condition is caused by a virus that is called human papillomavirus (HPV). HPV is spread by having unprotected sex with an infected person. It can be spread through vaginal, anal, and oral sex.  Treatment is important because genital warts can lead to other problems. In females, the virus that causes genital warts may increase the risk for cervical cancer.  This condition may be treated with medicine that is applied to the skin, or procedures to remove the warts.  The HPV vaccine can prevent some HPV infections and cancers. It is recommended that the vaccine be given to males and females who are 55-67 years old. This information is not intended to replace advice given to you by your health care provider. Make sure you discuss any questions you have with your health care provider. Document Revised: 11/26/2017 Document Reviewed: 11/26/2017 Elsevier Patient Education  2020 Else[employer] Inc[employer]    Podophyllum resin topical solution What is this medicine? PODOPHYLLUM (Poe-DOF-il-um) is used to treat external genital warts. This medicine may be used for other purposes; ask your health care provider or pharmacist if you have questions. COMMON BRAND NAME(S): Podocon-25 What should I tell my health care provider before I take this medicine? They need to know if you have any of these conditions:  diabetes  peripheral vascular disease  skin conditions or sensitivity  an unusual or allergic reaction to podophyllum, other medicines, foods, dyes, or preservatives  pregnant or trying to get pregnant  breast-feeding How should I use this medicine? This medicine is for external use only. It is given by a health care professional. Talk to your pediatrician regarding the  use of this medicine in children. Special care may be needed. Overdosage: If you think you have taken too much of this medicine contact a poison control center or emergency room at once. NOTE: This medicine is only for you. Do not share this medicine with others. What if I miss a dose? It is important not to miss your dose. Call your doctor or health care professional if you are unable to keep an appointment. What may interact with this medicine? Interactions are not expected. Do not use any other skin products on the same area of skin without asking your doctor or health care professional. This list may not describe all possible interactions. Give your health care provider a list of all the medicines, herbs, non-prescription drugs, or dietary supplements you use. Also tell them if you smoke, drink alcohol, or use illegal drugs. Some items may interact with your medicine. What should I watch for while using this medicine? Visit your doctor or health care professional for regular checks on your progress. Females should receive regular pelvic exams while being treated for genital warts. Most patients see improvement within 4 weeks. It may take up to 16 weeks to see a full clearing of  the warts. This medicine is not a cure. New warts may develop during or after treatment. The only way to prevent infecting others with the HPV virus (the virus that causes genital warts) is to avoid direct skin-to-skin contact. If warts are visible in the genital area, sexual contact should be avoided until the warts are treated. The use of latex condoms during sexual contact may reduce, but not entirely prevent, infecting others. If you're a woman, do not get pregnant for at least 2 months after your last dose. If you do get pregnant, tell your doctor. What side effects may I notice from receiving this medicine? Side effects that you should report to your doctor or health care professional as soon as possible:  allergic  reactions like skin rash, itching or hives, swelling of the face, lips, or tongue  muscle pain  pain, tingling, numbness in the hands or feet  stomach pain  unusual bleeding or bruising Side effects that usually do not require medical attention (report to your doctor or health care professional if they continue or are bothersome):  skin irritation at site of use This list may not describe all possible side effects. Call your doctor for medical advice about side effects. You may report side effects to FDA at 1-800-FDA-1088. Where should I keep my medicine? This drug is given in a hospital or clinic and will not be stored at home. NOTE: This sheet is a summary. It may not cover all possible information. If you have questions about this medicine, talk to your doctor, pharmacist, or health care provider.  2020 Elsevier/Gold Standard (2015-11-24 08:18:20)

## 2020-09-19 NOTE — Progress Notes (Addendum)
Patient identified, verbal consent obtained, and  time out performed.    Areas of typical appearing genital warts noted at vaginal introitus (6) and anus (8).  Surrounding area coated with water based lubricant and Podocon-25 applied until warts had white appearance.   Patient tolerated the procedure well.    Post procedure instructions given and patient told to wash area in thirty to forty-five minutes.  Return in 2-3 weeks for next treatment.   Serafina Royals, CNM [facility], [facility] 09/19/20 4:40 PM

## 2020-10-10 ENCOUNTER — Encounter: Payer: BC Managed Care – PPO | Admitting: Certified Nurse Midwife

## 2020-10-17 ENCOUNTER — Encounter: Payer: Self-pay | Admitting: Certified Nurse Midwife

## 2020-10-17 ENCOUNTER — Ambulatory Visit (INDEPENDENT_AMBULATORY_CARE_PROVIDER_SITE_OTHER): Payer: BC Managed Care – PPO | Admitting: Certified Nurse Midwife

## 2020-10-17 ENCOUNTER — Other Ambulatory Visit: Payer: Self-pay

## 2020-10-17 VITALS — BP 125/80 | HR 77 | Wt 184.1 lb

## 2020-10-17 DIAGNOSIS — A630 Anogenital (venereal) warts: Secondary | ICD-10-CM | POA: Diagnosis not present

## 2020-10-17 NOTE — Progress Notes (Signed)
Patient identified, verbal consent obtained, and  time out performed.    Areas of typical appearing genital warts noted at right labia (8) and anus (6).  Surrounding area coated with water based lubricant and Podocon-25 applied until warts had white appearance.   Patient tolerated the procedure well.    Post procedure instructions given and patient told to wash area in thirty to forty-five minutes.  Return in 2-3 weeks for next treatment or sooner if needed.    Serafina Royals, CNM [facility], [facility] 10/17/20 4:53 PM

## 2020-10-17 NOTE — Patient Instructions (Signed)
Podofilox topical solution What is this medicine? PODOFILOX (po do FIL ox) is a medication used to remove genital warts. The topical solution should only be used to treat genital warts located on the outside skin of the genitals (i.e., penis or vagina). The solution should not be used to treat warts near the rectal area. This medicine may be used for other purposes; ask your health care provider or pharmacist if you have questions. COMMON BRAND NAME(S): Condylox What should I tell my health care provider before I take this medicine? They need to know if you have any of these conditions:  an unusual or allergic reaction to podofilox, podophyllum resin, other medicines, foods, dyes or preservatives  pregnant or trying to get pregnant  breast-feeding How should I use this medicine? This medicine is for external use only on the skin. Do not take by mouth. The solution may be used on the outside (external) areas of the skin around the vagina or penis to remove warts. The solution should not be used in the area between the rectum and the genitals. The solution should only be used on the outside skin. It should not be used to treat warts that are inside the rectum, vagina, or penis. Do not use this medicine more often or for longer than directed. Do not use a larger dose than directed by your health care professional. Wash hands before and after use. Read package directions carefully before using. Apply the medication to the specific wart as instructed by your physician. Apply the solution using the applicator provided or a cotton-tipped applicator. Applicators should not be re-used. Additionally, used applicators should not be dipped into the bottle to prevent contamination. After application, you should make sure that the medication is dry before normal, untreated skin comes into contact with the treated skin. This medicine can cause severe irritation of normal skin. If contact with normal skin occurs,  immediately flush the area thoroughly with water. Talk to your pediatrician regarding the use of this medicine in children. Special care may be needed. Overdosage: If you think you have taken too much of this medicine contact a poison control center or emergency room at once. NOTE: This medicine is only for you. Do not share this medicine with others. What if I miss a dose? If you miss a dose, take it as soon as you can. If it is almost time for your next dose, take only that dose. Do not take double or extra doses. What may interact with this medicine? Interactions are not expected. Do not use any other medicines on the affected area without asking your doctor or health care professional. This list may not describe all possible interactions. Give your health care provider a list of all the medicines, herbs, non-prescription drugs, or dietary supplements you use. Also tell them if you smoke, drink alcohol, or use illegal drugs. Some items may interact with your medicine. What should I watch for while using this medicine? Visit your doctor or health care professional for regular checks on your progress. This medicine is not a cure. New warts may develop during or after treatment. Tell your doctor or health care professional if your symptoms do not start to get better within one week. The weekly treatment course can be repeated up to 4 times. If the wart does not resolve in 4 weeks, a different treatment should be considered. If you are pregnant or think you might be pregnant, contact your doctor or health care professional. Sexual (genital, oral,  anal) contact should be avoided while the medication is on the skin. The only way to prevent infecting others with the HPV virus (the virus that causes genital warts) is to avoid direct skin-to-skin contact. If warts are visible in the genital area, sexual contact should be avoided until the warts are treated. Experts advise that using latex condoms during  sexual contact may reduce, but not entirely prevent, infecting others. Avoid contact with the eyes. If eye contact occurs, patients should immediately flush the eye with large quantities of water for 15 minutes and seek medical attention. This medicine contains alcohol and is flammable. Do not use near heat, open flame, or while smoking. What side effects may I notice from receiving this medicine? Side effects that you should report to your doctor or health care professional as soon as possible:  bleeding, blistering, burning, crusting, or scabbing of treated skin  blood in the urine  dizziness  severe skin rash or swelling  vomiting (may indicate excessive dosage) Side effects that usually do not require medical attention (report to your doctor or health care professional if they continue or are bothersome):  dryness, flaking or peeling of the skin  headache  mild redness, itching or stinging of the skin This list may not describe all possible side effects. Call your doctor for medical advice about side effects. You may report side effects to FDA at 1-800-FDA-1088. Where should I keep my medicine? Keep out of the reach of children. Store at room temperature between 15 to 30 degrees C (59 to 86 degrees F). Do not freeze. Keep container tightly closed. This medicine contains alcohol and is flammable. Do not store near heat or open flame. Throw away any unused medicine after the expiration date. NOTE: This sheet is a summary. It may not cover all possible information. If you have questions about this medicine, talk to your doctor, pharmacist, or health care provider.  2020 Elsevier/Gold Standard (2008-05-24 16:13:34)    Genital Warts Genital warts are small growths in the area around the genitals or the anus. They are caused by a type of germ (HPV virus). This germ is spread from person to person during sex. It can be spread through vaginal, anal, and oral sex. Genital warts can lead  to other problems if they are not treated. A person is more likely to have this condition if he or she:  Has sex without using a condom.  Has sex with many people.  Has sex before the age of 7.  Has a weak body defense (immune) system. This condition can be treated with medicines. Your doctor may also burn or freeze the warts. In some cases, surgery may be done to remove the warts. Follow these instructions at home: Medicines   Apply over-the-counter and prescription medicines only as told by your doctor.  Do not use medicines that are meant for treating hand warts.  Talk with your doctor about using creams to treat itching. Instructions for women  Plan to have regular tests to check for cervical cancer. Your risk for this cancer increases when you have genital warts.  If you become pregnant, tell your doctor that you have had genital warts. The germ can be passed to the baby. General instructions  Do not touch or scratch the warts.  Do not have sex until your treatment is done.  Tell your current and past sexual partners about your condition. They may need treatment.  After treatment, use condoms during sex.  Keep all follow-up  visits as told by your doctor. This is important. How is this prevented? Talk with your doctor about getting the HPV shot. The HPV shot:  Can help stop some HPV infections and cancers.  Is given to males and females who are 58-66 years old.  Will not work if you already have HPV.  Is not recommended for pregnant women. Contact a doctor if:  You have redness, swelling, or pain in the area of the treated skin.  You have a fever.  You feel sick.  You feel lumps in the area around your genitals or anus.  You have bleeding in the area around your genitals or anus.  You have pain during sex. Summary  Genital warts are small growths in the areas around the genitals or the anus. They are caused by a type of germ (HPV virus).  The germ  is spread by having vaginal, anal, or oral sex without using a condom.  This condition is treated using medicines. In some cases, freezing, burning, or surgery may be done to get rid of the warts.  This condition may be prevented by getting a HPV shot. This information is not intended to replace advice given to you by your health care provider. Make sure you discuss any questions you have with your health care provider. Document Revised: 11/26/2017 Document Reviewed: 11/26/2017 Elsevier Patient Education  2020 [employer].

## 2021-01-20 ENCOUNTER — Other Ambulatory Visit: Payer: Self-pay | Admitting: Certified Nurse Midwife

## 2021-08-01 ENCOUNTER — Other Ambulatory Visit: Payer: Self-pay

## 2021-08-01 ENCOUNTER — Encounter: Payer: Self-pay | Admitting: Family Medicine

## 2021-08-01 ENCOUNTER — Telehealth (INDEPENDENT_AMBULATORY_CARE_PROVIDER_SITE_OTHER): Payer: BC Managed Care – PPO | Admitting: Family Medicine

## 2021-08-01 VITALS — Ht 62.0 in | Wt 185.0 lb

## 2021-08-01 DIAGNOSIS — F321 Major depressive disorder, single episode, moderate: Secondary | ICD-10-CM

## 2021-08-01 DIAGNOSIS — R1013 Epigastric pain: Secondary | ICD-10-CM

## 2021-08-01 NOTE — Assessment & Plan Note (Signed)
She is interested in considering a medication to treat. Counseling has been of limited benefit.  She will make appt for follow up in 2 weeks.

## 2021-08-01 NOTE — Assessment & Plan Note (Signed)
Gastritis vs GERD vs gallbladder disease. Given labs were normal in the past with epigastric pain evaluation.  Will move to [place]  eval for gallbladder disease.   Start PPI, start omeprazole 40 mg daily x 4-6 weeks.  If not improving consider re-eval with labs and in person exam.

## 2021-08-01 NOTE — Progress Notes (Signed)
VIRTUAL VISIT Due to national recommendations of social distancing due to COVID 19, a virtual visit is felt to be most appropriate for this patient at this time.   I connected with the patient on 08/01/21 at  3:40 PM EDT by virtual telehealth platform and verified that I am speaking with the correct person using two identifiers.   I discussed the limitations, risks, security and privacy concerns of performing an evaluation and management service by  virtual telehealth platform and the availability of in person appointments. I also discussed with the patient that there may be a patient responsible charge related to this service. The patient expressed understanding and agreed to proceed.  Patient location: Home Provider Location: [facility] Participants: Kerby Nora and [name]   Chief Complaint  Patient presents with   Abdominal Pain    History of Present Illness:  30 year old female presents with new onset pain in  epigastrium ongoing x 6 months.  Worsened in last week.  Getting full she feels pain at top on central abdomen, no radiation of pain through to back. Sharp but last for 15-20 minutes.  After eating she feels nauseous.  Ginger ale helps settle her stomach.  She has increase in heartburn every few days.   In past she has had epigastric pain.Marland Kitchen labs  unremarkable.  She has not taken anything for pain.  Possible worse after spicy foods.  No new medications. No NSAIDs now ( few months ago she was using tylenol)   No D/C. No fever.  COVID 19 screen No recent travel or known exposure to COVID19 The patient denies respiratory symptoms of COVID 19 at this time.  The importance of social distancing was discussed today.   Review of Systems  Constitutional:  Negative for chills and fever.  HENT:  Negative for congestion and ear pain.   Eyes:  Negative for pain and redness.  Respiratory:  Negative for cough and shortness of breath.   Cardiovascular:   Negative for chest pain, palpitations and leg swelling.  Gastrointestinal:  Positive for abdominal pain and nausea. Negative for blood in stool, constipation, diarrhea and vomiting.  Genitourinary:  Negative for dysuria.  Musculoskeletal:  Negative for falls and myalgias.  Skin:  Negative for rash.  Neurological:  Negative for dizziness.  Psychiatric/Behavioral:  Negative for depression. The patient is not nervous/anxious.      Past Medical History:  Diagnosis Date   Reflux     reports that she has never smoked. She has never used smokeless tobacco. She reports current alcohol use. She reports that she does not use drugs.   Current Outpatient Medications:    levonorgestrel (MIRENA) 20 MCG/24HR IUD, 1 each by Intrauterine route once., Disp: , Rfl:    valACYclovir (VALTREX) 1000 MG tablet, TAKE 1 TABLET BY MOUTH AS NEEDED, Disp: 30 tablet, Rfl: 0  Current Facility-Administered Medications:    podophyllum resin (PODOCON-25) 25 % external solution, , Topical, Once, Lawhorn, Vanessa [place], CNM   Observations/Objective: Height 5\' 2"  (1.575 m), weight 185 lb (83.9 kg), currently breastfeeding.  Physical Exam Constitutional:      General: She is not in acute distress.    Appearance: Normal appearance. She is well-developed. She is not ill-appearing or toxic-appearing.  HENT:     Head: Normocephalic.     Right Ear: Hearing, tympanic membrane, ear canal and external ear normal. Tympanic membrane is not erythematous, retracted or bulging.     Left Ear: Hearing, tympanic membrane, ear canal  and external ear normal. Tympanic membrane is not erythematous, retracted or bulging.     Nose: No mucosal edema or rhinorrhea.     Right Sinus: No maxillary sinus tenderness or frontal sinus tenderness.     Left Sinus: No maxillary sinus tenderness or frontal sinus tenderness.     Mouth/Throat:     Pharynx: Uvula midline.  Eyes:     General: Lids are normal. Lids are everted, no foreign bodies  appreciated.     Conjunctiva/sclera: Conjunctivae normal.     Pupils: Pupils are equal, round, and reactive to light.  Neck:     Thyroid: No thyroid mass or thyromegaly.     Vascular: No carotid bruit.     Trachea: Trachea normal.  Cardiovascular:     Rate and Rhythm: Normal rate and regular rhythm.     Pulses: Normal pulses.     Heart sounds: Normal heart sounds, S1 normal and S2 normal. No murmur heard.   No friction rub. No gallop.  Pulmonary:     Effort: Pulmonary effort is normal. No tachypnea or respiratory distress.     Breath sounds: Normal breath sounds. No decreased breath sounds, wheezing, rhonchi or rales.  Abdominal:     General: Bowel sounds are normal.     Palpations: Abdomen is soft.     Tenderness: There is no abdominal tenderness.  Musculoskeletal:     Cervical back: Normal range of motion and neck supple.  Skin:    General: Skin is warm and dry.     Findings: No rash.  Neurological:     Mental Status: She is alert.  Psychiatric:        Mood and Affect: Mood is not anxious or depressed.        Speech: Speech normal.        Behavior: Behavior normal. Behavior is cooperative.        Thought Content: Thought content normal.        Judgment: Judgment normal.     Assessment and Plan Problem List Items Addressed This Visit     Current moderate episode of major depressive disorder without prior episode [facility])    She is interested in considering a medication to treat. Counseling has been of limited benefit.  She will make appt for follow up in 2 weeks.      Epigastric pain - Primary    Gastritis vs GERD vs gallbladder disease. Given labs were normal in the past with epigastric pain evaluation.  Will move to [place]  eval for gallbladder disease.   Start PPI, start omeprazole 40 mg daily x 4-6 weeks.  If not improving consider re-eval with labs and in person exam.      Relevant Orders   US ABDOMEN LIMITED RUQ (LIVER/GB)      I discussed the assessment and  treatment plan with the patient. The patient was provided an opportunity to ask questions and all were answered. The patient agreed with the plan and demonstrated an understanding of the instructions.   The patient was advised to call back or seek an in-person evaluation if the symptoms worsen or if the condition fails to improve as anticipated.     Kerby Nora, MD

## 2021-08-01 NOTE — Patient Instructions (Addendum)
Start prilosec over the counter 2 tabs of 20 mg daily for 4-6 weeks.  We will set up an [place].  Make an appt in 2 weeks for evaluation of mood and abdominal pain.

## 2021-08-17 ENCOUNTER — Other Ambulatory Visit: Payer: Self-pay

## 2021-08-17 ENCOUNTER — Ambulatory Visit
Admission: RE | Admit: 2021-08-17 | Discharge: 2021-08-17 | Disposition: A | Discharge status: Home / Self Care | Payer: BC Managed Care – PPO | Source: Ambulatory Visit | Attending: Family Medicine | Admitting: Family Medicine

## 2021-08-17 DIAGNOSIS — R1013 Epigastric pain: Secondary | ICD-10-CM | POA: Insufficient documentation

## 2021-11-07 ENCOUNTER — Ambulatory Visit: Payer: Self-pay | Admitting: Family Medicine

## 2021-12-01 ENCOUNTER — Other Ambulatory Visit: Payer: Self-pay

## 2021-12-01 ENCOUNTER — Ambulatory Visit (INDEPENDENT_AMBULATORY_CARE_PROVIDER_SITE_OTHER): Payer: Self-pay | Admitting: Family Medicine

## 2021-12-01 VITALS — BP 110/58 | HR 94 | Temp 96.8°F | Ht 62.0 in | Wt 201.6 lb

## 2021-12-01 DIAGNOSIS — F411 Generalized anxiety disorder: Secondary | ICD-10-CM

## 2021-12-01 DIAGNOSIS — F321 Major depressive disorder, single episode, moderate: Secondary | ICD-10-CM

## 2021-12-01 MED ORDER — VENLAFAXINE HCL ER 37.5 MG PO CP24: ORAL_CAPSULE | ORAL | 1 refills | Status: DC

## 2021-12-01 NOTE — Assessment & Plan Note (Signed)
Discussed option.. chose venlafaxine given weight loss SE.  Not currently interested in  Counseling as it makes her "relive the stress".  No current SI.   Follow up in 4 weks for reassessment.

## 2021-12-01 NOTE — Patient Instructions (Signed)
Start  venlafaxine 1 tablet at night, if tolerating after 1 week, increase to 2 tabs at night.

## 2021-12-01 NOTE — Progress Notes (Signed)
Patient ID: Rebecca Rollins, female    DOB: 12/18/1990, 31 y.o.   MRN: [id number]  This visit was conducted in person.  There were no vitals taken for this visit.   CC: Chief Complaint  Patient presents with   Depression    Bothering her more over last few months    Anxiety    Bothering her more over last few months    Panic Attack    1-2 over last 3 months    Subjective:   HPI: Rebecca Rollins is a 31 y.o. female presenting on 12/01/2021 for Depression (Bothering her more over last few months ), Anxiety (Bothering her more over last few months ), and Panic Attack (1-2 over last 3 months)  MDD, GAD, chronic insomnia; She feels that her mood is  worsening over the last 3-4 months.  Has never been on meds in past. She has had occ thoughts of wanting to hurt herself.. has been less lately.  No suicide plan.  Able to fall asleep but having early morning waking.  Sleeps 5-6 hours a night.   Has had 1-2 panic attacks in last 3 months.. feels SOB.  Sister with mood issues.    Has not done well with counseling in past.  She is on mirena for birth control.  TSH was normal in 2021.    PHQ9: 19 GAD7:16  Relevant past medical, surgical, family and social history reviewed and updated as indicated. Interim medical history since our last visit reviewed. Allergies and medications reviewed and updated. Outpatient Medications Prior to Visit  Medication Sig Dispense Refill   levonorgestrel (MIRENA) 20 MCG/24HR IUD 1 each by Intrauterine route once.     valACYclovir (VALTREX) 1000 MG tablet TAKE 1 TABLET BY MOUTH AS NEEDED 30 tablet 0   Facility-Administered Medications Prior to Visit  Medication Dose Route Frequency Provider Last Rate Last Admin   podophyllum resin (PODOCON-25) 25 % external solution   Topical Once Lawhorn, Vanessa [place], CNM         Per HPI unless specifically indicated in ROS section below Review of Systems  Constitutional:  Negative for fatigue and fever.   HENT:  Negative for ear pain.   Eyes:  Negative for pain.  Respiratory:  Negative for chest tightness and shortness of breath.   Cardiovascular:  Negative for chest pain, palpitations and leg swelling.  Gastrointestinal:  Negative for abdominal pain.  Genitourinary:  Negative for dysuria.  Objective:  There were no vitals taken for this visit.  Wt Readings from Last 3 Encounters:  08/01/21 185 lb (83.9 kg)  10/17/20 184 lb 1.6 oz (83.5 kg)  09/19/20 176 lb 8 oz (80.1 kg)      Physical Exam Constitutional:      General: She is not in acute distress.    Appearance: Normal appearance. She is well-developed. She is not ill-appearing or toxic-appearing.  HENT:     Head: Normocephalic.     Right Ear: Hearing, tympanic membrane, ear canal and external ear normal. Tympanic membrane is not erythematous, retracted or bulging.     Left Ear: Hearing, tympanic membrane, ear canal and external ear normal. Tympanic membrane is not erythematous, retracted or bulging.     Nose: No mucosal edema or rhinorrhea.     Right Sinus: No maxillary sinus tenderness or frontal sinus tenderness.     Left Sinus: No maxillary sinus tenderness or frontal sinus tenderness.     Mouth/Throat:     Pharynx: Uvula midline.  Eyes:     General: Lids are normal. Lids are everted, no foreign bodies appreciated.     Conjunctiva/sclera: Conjunctivae normal.     Pupils: Pupils are equal, round, and reactive to light.  Neck:     Thyroid: No thyroid mass or thyromegaly.     Vascular: No carotid bruit.     Trachea: Trachea normal.  Cardiovascular:     Rate and Rhythm: Normal rate and regular rhythm.     Pulses: Normal pulses.     Heart sounds: Normal heart sounds, S1 normal and S2 normal. No murmur heard.   No friction rub. No gallop.  Pulmonary:     Effort: Pulmonary effort is normal. No tachypnea or respiratory distress.     Breath sounds: Normal breath sounds. No decreased breath sounds, wheezing, rhonchi or rales.   Abdominal:     General: Bowel sounds are normal.     Palpations: Abdomen is soft.     Tenderness: There is no abdominal tenderness.  Musculoskeletal:     Cervical back: Normal range of motion and neck supple.  Skin:    General: Skin is warm and dry.     Findings: No rash.  Neurological:     Mental Status: She is alert.  Psychiatric:        Mood and Affect: Mood is not anxious or depressed.        Speech: Speech normal.        Behavior: Behavior normal. Behavior is cooperative.        Thought Content: Thought content normal.        Judgment: Judgment normal.      Results for orders placed or performed in visit on 02/12/20  Cervicovaginal ancillary only  Result Value Ref Range   Neisseria Gonorrhea Negative    Chlamydia Negative    Trichomonas Negative    Bacterial Vaginitis (gardnerella) Positive (A)    Candida Vaginitis Negative    Candida Glabrata Negative    Comment      Normal Reference Range Bacterial Vaginosis - Negative   Comment Normal Reference Range Candida Species - Negative    Comment Normal Reference Range Candida Galbrata - Negative    Comment Normal Reference Range Trichomonas - Negative    Comment Normal Reference Ranger Chlamydia - Negative    Comment      Normal Reference Range Neisseria Gonorrhea - Negative    This visit occurred during the SARS-CoV-2 public health emergency.  Safety protocols were in place, including screening questions prior to the visit, additional usage of staff PPE, and extensive cleaning of exam room while observing appropriate contact time as indicated for disinfecting solutions.   COVID 19 screen:  No recent travel or known exposure to COVID19 The patient denies respiratory symptoms of COVID 19 at this time. The importance of social distancing was discussed today.   Assessment and Plan    Problem List Items Addressed This Visit     Current moderate episode of major depressive disorder without prior episode (HCC)     Discussed option.. chose venlafaxine given weight loss SE.  Not currently interested in  Counseling as it makes her "relive the stress".  No current SI.   Follow up in 4 weks for reassessment.      Relevant Medications   venlafaxine XR (EFFEXOR XR) 37.5 MG 24 hr capsule   GAD (generalized anxiety disorder) - Primary   Relevant Medications   venlafaxine XR (EFFEXOR XR) 37.5 MG 24 hr capsule  Eliezer Lofts, MD

## 2021-12-04 ENCOUNTER — Telehealth: Payer: Self-pay

## 2021-12-04 MED ORDER — VENLAFAXINE HCL ER 37.5 MG PO CP24: ORAL_CAPSULE | ORAL | 1 refills | Status: DC

## 2021-12-04 NOTE — Telephone Encounter (Signed)
Pt called in stating that pharmacy can't fill pt's venlafaxine due to the dosage. Please check for PA.

## 2021-12-04 NOTE — Telephone Encounter (Signed)
Spoke with Rebecca Rollins and advised that her insurance probably doesn't like the dosing instruction so I sent in new Rx for Venlafaxine XR 37.5 mg to take one capsule at bedtime.  Sobocinski is aware that after one week to increase to 2 capsules at bedtime and if she tolerates the 2 capsules nightly, then she will contact me to send in new Rx for Venlafaxine XR 75 mg to take one capsule at bedtime.  FYI to Dr. Ermalene Searing.

## 2022-02-19 ENCOUNTER — Other Ambulatory Visit: Payer: Self-pay | Admitting: Certified Nurse Midwife

## 2022-02-19 NOTE — Telephone Encounter (Signed)
Sounds like she needs an appointment for a physical with any provider (last saw Marcelino Duster) prior to refill.

## 2022-02-20 ENCOUNTER — Telehealth: Payer: Self-pay | Admitting: Certified Nurse Midwife

## 2022-02-20 ENCOUNTER — Other Ambulatory Visit: Payer: Self-pay

## 2022-02-20 MED ORDER — VALACYCLOVIR HCL 1 G PO TABS: 1000.0000 mg | ORAL_TABLET | ORAL | 0 refills | Status: DC | PRN

## 2022-02-20 NOTE — Telephone Encounter (Signed)
Please schedule patient for annual exam before medications can be refilled ?

## 2022-02-20 NOTE — Telephone Encounter (Signed)
Holdover refill sent.  °

## 2022-02-20 NOTE — Telephone Encounter (Signed)
Pt called to schedule phy, for her medication refill on valtrex. Pt is scheduled for 9-5. She is asking about medication in the meantime. Please advise.  ?

## 2022-06-15 ENCOUNTER — Ambulatory Visit (INDEPENDENT_AMBULATORY_CARE_PROVIDER_SITE_OTHER): Payer: Self-pay | Admitting: Family Medicine

## 2022-06-15 VITALS — BP 100/60 | HR 85 | Temp 98.4°F | Ht 62.0 in | Wt 196.1 lb

## 2022-06-15 DIAGNOSIS — R1013 Epigastric pain: Secondary | ICD-10-CM

## 2022-06-15 DIAGNOSIS — K219 Gastro-esophageal reflux disease without esophagitis: Secondary | ICD-10-CM

## 2022-06-15 MED ORDER — PANTOPRAZOLE SODIUM 40 MG PO TBEC: 40.0000 mg | DELAYED_RELEASE_TABLET | Freq: Every day | ORAL | 1 refills | Status: AC

## 2022-06-15 NOTE — Assessment & Plan Note (Signed)
Re-eval with labs. Add HPylori btreath testas she has been off PPI for a while.  Neg [place] RUQ in past.  Change to pantoprazole 40 mg daily. Avoid acidic foods, continue to avoid NSAIDs.  May need HIDA scan, but will hold in this given pain is more Epigastric than RUQ. Refer to GI as [persitent not improving for likely endoscopy.

## 2022-06-15 NOTE — Progress Notes (Signed)
Patient ID: Rebecca Rollins, female    DOB: [birth year], 31 y.o.   MRN: [id number]  This visit was conducted in person.  BP 100/60   Pulse 85   Temp 98.4 F (36.9 C) (Temporal)   Ht 5\' 2"  (1.575 m)   Wt 196 lb 2 oz (89 kg)   LMP 05/24/2022 (Approximate)   SpO2 97%   Breastfeeding No   BMI 35.87 kg/m    CC:  Chief Complaint  Patient presents with   Abdominal Pain    Upper center x few months, but has gotten worse and more daily over last few weeks. Some nausea.     Subjective:   HPI: Rebecca Rollins is a 31 y.o. female  with GAD, GERD presenting on 06/15/2022 for Abdominal Pain (Upper center x few months, but has gotten worse and more daily over last few weeks. Some nausea. )   She has history of epigastric pain in past.. multiple notes from 2021 and 2022 reviewed.   She reports she has had  pain in upper abdomen intermittently  in the last  2 years, worse in the last 2 weeks, not sure why  Sharp constant stabbing pain 8/10 on pain scale.  No issue in morning but in afternoon.. has more symptoms. Worse with eating.  Occ nausea, no vomiting No constipation, occ diarrhea. No blood in stool.   She has stopped drinking soda. Drinking prebiotic soda.. helps some. Chicken broth soup helps.   Worse with spicy foods, greasy foods. Has started using Kambucha  lately.  She has tried omeprazole 20 mg  daily, occ missing... no help.... has not been taking lately.   Mother and MGF with gallbladder issues.   No radiation of the pain.   10/22  NMl liver gallbladder.  Wt Readings from Last 3 Encounters:  06/15/22 196 lb 2 oz (89 kg)  12/01/21 201 lb 9 oz (91.4 kg)  08/01/21 185 lb (83.9 kg)   She is no longer taking the venlafaxine for anxiety as she was doing well with this.     Relevant past medical, surgical, family and social history reviewed and updated as indicated. Interim medical history since our last visit reviewed. Allergies and medications reviewed and  updated. Outpatient Medications Prior to Visit  Medication Sig Dispense Refill   levonorgestrel (MIRENA) 20 MCG/24HR IUD 1 each by Intrauterine route once.     valACYclovir (VALTREX) 1000 MG tablet Take 1 tablet (1,000 mg total) by mouth as needed. 30 tablet 0   venlafaxine XR (EFFEXOR XR) 37.5 MG 24 hr capsule Take one capsule by mouth daily at bedtime 30 capsule 1   Facility-Administered Medications Prior to Visit  Medication Dose Route Frequency Provider Last Rate Last Admin   podophyllum resin (PODOCON-25) 25 % external solution   Topical Once Lawhorn, 08/03/21, CNM         Per HPI unless specifically indicated in ROS section below Review of Systems  Constitutional:  Negative for fatigue and fever.  HENT:  Negative for congestion.   Eyes:  Negative for pain.  Respiratory:  Negative for cough and shortness of breath.   Cardiovascular:  Negative for chest pain, palpitations and leg swelling.  Gastrointestinal:  Positive for abdominal pain and nausea.  Genitourinary:  Negative for dysuria and vaginal bleeding.  Musculoskeletal:  Negative for back pain.  Neurological:  Negative for syncope, light-headedness and headaches.  Psychiatric/Behavioral:  Negative for dysphoric mood.    Objective:  BP 100/60  Pulse 85   Temp 98.4 F (36.9 C) (Temporal)   Ht 5\' 2"  (1.575 m)   Wt 196 lb 2 oz (89 kg)   LMP 05/24/2022 (Approximate)   SpO2 97%   Breastfeeding No   BMI 35.87 kg/m   Wt Readings from Last 3 Encounters:  06/15/22 196 lb 2 oz (89 kg)  12/01/21 201 lb 9 oz (91.4 kg)  08/01/21 185 lb (83.9 kg)      Physical Exam Constitutional:      General: She is not in acute distress.    Appearance: Normal appearance. She is well-developed. She is not ill-appearing or toxic-appearing.  HENT:     Head: Normocephalic.     Right Ear: Hearing, tympanic membrane, ear canal and external ear normal. Tympanic membrane is not erythematous, retracted or bulging.     Left Ear: Hearing,  tympanic membrane, ear canal and external ear normal. Tympanic membrane is not erythematous, retracted or bulging.     Nose: No mucosal edema or rhinorrhea.     Right Sinus: No maxillary sinus tenderness or frontal sinus tenderness.     Left Sinus: No maxillary sinus tenderness or frontal sinus tenderness.     Mouth/Throat:     Pharynx: Uvula midline.  Eyes:     General: Lids are normal. Lids are everted, no foreign bodies appreciated.     Conjunctiva/sclera: Conjunctivae normal.     Pupils: Pupils are equal, round, and reactive to light.  Neck:     Thyroid: No thyroid mass or thyromegaly.     Vascular: No carotid bruit.     Trachea: Trachea normal.  Cardiovascular:     Rate and Rhythm: Normal rate and regular rhythm.     Pulses: Normal pulses.     Heart sounds: Normal heart sounds, S1 normal and S2 normal. No murmur heard.    No friction rub. No gallop.  Pulmonary:     Effort: Pulmonary effort is normal. No tachypnea or respiratory distress.     Breath sounds: Normal breath sounds. No decreased breath sounds, wheezing, rhonchi or rales.  Abdominal:     General: Bowel sounds are normal.     Palpations: Abdomen is soft.     Tenderness: There is abdominal tenderness in the epigastric area. There is no right CVA tenderness, left CVA tenderness, guarding or rebound. Negative signs include Murphy's sign.  Musculoskeletal:     Cervical back: Normal range of motion and neck supple.  Skin:    General: Skin is warm and dry.     Findings: No rash.  Neurological:     Mental Status: She is alert.  Psychiatric:        Mood and Affect: Mood is not anxious or depressed.        Speech: Speech normal.        Behavior: Behavior normal. Behavior is cooperative.        Thought Content: Thought content normal.        Judgment: Judgment normal.       Results for orders placed or performed in visit on 02/12/20  Cervicovaginal ancillary only  Result Value Ref Range   Neisseria Gonorrhea Negative     Chlamydia Negative    Trichomonas Negative    Bacterial Vaginitis (gardnerella) Positive (A)    Candida Vaginitis Negative    Candida Glabrata Negative    Comment      Normal Reference Range Bacterial Vaginosis - Negative   Comment Normal Reference Range Candida Species - Negative  Comment Normal Reference Range Candida Galbrata - Negative    Comment Normal Reference Range Trichomonas - Negative    Comment Normal Reference Ranger Chlamydia - Negative    Comment      Normal Reference Range Neisseria Gonorrhea - Negative     COVID 19 screen:  No recent travel or known exposure to COVID19 The patient denies respiratory symptoms of COVID 19 at this time. The importance of social distancing was discussed today.   Assessment and Plan    Problem List Items Addressed This Visit     Epigastric pain     Re-eval with labs. Add HPylori btreath testas she has been off PPI for a while.  Neg [place] RUQ in past.  Change to pantoprazole 40 mg daily. Avoid acidic foods, continue to avoid NSAIDs.  May need HIDA scan, but will hold in this given pain is more Epigastric than RUQ. Refer to GI as [persitent not improving for likely endoscopy.      Relevant Orders   Ambulatory referral to Gastroenterology   CBC with Differential/Platelet   Comprehensive metabolic panel   Lipase   H. pylori breath test   GERD - Primary   Relevant Medications   pantoprazole (PROTONIX) 40 MG tablet     Kerby Nora, MD

## 2022-06-15 NOTE — Addendum Note (Signed)
Addended by: Alvina Chou on: 06/15/2022 02:48 PM   Modules accepted: Orders

## 2022-06-15 NOTE — Patient Instructions (Addendum)
Please stop at the lab to have labs drawn.  Stop Prilosec and start pantoprazole 40 mg daily Keep bland diet.  We will move forward with GI referral.

## 2022-06-20 LAB — COMPREHENSIVE METABOLIC PANEL
AG Ratio: 1.2 (calc) (ref 1.0–2.5)
ALT: 10 U/L (ref 6–29)
AST: 16 U/L (ref 10–30)
Albumin: 3.8 g/dL (ref 3.6–5.1)
Alkaline phosphatase (APISO): 74 U/L (ref 31–125)
BUN/Creatinine Ratio: 6 (calc) (ref 6–22)
BUN: 5 mg/dL — ABNORMAL LOW (ref 7–25)
CO2: 29 mmol/L (ref 20–32)
Calcium: 9.4 mg/dL (ref 8.6–10.2)
Chloride: 105 mmol/L (ref 98–110)
Creat: 0.82 mg/dL (ref 0.50–0.96)
Globulin: 3.3 g/dL (calc) (ref 1.9–3.7)
Glucose, Bld: 80 mg/dL (ref 65–99)
Potassium: 4.2 mmol/L (ref 3.5–5.3)
Sodium: 138 mmol/L (ref 135–146)
Total Bilirubin: 0.3 mg/dL (ref 0.2–1.2)
Total Protein: 7.1 g/dL (ref 6.1–8.1)

## 2022-06-20 LAB — H. PYLORI BREATH TEST: H. pylori Breath Test: NOT DETECTED

## 2022-06-20 LAB — CBC WITH DIFFERENTIAL/PLATELET
Absolute Monocytes: 631 cells/uL (ref 200–950)
Basophils Absolute: 18 cells/uL (ref 0–200)
Basophils Relative: 0.3 %
Eosinophils Absolute: 348 cells/uL (ref 15–500)
Eosinophils Relative: 5.9 %
HCT: 35.9 % (ref 35.0–45.0)
Hemoglobin: 11.5 g/dL — ABNORMAL LOW (ref 11.7–15.5)
Lymphs Abs: 2502 cells/uL (ref 850–3900)
MCH: 26.4 pg — ABNORMAL LOW (ref 27.0–33.0)
MCHC: 32 g/dL (ref 32.0–36.0)
MCV: 82.3 fL (ref 80.0–100.0)
MPV: 10.3 fL (ref 7.5–12.5)
Monocytes Relative: 10.7 %
Neutro Abs: 2401 cells/uL (ref 1500–7800)
Neutrophils Relative %: 40.7 %
Platelets: 308 10*3/uL (ref 140–400)
RBC: 4.36 10*6/uL (ref 3.80–5.10)
RDW: 14.2 % (ref 11.0–15.0)
Total Lymphocyte: 42.4 %
WBC: 5.9 10*3/uL (ref 3.8–10.8)

## 2022-06-20 LAB — LIPASE: Lipase: 14 U/L (ref 7–60)

## 2022-06-22 ENCOUNTER — Encounter: Payer: Self-pay | Admitting: Family Medicine

## 2022-07-10 ENCOUNTER — Encounter: Payer: Self-pay | Admitting: Certified Nurse Midwife

## 2022-07-31 ENCOUNTER — Encounter: Payer: Self-pay | Admitting: Certified Nurse Midwife

## 2022-07-31 DIAGNOSIS — Z01419 Encounter for gynecological examination (general) (routine) without abnormal findings: Secondary | ICD-10-CM

## 2022-08-16 ENCOUNTER — Encounter: Payer: Self-pay | Admitting: Certified Nurse Midwife

## 2022-08-21 ENCOUNTER — Ambulatory Visit (INDEPENDENT_AMBULATORY_CARE_PROVIDER_SITE_OTHER): Payer: Self-pay | Admitting: Certified Nurse Midwife

## 2022-08-21 ENCOUNTER — Other Ambulatory Visit (HOSPITAL_COMMUNITY)
Admission: RE | Admit: 2022-08-21 | Discharge: 2022-08-21 | Disposition: A | Discharge status: Home / Self Care | Payer: Self-pay | Source: Ambulatory Visit | Attending: Certified Nurse Midwife | Admitting: Certified Nurse Midwife

## 2022-08-21 ENCOUNTER — Encounter: Payer: Self-pay | Admitting: Certified Nurse Midwife

## 2022-08-21 VITALS — BP 119/80 | HR 89 | Resp 16 | Ht 62.0 in | Wt 206.1 lb

## 2022-08-21 DIAGNOSIS — Z124 Encounter for screening for malignant neoplasm of cervix: Secondary | ICD-10-CM

## 2022-08-21 DIAGNOSIS — Z01419 Encounter for gynecological examination (general) (routine) without abnormal findings: Secondary | ICD-10-CM

## 2022-08-21 DIAGNOSIS — Z113 Encounter for screening for infections with a predominantly sexual mode of transmission: Secondary | ICD-10-CM | POA: Insufficient documentation

## 2022-08-21 DIAGNOSIS — Z30432 Encounter for removal of intrauterine contraceptive device: Secondary | ICD-10-CM

## 2022-08-21 NOTE — Progress Notes (Signed)
   GYNECOLOGY OFFICE PROCEDURE NOTE  Rebecca Rollins is a 31 y.o. G1P1001 here for Mirena IUD removal. No GYN concerns.  Last pap smear was on today and was normal.  IUD Removal  Patient identified, informed consent performed, consent signed.  Patient was placed in the dorsal lithotomy position, normal external genitalia was noted.  A speculum was placed in the patient's vagina, normal discharge was noted, no lesions. The cervix was visualized, no lesions, no abnormal discharge.  The strings of the IUD were grasped and pulled using ring forceps. The IUD was removed in its entirety.   Patient tolerated the procedure well.    Patient will use condoms for contraception  Routine preventative health maintenance measures emphasized.   Philip Aspen, CNM

## 2022-08-21 NOTE — Patient Instructions (Signed)

## 2022-08-21 NOTE — Progress Notes (Signed)
GYNECOLOGY ANNUAL PREVENTATIVE CARE ENCOUNTER NOTE  History:     Rebecca Rollins is a 31 y.o. G37P1001 female here for a routine annual gynecologic exam.  Current complaints: IUD mood changes , food cravings, request removal today. .vaginal swab for STD testing.  Denies abnormal vaginal bleeding, discharge, pelvic pain, problems with intercourse or other gynecologic concerns.     Social Relationship: dating  Living: with grandparents Work: Administrator, arts  Exercise: 3-5 x wk  Smoke/Alcohol/drug use: alcohol occasionally, Denies smoking/vaping/drug use.   Gynecologic History No LMP recorded. (Menstrual status: IUD). Contraception: IUD - removed today see note Last Pap: 11?06/2018. Results were: normal  Last mammogram: n.a .   Upstream - 08/21/22 1436       Pregnancy Intention Screening   Does the patient's partner want to become pregnant in the next year? No    Would the patient like to discuss contraceptive options today? No      Contraception Wrap Up   Current Method IUD or IUS    End Method IUD or IUS    Contraception Counseling Provided No            The pregnancy intention screening data noted above was reviewed. Potential methods of contraception were discussed. The patient elected to proceed with Condoms.   Obstetric History OB History  Gravida Para Term Preterm AB Living  1 1 1  0 0 1  SAB IAB Ectopic Multiple Live Births  0 0 0 0 1    # Outcome Date GA Lbr Len/2nd Weight Sex Delivery Anes PTL Lv  1 Term 01/27/18 [redacted]w[redacted]d 06:35 / 00:11 4 lb 4.4 oz (1.94 kg) F Vag-Spont EPI  LIV    Past Medical History:  Diagnosis Date   Reflux     Past Surgical History:  Procedure Laterality Date   MOUTH SURGERY  2011    Current Outpatient Medications on File Prior to Visit  Medication Sig Dispense Refill   levonorgestrel (MIRENA) 20 MCG/24HR IUD 1 each by Intrauterine route once.     pantoprazole (PROTONIX) 40 MG tablet Take 1 tablet (40 mg total) by  mouth daily. 30 tablet 1   valACYclovir (VALTREX) 1000 MG tablet Take 1 tablet (1,000 mg total) by mouth as needed. (Patient not taking: Reported on 08/21/2022) 30 tablet 0   venlafaxine XR (EFFEXOR XR) 37.5 MG 24 hr capsule Take one capsule by mouth daily at bedtime (Patient not taking: Reported on 08/21/2022) 30 capsule 1   Current Facility-Administered Medications on File Prior to Visit  Medication Dose Route Frequency Provider Last Rate Last Admin   podophyllum resin (PODOCON-25) 25 % external solution   Topical Once Lawhorn, Lara Mulch, CNM        Allergies  Allergen Reactions   Penicillins     REACTION: ? Rash    Social History:  reports that she has never smoked. She has never used smokeless tobacco. She reports current alcohol use. She reports that she does not use drugs.  Family History  Problem Relation Age of Onset   Hypertension Mother    Anemia Mother    Diabetes Maternal Uncle    Hypertension Maternal Grandmother    Hypertension Maternal Grandfather     The following portions of the patient's history were reviewed and updated as appropriate: allergies, current medications, past family history, past medical history, past social history, past surgical history and problem list.  Review of Systems Pertinent items noted in HPI and remainder of comprehensive ROS otherwise  negative.  Physical Exam:  BP 119/80   Pulse 89   Resp 16   Ht 5\' 2"  (1.575 m)   Wt 206 lb 1.6 oz (93.5 kg)   BMI 37.70 kg/m  CONSTITUTIONAL: Well-developed, well-nourished female in no acute distress.  HENT:  Normocephalic, atraumatic, External right and left ear normal. Oropharynx is clear and moist EYES: Conjunctivae and EOM are normal. Pupils are equal, round, and reactive to light. No scleral icterus.  NECK: Normal range of motion, supple, no masses.  Normal thyroid.  SKIN: Skin is warm and dry. No rash noted. Not diaphoretic. No erythema. No pallor. MUSCULOSKELETAL: Normal range of  motion. No tenderness.  No cyanosis, clubbing, or edema.  2+ distal pulses. NEUROLOGIC: Alert and oriented to person, place, and time. Normal reflexes, muscle tone coordination.  PSYCHIATRIC: Normal mood and affect. Normal behavior. Normal judgment and thought content. CARDIOVASCULAR: Normal heart rate noted, regular rhythm RESPIRATORY: Clear to auscultation bilaterally. Effort and breath sounds normal, no problems with respiration noted. BREASTS: Symmetric in size. No masses, tenderness, skin changes, nipple drainage, or lymphadenopathy bilaterally.  ABDOMEN: Soft, no distention noted.  No tenderness, rebound or guarding.  PELVIC: Normal appearing external genitalia and urethral meatus; normal appearing vaginal mucosa and cervix.  No abnormal discharge noted.  Pap smear obtained.  IUD removed -see note. Normal uterine size, no other palpable masses, no uterine or adnexal tenderness.  .   Assessment and Plan:    1. Encounter for annual routine gynecological examination   Pap: Will follow up results of pap smear and manage accordingly. Mammogram : n/a  Labs: vaginal swab Declines flu vaccine  Refills: none Referral: none  Routine preventative health maintenance measures emphasized. Please refer to After Visit Summary for other counseling recommendations.      , CNM [facility],  [facility]

## 2022-08-23 LAB — CERVICOVAGINAL ANCILLARY ONLY
Bacterial Vaginitis (gardnerella): POSITIVE — AB
Candida Glabrata: NEGATIVE
Candida Vaginitis: NEGATIVE
Chlamydia: NEGATIVE
Comment: NEGATIVE
Comment: NEGATIVE
Comment: NEGATIVE
Comment: NEGATIVE
Comment: NEGATIVE
Comment: NORMAL
Neisseria Gonorrhea: NEGATIVE
Trichomonas: NEGATIVE

## 2022-08-25 ENCOUNTER — Other Ambulatory Visit: Payer: Self-pay | Admitting: Certified Nurse Midwife

## 2022-08-25 ENCOUNTER — Encounter: Payer: Self-pay | Admitting: Certified Nurse Midwife

## 2022-08-25 MED ORDER — METRONIDAZOLE 500 MG PO TABS: 500.0000 mg | ORAL_TABLET | Freq: Two times a day (BID) | ORAL | 0 refills | Status: AC

## 2022-08-28 ENCOUNTER — Encounter: Payer: Self-pay | Admitting: Certified Nurse Midwife

## 2022-08-28 LAB — CYTOLOGY - PAP
Comment: NEGATIVE
Comment: NEGATIVE
Comment: NEGATIVE
Diagnosis: UNDETERMINED — AB
HPV 16: POSITIVE — AB
HPV 18 / 45: NEGATIVE
High risk HPV: POSITIVE — AB

## 2022-10-25 ENCOUNTER — Ambulatory Visit: Payer: Self-pay | Admitting: Gastroenterology

## 2022-11-02 ENCOUNTER — Other Ambulatory Visit: Payer: Self-pay

## 2022-11-02 DIAGNOSIS — B009 Herpesviral infection, unspecified: Secondary | ICD-10-CM

## 2022-11-02 MED ORDER — VALACYCLOVIR HCL 1 G PO TABS: 1000.0000 mg | ORAL_TABLET | ORAL | 0 refills | Status: DC | PRN

## 2023-05-07 IMAGING — US US ABDOMEN LIMITED
1 series · 14 of 25 positions shown · non-contrast
Comparison: None.

CLINICAL DATA: Epigastric pain, postprandial for almost 1 year.

EXAM:
ULTRASOUND ABDOMEN LIMITED RIGHT UPPER QUADRANT

[Series 1: us abdomen limited · 0.22mm/px · 14 of 49 slices shown]
[im 1/49]
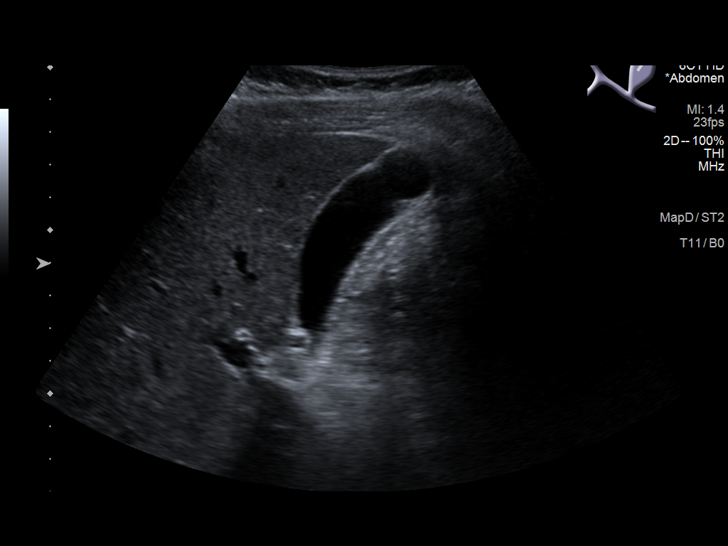
[im 5/49]
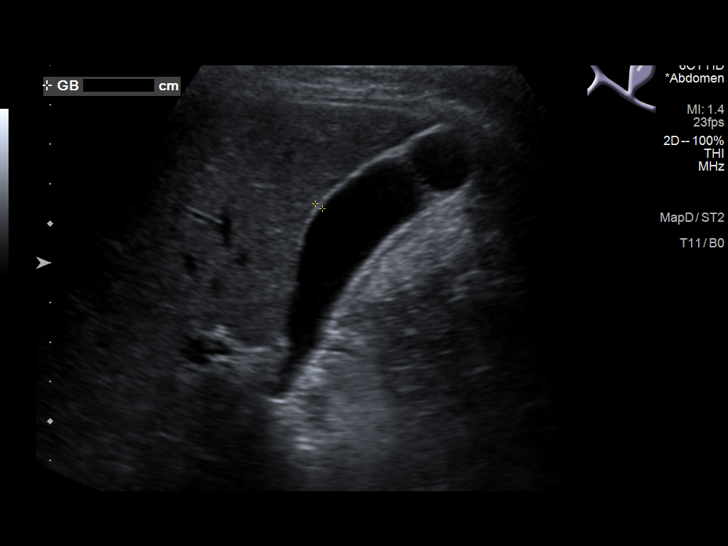
[im 9/49]
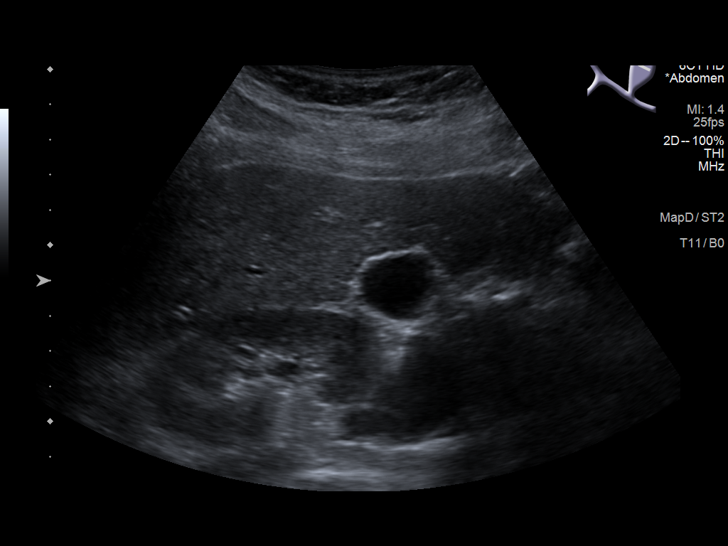
[im 13/49]
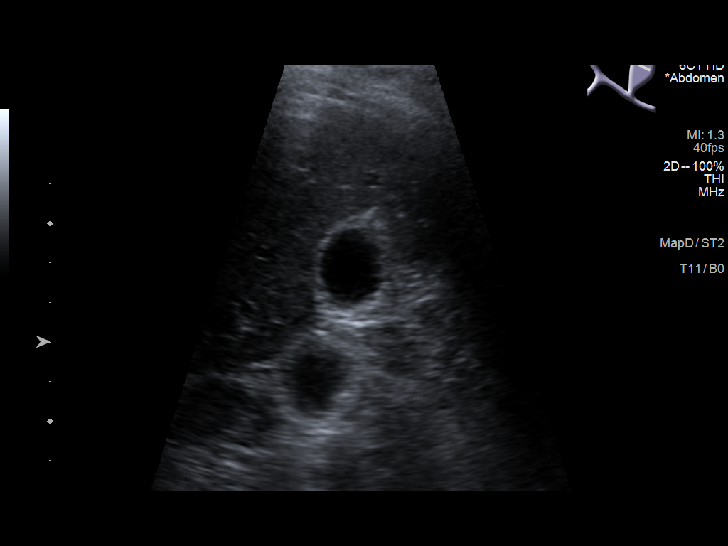
[im 17/49]
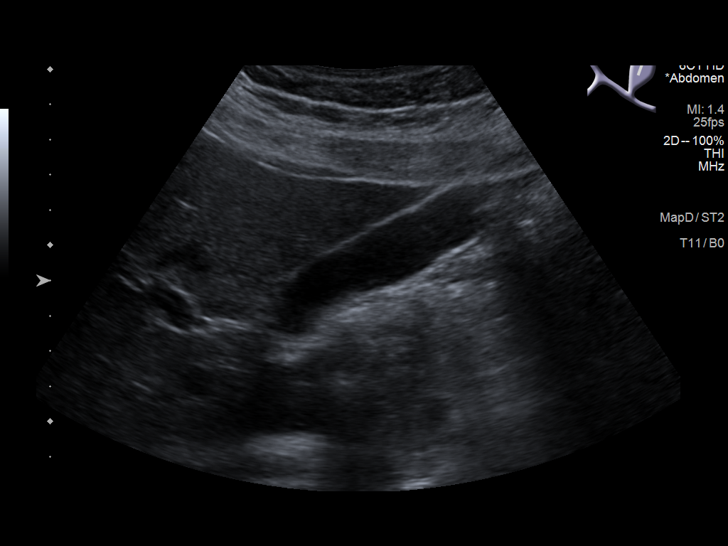
[im 19/49]
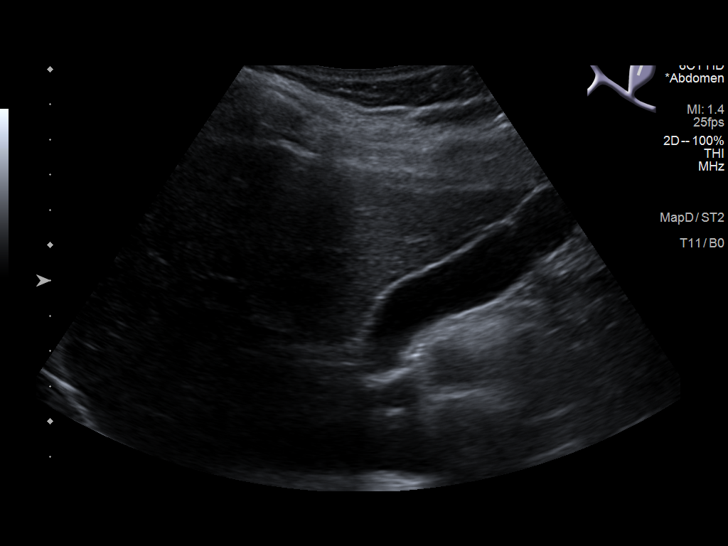
[im 23/49]
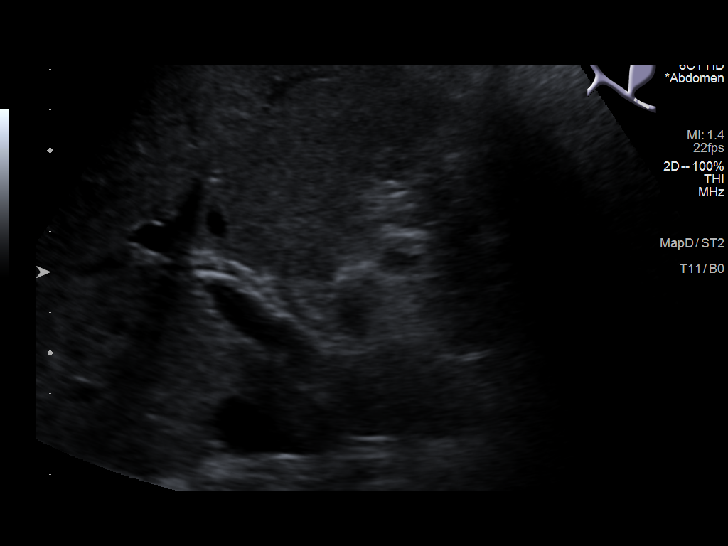
[im 27/49]
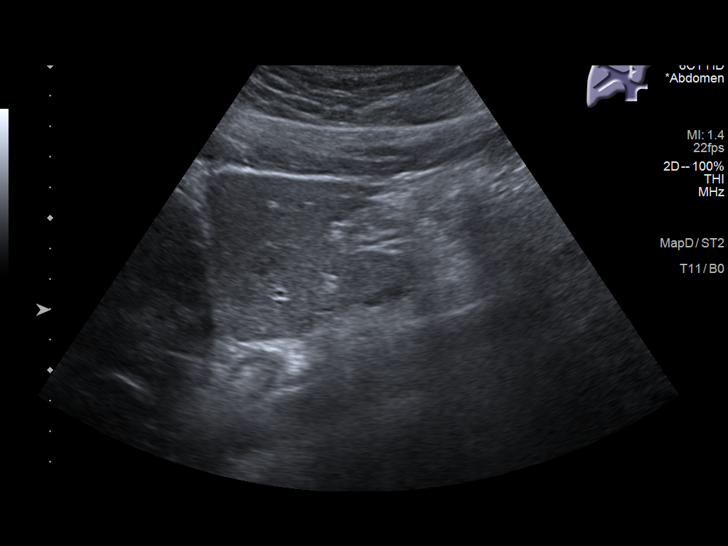
[im 31/49]
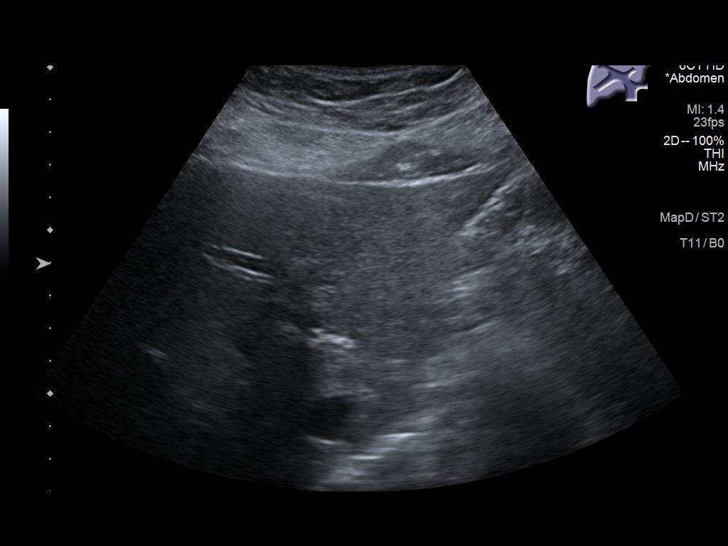
[im 33/49]
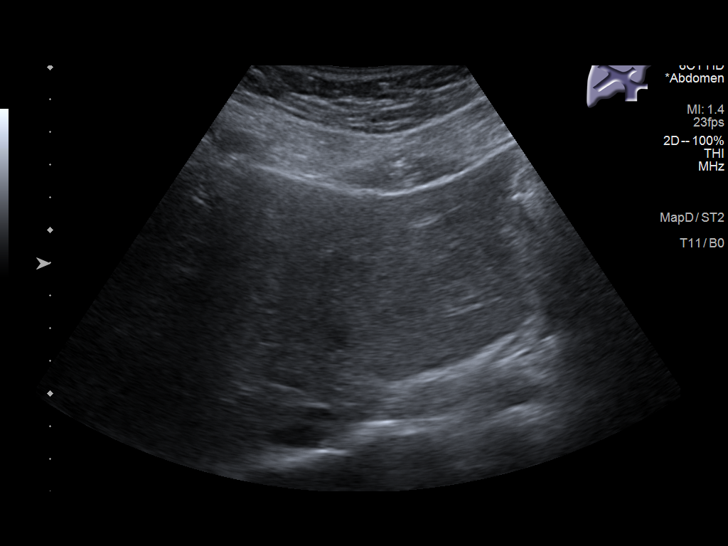
[im 37/49]
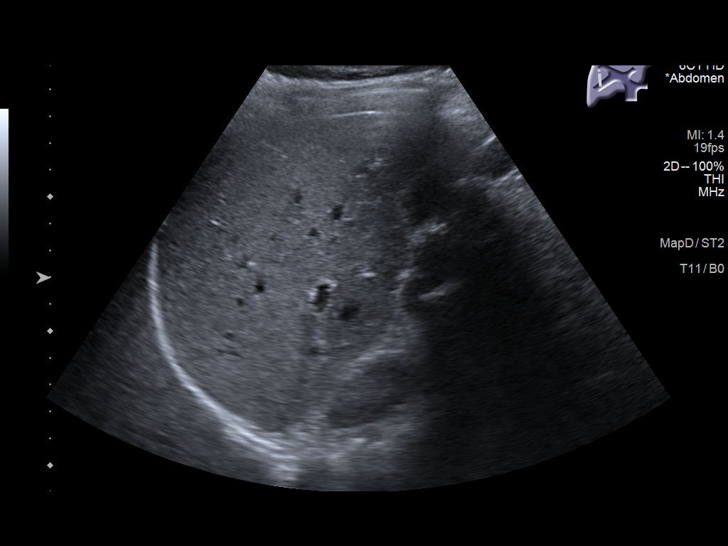
[im 41/49]
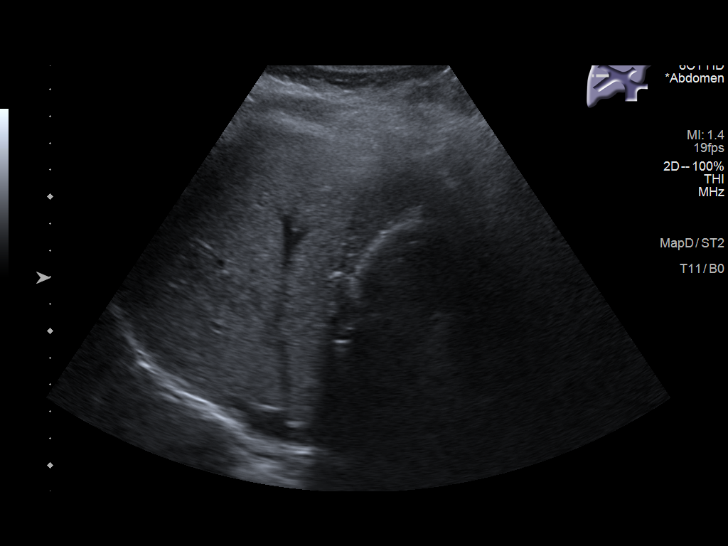
[im 45/49]
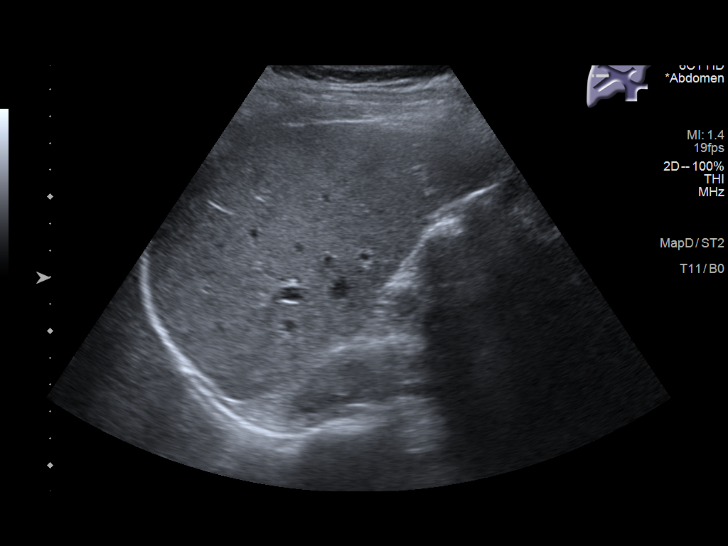
[im 49/49]
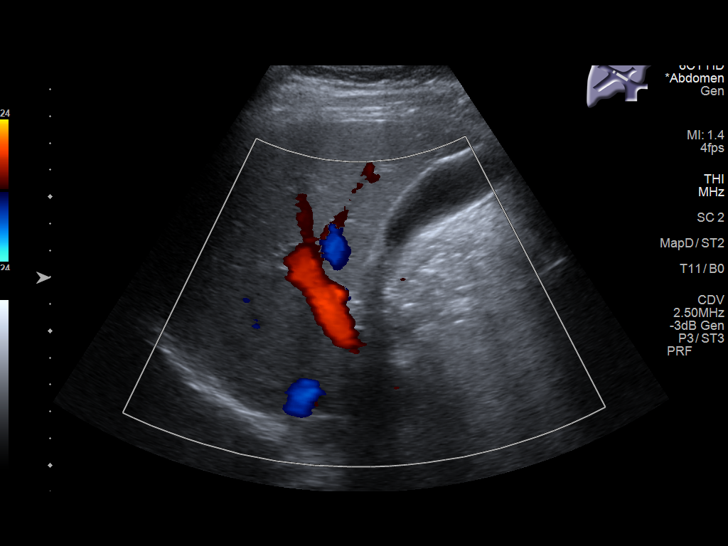

[14 of 25 positions shown; findings below may reference images not displayed]

FINDINGS: Gallbladder:

No gallstones or wall thickening visualized. No sonographic Murphy
sign noted by sonographer.

Common bile duct:

Diameter: 0.3 cm, within normal limits

Liver:

No focal lesion identified. Within normal limits in parenchymal
echogenicity. Portal vein is patent on color Doppler imaging with
normal direction of blood flow towards the liver.

Other: None.
IMPRESSION: No sonographic finding in the right upper quadrant to explain the
patient's abdominal symptoms.

## 2023-07-22 ENCOUNTER — Encounter: Payer: Self-pay | Admitting: Internal Medicine

## 2023-07-22 ENCOUNTER — Ambulatory Visit (INDEPENDENT_AMBULATORY_CARE_PROVIDER_SITE_OTHER): Payer: BC Managed Care – PPO | Admitting: Internal Medicine

## 2023-07-22 VITALS — BP 112/70 | HR 99 | Temp 97.1°F | Ht 62.0 in | Wt 193.0 lb

## 2023-07-22 DIAGNOSIS — J029 Acute pharyngitis, unspecified: Secondary | ICD-10-CM | POA: Diagnosis not present

## 2023-07-22 DIAGNOSIS — J020 Streptococcal pharyngitis: Secondary | ICD-10-CM

## 2023-07-22 LAB — POCT INFLUENZA A/B
Influenza A, POC: NEGATIVE
Influenza B, POC: NEGATIVE

## 2023-07-22 LAB — POC COVID19 BINAXNOW: SARS Coronavirus 2 Ag: NEGATIVE

## 2023-07-22 LAB — POCT RAPID STREP A (OFFICE): Rapid Strep A Screen: POSITIVE — AB

## 2023-07-22 MED ORDER — AZITHROMYCIN 250 MG PO TABS: ORAL_TABLET | ORAL | 0 refills | Status: DC

## 2023-07-22 NOTE — Assessment & Plan Note (Signed)
COVID and flu negative Strep clearly positive Discussed analgesics Z-pak RTW tomorrow or 9/18

## 2023-07-22 NOTE — Progress Notes (Signed)
Subjective:    Patient ID: Rebecca Rollins, female    DOB: Aug 13, 1991, 32 y.o.   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 30 tablet 1   No current facility-administered medications on file prior to visit.    Allergies  Allergen Reactions   Penicillins     REACTION: ? Rash    Past Medical History:  Diagnosis Date   Reflux     Past Surgical History:  Procedure Laterality Date   MOUTH SURGERY  2011    Family History  Problem Relation Age of Onset   Hypertension Mother    Anemia Mother    Diabetes Maternal Uncle    Hypertension Maternal Grandmother    Hypertension Maternal Grandfather     Social History   Socioeconomic History   Marital status: Single    Spouse name: Not on file   Number of children: Not on file   Years of education: Not on file   Highest education level: Not on file  Occupational History   Not on file  Tobacco Use   Smoking status: Never   Smokeless tobacco: Never  Vaping Use   Vaping status: Never Used  Substance and Sexual Activity   Alcohol use: Yes    Alcohol/week: 0.0 standard drinks of alcohol    Comment: occass   Drug use: No   Sexual activity: Not Currently    Birth control/protection: I.U.D.  Other Topics Concern   Not on file  Social History Narrative   11th grade at [employer]   As and Bs   Plans on college   Diet: some fruits and veggies   No sports   Works at [employer]   Exercise: only in gym   [employer] and phone    Sexually active...first at 25   Social Determinants of Health   Financial Resource Strain: Not on file  Food Insecurity: Not on file  Transportation Needs: Not on file  Physical Activity: Not on file  Stress: Not on file  Social Connections: Not on file  Intimate Partner Violence: Not on file   Review of Systems No change in smell or taste Some nausea--but no vomiting Not much appetite--but is drinking (some pain with swallowing)    Objective:   Physical Exam Constitutional:      Appearance: Normal appearance.  HENT:     Head:     Comments: No sinus tenderness    Right Ear: Tympanic membrane and ear canal normal.     Left Ear: Tympanic membrane and ear canal normal.     Ears:     Comments: Cerumen on right ---near total occlusion ~50% on left    Mouth/Throat:     Comments: Mild injection without tonsillar enlargement or exudate Pulmonary:     Effort: Pulmonary effort is normal.     Breath sounds: Normal breath sounds. No wheezing or rales.  Musculoskeletal:     Cervical back: Neck supple.  Lymphadenopathy:     Cervical: No cervical adenopathy.  Neurological:  Mental Status: She is alert.            Assessment & Plan:

## 2023-07-22 NOTE — Addendum Note (Signed)
Addended by: Eual Fines on: 07/22/2023 11:48 AM   Modules accepted: Orders

## 2023-07-26 ENCOUNTER — Other Ambulatory Visit: Payer: Self-pay | Admitting: Certified Nurse Midwife

## 2023-07-26 DIAGNOSIS — B009 Herpesviral infection, unspecified: Secondary | ICD-10-CM

## 2023-08-23 ENCOUNTER — Ambulatory Visit: Payer: BC Managed Care – PPO | Admitting: Family Medicine

## 2023-08-23 ENCOUNTER — Encounter: Payer: Self-pay | Admitting: Family Medicine

## 2023-08-23 VITALS — BP 108/64 | HR 85 | Temp 98.3°F | Ht 62.0 in | Wt 195.2 lb

## 2023-08-23 DIAGNOSIS — F411 Generalized anxiety disorder: Secondary | ICD-10-CM

## 2023-08-23 DIAGNOSIS — H01139 Eczematous dermatitis of unspecified eye, unspecified eyelid: Secondary | ICD-10-CM | POA: Insufficient documentation

## 2023-08-23 DIAGNOSIS — H1013 Acute atopic conjunctivitis, bilateral: Secondary | ICD-10-CM | POA: Insufficient documentation

## 2023-08-23 DIAGNOSIS — J302 Other seasonal allergic rhinitis: Secondary | ICD-10-CM | POA: Diagnosis not present

## 2023-08-23 DIAGNOSIS — A630 Anogenital (venereal) warts: Secondary | ICD-10-CM

## 2023-08-23 DIAGNOSIS — F321 Major depressive disorder, single episode, moderate: Secondary | ICD-10-CM

## 2023-08-23 MED ORDER — AZELASTINE HCL 0.05 % OP SOLN: 1.0000 [drp] | Freq: Two times a day (BID) | OPHTHALMIC | 12 refills | Status: DC

## 2023-08-23 MED ORDER — VENLAFAXINE HCL ER 37.5 MG PO CP24: 37.5000 mg | ORAL_CAPSULE | Freq: Every day | ORAL | 1 refills | Status: DC

## 2023-08-23 MED ORDER — TRIAMCINOLONE ACETONIDE 0.1 % EX CREA: 1.0000 | TOPICAL_CREAM | Freq: Two times a day (BID) | CUTANEOUS | 0 refills | Status: AC

## 2023-08-23 NOTE — Assessment & Plan Note (Signed)
Red irritated skin around eyes, no clear sign of cellulitis.  Most likely secondary to allergic reaction, eczema etc. Will treat with topical triamcinolone cream 0.1% twice daily as needed for the next 2 weeks.

## 2023-08-23 NOTE — Assessment & Plan Note (Signed)
Acute worsening of chronic issue.  Will treat as above.

## 2023-08-23 NOTE — Patient Instructions (Signed)
Start venlafaxine 37.5 mg daily, then after 1 week increase to 2 capsules daily.  Call if interested in referral to counseling. Started zyrtec at bedtime.  Apply topical steroid to upper and lower lid.  Start topical eye drops for allergic conjunctivitis as well.

## 2023-08-23 NOTE — Assessment & Plan Note (Signed)
Acute worsening of chronic issue.  Recommended reconsideration of counseling.  We will restart venlafaxine 37.5 mg daily and increase after 1 week to 75 mg daily.  She will follow-up reevaluation.

## 2023-08-23 NOTE — Assessment & Plan Note (Signed)
Treat with Zyrtec 10 mg p.o. nightly.  Can use Flonase 2 sprays per nostril daily for nasal symptoms if they increase. Will also treat with Optivar eyedrops to help with conjunctivitis.

## 2023-08-23 NOTE — Progress Notes (Signed)
Patient ID: Rebecca Rollins, female    DOB: 05/31/1991, 32 y.o.   MRN: [id number]  This visit was conducted in person.  BP 108/64 (BP Location: Left Arm, Patient Position: Sitting, Cuff Size: Large)   Pulse 85   Temp 98.3 F (36.8 C) (Temporal)   Ht 5\' 2"  (1.575 m)   Wt 195 lb 4 oz (88.6 kg)   LMP 07/29/2023 (Approximate)   SpO2 98%   BMI 35.71 kg/m    CC:  Chief Complaint  Patient presents with   Itchy Eyes    Swollen in morning when she wakes up   Depression    Subjective:   HPI: Rebecca Rollins is a 32 y.o. female presenting on 08/23/2023 for Itchy Eyes (Swollen in morning when she wakes up) and Depression   New onset itchy eyes,  gritty eyes, swelling and clear, crusty discharge in the morning when she wakes up. Burning. No eye redness.  Ongoing 2 weeks, worse in last week.  No sneeze, some nasal congestion.   Major depressive disorder and generalized anxiety: Not currently on medication.  She reports worsening of depression in  last 6-9 months.  Has seen counselor in past, but not recently.  Tried venlafaxine in past.. stopped when was feeling better.'  No SI.     08/23/2023    2:58 PM 12/01/2021    2:33 PM  GAD 7 : Generalized Anxiety Score  Nervous, Anxious, on Edge 1 2  Control/stop worrying 2 2  Worry too much - different things 2 3  Trouble relaxing 1 2  Restless 1 2  Easily annoyed or irritable 2 3  Afraid - awful might happen 1 2  Total GAD 7 Score 10 16  Anxiety Difficulty Somewhat difficult Extremely difficult       08/23/2023    2:57 PM 12/01/2021    2:32 PM 12/04/2019    1:03 PM 03/10/2018    3:04 PM  Depression screen PHQ 2/9  Decreased Interest 1 2 1 1   Down, Depressed, Hopeless 1 2 1 1   PHQ - 2 Score 2 4 2 2   Altered sleeping 2 2 3  0  Tired, decreased energy 2 2 2 1   Change in appetite 1 3 3 2   Feeling bad or failure about yourself  2 3 3 2   Trouble concentrating 1 2 3  0  Moving slowly or fidgety/restless 1 2 3 1   Suicidal thoughts  0 1 1 0  PHQ-9 Score 11 19 20 8   Difficult doing work/chores Somewhat difficult Extremely dIfficult Very difficult           Relevant past medical, surgical, family and social history reviewed and updated as indicated. Interim medical history since our last visit reviewed. Allergies and medications reviewed and updated. Outpatient Medications Prior to Visit  Medication Sig Dispense Refill   pantoprazole (PROTONIX) 40 MG tablet Take 1 tablet (40 mg total) by mouth daily. 30 tablet 1   valACYclovir (VALTREX) 1000 MG tablet TAKE 1 TABLET BY MOUTH AS NEEDED FOR  UP  TO  1  DAY 30 tablet 0   azithromycin (ZITHROMAX Z-PAK) 250 MG tablet Take 2 tablets (500 mg) on  Day 1,  followed by 1 tablet (250 mg) once daily on Days 2 through 5. 6 each 0   No facility-administered medications prior to visit.     Per HPI unless specifically indicated in ROS section below Review of Systems  Constitutional:  Negative for fatigue and fever.  HENT:  Negative for congestion.   Eyes:  Negative for pain.  Respiratory:  Negative for cough and shortness of breath.   Cardiovascular:  Negative for chest pain, palpitations and leg swelling.  Gastrointestinal:  Negative for abdominal pain.  Genitourinary:  Negative for dysuria and vaginal bleeding.  Musculoskeletal:  Negative for back pain.  Neurological:  Negative for syncope, light-headedness and headaches.  Psychiatric/Behavioral:  Negative for dysphoric mood.    Objective:  BP 108/64 (BP Location: Left Arm, Patient Position: Sitting, Cuff Size: Large)   Pulse 85   Temp 98.3 F (36.8 C) (Temporal)   Ht 5\' 2"  (1.575 m)   Wt 195 lb 4 oz (88.6 kg)   LMP 07/29/2023 (Approximate)   SpO2 98%   BMI 35.71 kg/m   Wt Readings from Last 3 Encounters:  08/23/23 195 lb 4 oz (88.6 kg)  07/22/23 193 lb (87.5 kg)  08/21/22 206 lb 1.6 oz (93.5 kg)      Physical Exam Constitutional:      General: She is not in acute distress.    Appearance: Normal appearance.  She is well-developed. She is not ill-appearing or toxic-appearing.  HENT:     Head: Normocephalic.     Right Ear: Hearing, tympanic membrane, ear canal and external ear normal. Tympanic membrane is not erythematous, retracted or bulging.     Left Ear: Hearing, tympanic membrane, ear canal and external ear normal. Tympanic membrane is not erythematous, retracted or bulging.     Nose: No mucosal edema or rhinorrhea.     Right Turbinates: Swollen and pale.     Left Turbinates: Swollen and pale.     Right Sinus: No maxillary sinus tenderness or frontal sinus tenderness.     Left Sinus: No maxillary sinus tenderness or frontal sinus tenderness.     Mouth/Throat:     Mouth: Oropharynx is clear and moist and mucous membranes are normal.     Pharynx: Uvula midline.  Eyes:     General: Lids are normal. Lids are everted, no foreign bodies appreciated. Allergic shiner present.        Right eye: No discharge.        Left eye: No discharge.     Extraocular Movements: Extraocular movements intact and EOM normal.     Conjunctiva/sclera: Conjunctivae normal.     Right eye: Right conjunctiva is not injected.     Left eye: Left conjunctiva is not injected.     Pupils: Pupils are equal, round, and reactive to light.     Comments: Erythema of bilateral upper eyelids and at edge of lower eyelids  Neck:     Thyroid: No thyroid mass or thyromegaly.     Vascular: No carotid bruit.     Trachea: Trachea normal.  Cardiovascular:     Rate and Rhythm: Normal rate and regular rhythm.     Pulses: Normal pulses.     Heart sounds: Normal heart sounds, S1 normal and S2 normal. No murmur heard.    No friction rub. No gallop.  Pulmonary:     Effort: Pulmonary effort is normal. No tachypnea or respiratory distress.     Breath sounds: Normal breath sounds. No decreased breath sounds, wheezing, rhonchi or rales.  Abdominal:     General: Bowel sounds are normal.     Palpations: Abdomen is soft.     Tenderness: There  is no abdominal tenderness.  Musculoskeletal:     Cervical back: Normal range of motion and neck supple.  Skin:  General: Skin is warm, dry and intact.     Findings: No rash.  Neurological:     Mental Status: She is alert.  Psychiatric:        Mood and Affect: Mood is not anxious or depressed.        Speech: Speech normal.        Behavior: Behavior normal. Behavior is cooperative.        Thought Content: Thought content normal.        Cognition and Memory: Cognition and memory normal.        Judgment: Judgment normal.       Results for orders placed or performed in visit on 07/22/23  POC COVID-19  Result Value Ref Range   SARS Coronavirus 2 Ag Negative Negative  Rapid Strep A  Result Value Ref Range   Rapid Strep A Screen Positive (A) Negative  Influenza A/B  Result Value Ref Range   Influenza A, POC Negative Negative   Influenza B, POC Negative Negative    Assessment and Plan  Current moderate episode of major depressive disorder without prior episode (HCC) Assessment & Plan: Acute worsening of chronic issue.  Recommended reconsideration of counseling.  We will restart venlafaxine 37.5 mg daily and increase after 1 week to 75 mg daily.  She will follow-up reevaluation.   GAD (generalized anxiety disorder) Assessment & Plan: Acute worsening of chronic issue.  Will treat as above.   HPV (human papilloma virus) anogenital infection  Seasonal allergic rhinitis, unspecified trigger  Allergic conjunctivitis of both eyes Assessment & Plan: Treat with Zyrtec 10 mg p.o. nightly.  Can use Flonase 2 sprays per nostril daily for nasal symptoms if they increase. Will also treat with Optivar eyedrops to help with conjunctivitis.   Eczema of eyelid, unspecified laterality Assessment & Plan: Red irritated skin around eyes, no clear sign of cellulitis.  Most likely secondary to allergic reaction, eczema etc. Will treat with topical triamcinolone cream 0.1% twice daily as  needed for the next 2 weeks.   Other orders -     Venlafaxine HCl ER; Take 1 capsule (37.5 mg total) by mouth daily with breakfast.  Dispense: 60 capsule; Refill: 1 -     Azelastine HCl; Place 1 drop into both eyes 2 (two) times daily.  Dispense: 6 mL; Refill: 12 -     Triamcinolone Acetonide; Apply 1 Application topically 2 (two) times daily.  Dispense: 30 g; Refill: 0    Return in about 4 weeks (around 09/20/2023) for follow up mood.   Kerby Nora, MD

## 2023-09-23 ENCOUNTER — Encounter: Payer: Self-pay | Admitting: Family Medicine

## 2023-09-25 ENCOUNTER — Telehealth: Payer: Self-pay | Admitting: Family Medicine

## 2023-09-25 NOTE — Telephone Encounter (Signed)
Patient called in to ask about medication venlafaxine XR (EFFEXOR XR) 37.5 MG 24 hr capsule . She said that in the last  month she has been experiencing bumps on her legs that look like mosquito bites,when theres no mosquitos out. She said that the bumps are causing bruising as well.She said the only thing that has changed is her starting to take this medication,so she is wondering could this be a side effect from the medication?

## 2023-09-26 NOTE — Telephone Encounter (Signed)
This is not likely a side effect of the medication. Potentially if there were lots of bumps or hives all over this could reflect an allergic reaction?  Have her make an appointment if this continues for further evaluation.

## 2023-09-26 NOTE — Telephone Encounter (Signed)
Ashtan notified as instructed by telephone.  Scheduled appointment with Dr. Patsy Lager 10/02/2023 at 10:40 am per patient request.

## 2023-10-02 ENCOUNTER — Ambulatory Visit: Payer: BC Managed Care – PPO | Admitting: Family Medicine

## 2023-10-25 ENCOUNTER — Other Ambulatory Visit: Payer: Self-pay | Admitting: Family Medicine

## 2023-10-25 NOTE — Telephone Encounter (Signed)
Spoke to pt, scheduled f/u on 11/07/23

## 2023-10-25 NOTE — Telephone Encounter (Signed)
Please schedule follow up mood with Dr. Ermalene Searing.

## 2023-11-07 ENCOUNTER — Ambulatory Visit (INDEPENDENT_AMBULATORY_CARE_PROVIDER_SITE_OTHER): Payer: 59 | Admitting: Family Medicine

## 2023-11-07 ENCOUNTER — Encounter: Payer: Self-pay | Admitting: Family Medicine

## 2023-11-07 VITALS — BP 118/66 | HR 73 | Temp 97.4°F | Ht 62.0 in | Wt 198.0 lb

## 2023-11-07 DIAGNOSIS — F411 Generalized anxiety disorder: Secondary | ICD-10-CM

## 2023-11-07 DIAGNOSIS — N920 Excessive and frequent menstruation with regular cycle: Secondary | ICD-10-CM | POA: Diagnosis not present

## 2023-11-07 DIAGNOSIS — F321 Major depressive disorder, single episode, moderate: Secondary | ICD-10-CM

## 2023-11-07 MED ORDER — VENLAFAXINE HCL ER 150 MG PO CP24: 150.0000 mg | ORAL_CAPSULE | Freq: Every day | ORAL | 4 refills | Status: DC

## 2023-11-07 NOTE — Assessment & Plan Note (Signed)
 Chronic, worsening She states there is a hereditary element to heavy menstrual cycles but is unaware of any bleeding issue.  She denies other easy bleeding or bruising. She had an ultrasound in the past showing no clear sign of structural abnormality. Given menstrual cycle is regular but heavy, hormonal source less likely.  In the past she did have slightly improved menorrhagia on OCPs but she had intolerable side effects and does not wish to restart.  Will evaluate hemoglobin, iron  levels as well as evaluate for bleeding issue including von Willebrand's. Recommend she return to see gynecologist for possible repeat ultrasound or more definitive treatment of her heavy menstrual cycles.  She does note she is note 100% sure she is done with having children.

## 2023-11-07 NOTE — Progress Notes (Signed)
 Patient ID: Rebecca Rollins, female    DOB: 03/15/1991, 33 y.o.   MRN: [id number]  This visit was conducted in person.  BP 118/66   Pulse 73   Temp (!) 97.4 F (36.3 C) (Temporal)   Ht 5' 2 (1.575 m)   Wt 198 lb (89.8 kg)   LMP 10/12/2023   SpO2 98%   BMI 36.21 kg/m    CC:  Chief Complaint  Patient presents with   Medical Management of Chronic Issues    Depression f/u    Subjective:   HPI: Rebecca Rollins is a 33 y.o. female presenting on 11/07/2023 for Medical Management of Chronic Issues (Depression f/u)  Major depressive disorder and generalized anxiety:At last OV:  She reported worsening of depression in  last 6-9 months. Restarted venlafaxine  37.5 mg daily.. she has noted slight improvement her depression/ anxiety. Now 75 mg  daily in last 1.5 months.  Had some nausea and HA for 1 week.. resolved  Some mild fatigue.  She has had 65% improvement in mood overall.   Also noting very heavy menses.SABRA  last 4-5 days. HAs noted in last year.  On heavy days notes large blood clots on heavy days.  Sometimes tired, dizzy. No SOB, no HA.  In past has seen [facility] in past.  US  in 2020 Cyst  Mom and MGM with heavy menses as well, hysterectomy.. no known bleeding disorder.   Had SE to OCPs.     11/07/2023    3:03 PM 08/23/2023    2:58 PM 12/01/2021    2:33 PM  GAD 7 : Generalized Anxiety Score  Nervous, Anxious, on Edge 1 1 2   Control/stop worrying 1 2 2   Worry too much - different things 1 2 3   Trouble relaxing 1 1 2   Restless 1 1 2   Easily annoyed or irritable 2 2 3   Afraid - awful might happen 1 1 2   Total GAD 7 Score 8 10 16   Anxiety Difficulty Somewhat difficult Somewhat difficult Extremely difficult       11/07/2023    3:03 PM 08/23/2023    2:57 PM 12/01/2021    2:32 PM 12/04/2019    1:03 PM 03/10/2018    3:04 PM  Depression screen PHQ 2/9  Decreased Interest 1 1 2 1 1   Down, Depressed, Hopeless 1 1 2 1 1   PHQ - 2 Score 2 2 4 2 2   Altered sleeping 2 2  2 3  0  Tired, decreased energy 2 2 2 2 1   Change in appetite 0 1 3 3 2   Feeling bad or failure about yourself  1 2 3 3 2   Trouble concentrating 1 1 2 3  0  Moving slowly or fidgety/restless 0 1 2 3 1   Suicidal thoughts 0 0 1 1 0  PHQ-9 Score 8 11 19 20 8   Difficult doing work/chores Somewhat difficult Somewhat difficult Extremely dIfficult Very difficult           Relevant past medical, surgical, family and social history reviewed and updated as indicated. Interim medical history since our last visit reviewed. Allergies and medications reviewed and updated. Outpatient Medications Prior to Visit  Medication Sig Dispense Refill   pantoprazole  (PROTONIX ) 40 MG tablet Take 1 tablet (40 mg total) by mouth daily. 30 tablet 1   triamcinolone  cream (KENALOG ) 0.1 % Apply 1 Application topically 2 (two) times daily. 30 g 0   valACYclovir  (VALTREX ) 1000 MG tablet TAKE 1 TABLET BY MOUTH  AS NEEDED FOR  UP  TO  1  DAY 30 tablet 0   venlafaxine  XR (EFFEXOR -XR) 37.5 MG 24 hr capsule TAKE 1 CAPSULE BY MOUTH ONCE DAILY WITH BREAKFAST 30 capsule 0   azelastine  (OPTIVAR ) 0.05 % ophthalmic solution Place 1 drop into both eyes 2 (two) times daily. (Patient not taking: Reported on 11/07/2023) 6 mL 12   No facility-administered medications prior to visit.     Per HPI unless specifically indicated in ROS section below Review of Systems  Constitutional:  Negative for fatigue and fever.  HENT:  Negative for congestion.   Eyes:  Negative for pain.  Respiratory:  Negative for cough and shortness of breath.   Cardiovascular:  Negative for chest pain, palpitations and leg swelling.  Gastrointestinal:  Negative for abdominal pain.  Genitourinary:  Negative for dysuria and vaginal bleeding.  Musculoskeletal:  Negative for back pain.  Neurological:  Negative for syncope, light-headedness and headaches.  Psychiatric/Behavioral:  Negative for dysphoric mood.    Objective:  BP 118/66   Pulse 73   Temp (!) 97.4 F  (36.3 C) (Temporal)   Ht 5' 2 (1.575 m)   Wt 198 lb (89.8 kg)   LMP 10/12/2023   SpO2 98%   BMI 36.21 kg/m   Wt Readings from Last 3 Encounters:  11/07/23 198 lb (89.8 kg)  08/23/23 195 lb 4 oz (88.6 kg)  07/22/23 193 lb (87.5 kg)      Physical Exam Constitutional:      General: She is not in acute distress.    Appearance: Normal appearance. She is well-developed. She is not ill-appearing or toxic-appearing.  HENT:     Head: Normocephalic.     Right Ear: Hearing, tympanic membrane, ear canal and external ear normal. Tympanic membrane is not erythematous, retracted or bulging.     Left Ear: Hearing, tympanic membrane, ear canal and external ear normal. Tympanic membrane is not erythematous, retracted or bulging.     Nose: No mucosal edema or rhinorrhea.     Right Turbinates: Swollen and pale.     Left Turbinates: Swollen and pale.     Right Sinus: No maxillary sinus tenderness or frontal sinus tenderness.     Left Sinus: No maxillary sinus tenderness or frontal sinus tenderness.     Mouth/Throat:     Pharynx: Uvula midline.  Eyes:     General: Lids are normal. Lids are everted, no foreign bodies appreciated. Allergic shiner present.        Right eye: No discharge.        Left eye: No discharge.     Extraocular Movements: Extraocular movements intact.     Conjunctiva/sclera: Conjunctivae normal.     Right eye: Right conjunctiva is not injected.     Left eye: Left conjunctiva is not injected.     Pupils: Pupils are equal, round, and reactive to light.     Comments: Erythema of bilateral upper eyelids and at edge of lower eyelids  Neck:     Thyroid : No thyroid  mass or thyromegaly.     Vascular: No carotid bruit.     Trachea: Trachea normal.  Cardiovascular:     Rate and Rhythm: Normal rate and regular rhythm.     Pulses: Normal pulses.     Heart sounds: Normal heart sounds, S1 normal and S2 normal. No murmur heard.    No friction rub. No gallop.  Pulmonary:     Effort:  Pulmonary effort is normal. No tachypnea or respiratory  distress.     Breath sounds: Normal breath sounds. No decreased breath sounds, wheezing, rhonchi or rales.  Abdominal:     General: Bowel sounds are normal.     Palpations: Abdomen is soft.     Tenderness: There is no abdominal tenderness.  Musculoskeletal:     Cervical back: Normal range of motion and neck supple.  Skin:    General: Skin is warm and dry.     Findings: No rash.  Neurological:     Mental Status: She is alert.  Psychiatric:        Mood and Affect: Mood is not anxious or depressed.        Speech: Speech normal.        Behavior: Behavior normal. Behavior is cooperative.        Thought Content: Thought content normal.        Judgment: Judgment normal.       Results for orders placed or performed in visit on 07/22/23  Influenza A/B   Collection Time: 07/22/23 11:47 AM  Result Value Ref Range   Influenza A, POC Negative Negative   Influenza B, POC Negative Negative  POC COVID-19   Collection Time: 07/22/23 11:48 AM  Result Value Ref Range   SARS Coronavirus 2 Ag Negative Negative  Rapid Strep A   Collection Time: 07/22/23 11:48 AM  Result Value Ref Range   Rapid Strep A Screen Positive (A) Negative    Assessment and Plan  Current moderate episode of major depressive disorder without prior episode (HCC) Assessment & Plan: Chronic, improved 65% improvement in mood overall but still room for improvement.  Will increase venlafaxine  to 150 mg daily.  She will move forward with referral to psychology.  She will follow-up in 4 to 6 weeks for reevaluation.  Orders: -     Ambulatory referral to Psychology  GAD (generalized anxiety disorder) -     Ambulatory referral to Psychology  Menorrhagia with regular cycle Assessment & Plan: Chronic, worsening She states there is a hereditary element to heavy menstrual cycles but is unaware of any bleeding issue.  She denies other easy bleeding or bruising. She had  an ultrasound in the past showing no clear sign of structural abnormality. Given menstrual cycle is regular but heavy, hormonal source less likely.  In the past she did have slightly improved menorrhagia on OCPs but she had intolerable side effects and does not wish to restart.  Will evaluate hemoglobin, iron  levels as well as evaluate for bleeding issue including von Willebrand's. Recommend she return to see gynecologist for possible repeat ultrasound or more definitive treatment of her heavy menstrual cycles.  She does note she is note 100% sure she is done with having children.  Orders: -     CBC with Differential/Platelet -     TSH -     Protime-INR -     APTT -     Von Willebrand panel -     IBC + Ferritin  Other orders -     Venlafaxine  HCl ER; Take 1 capsule (150 mg total) by mouth daily with breakfast.  Dispense: 30 capsule; Refill: 4     Return in about 6 weeks (around 12/19/2023) for  follow up mood ( virtual okay).   Greig Ring, MD

## 2023-11-07 NOTE — Assessment & Plan Note (Signed)
 Chronic, improved 65% improvement in mood overall but still room for improvement.  Will increase venlafaxine to 150 mg daily.  She will move forward with referral to psychology.  She will follow-up in 4 to 6 weeks for reevaluation.

## 2023-11-08 LAB — CBC WITH DIFFERENTIAL/PLATELET
Basophils Absolute: 0 10*3/uL (ref 0.0–0.1)
Basophils Relative: 0.6 % (ref 0.0–3.0)
Eosinophils Absolute: 0.2 10*3/uL (ref 0.0–0.7)
Eosinophils Relative: 3.4 % (ref 0.0–5.0)
HCT: 34.8 % — ABNORMAL LOW (ref 36.0–46.0)
Hemoglobin: 11 g/dL — ABNORMAL LOW (ref 12.0–15.0)
Lymphocytes Relative: 40.7 % (ref 12.0–46.0)
Lymphs Abs: 2.1 10*3/uL (ref 0.7–4.0)
MCHC: 31.7 g/dL (ref 30.0–36.0)
MCV: 81.7 fL (ref 78.0–100.0)
Monocytes Absolute: 0.5 10*3/uL (ref 0.1–1.0)
Monocytes Relative: 9.9 % (ref 3.0–12.0)
Neutro Abs: 2.3 10*3/uL (ref 1.4–7.7)
Neutrophils Relative %: 45.4 % (ref 43.0–77.0)
Platelets: 355 10*3/uL (ref 150.0–400.0)
RBC: 4.26 Mil/uL (ref 3.87–5.11)
RDW: 15.7 % — ABNORMAL HIGH (ref 11.5–15.5)
WBC: 5.1 10*3/uL (ref 4.0–10.5)

## 2023-11-08 LAB — IBC + FERRITIN
Ferritin: 7.4 ng/mL — ABNORMAL LOW (ref 10.0–291.0)
Iron: 34 ug/dL — ABNORMAL LOW (ref 42–145)
Saturation Ratios: 7.8 % — ABNORMAL LOW (ref 20.0–50.0)
TIBC: 436.8 ug/dL (ref 250.0–450.0)
Transferrin: 312 mg/dL (ref 212.0–360.0)

## 2023-11-08 LAB — PROTIME-INR
INR: 1.1 {ratio} — ABNORMAL HIGH (ref 0.8–1.0)
Prothrombin Time: 12.1 s (ref 9.6–13.1)

## 2023-11-08 LAB — APTT: aPTT: 30.2 s (ref 25.4–36.8)

## 2023-11-08 LAB — TSH: TSH: 0.87 u[IU]/mL (ref 0.35–5.50)

## 2023-11-11 LAB — VON WILLEBRAND PANEL
Factor-VIII Activity: 94 %{normal} (ref 50–180)
Ristocetin Co-Factor: 61 %{normal} (ref 42–200)
Von Willebrand Antigen, Plasma: 76 % (ref 50–217)
aPTT: 29 s (ref 23–32)

## 2023-12-16 ENCOUNTER — Ambulatory Visit: Payer: 59 | Admitting: Licensed Clinical Social Worker

## 2023-12-16 DIAGNOSIS — F329 Major depressive disorder, single episode, unspecified: Secondary | ICD-10-CM

## 2023-12-16 DIAGNOSIS — F419 Anxiety disorder, unspecified: Secondary | ICD-10-CM

## 2023-12-16 DIAGNOSIS — F418 Other specified anxiety disorders: Secondary | ICD-10-CM
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
## 2023-12-23 ENCOUNTER — Ambulatory Visit: Payer: 59 | Admitting: Licensed Clinical Social Worker

## 2023-12-23 DIAGNOSIS — F329 Major depressive disorder, single episode, unspecified: Secondary | ICD-10-CM | POA: Diagnosis not present

## 2023-12-23 DIAGNOSIS — F419 Anxiety disorder, unspecified: Secondary | ICD-10-CM

## 2023-12-23 NOTE — Progress Notes (Signed)
 [facility] Counselor/Therapist Progress Note  Patient ID: DEZAREE TRACEY, MRN: [id number]   Date: 12/23/23  Time Spent: 12:04  pm - 1:00 pm : 56 Minutes  Treatment Type: Individual Therapy.  Reported Symptoms: lonely, vulnerable, disappointed, embarrassed, ashamed, abandoned, frustrated, overwhelmed, stressed, and unfocused  Mental Status Exam: Appearance:  Neat     Behavior: Appropriate  Motor: Normal  Speech/Language:  Normal Rate  Affect: Appropriate  Mood: normal  Thought process: normal  Thought content:   WNL  Sensory/Perceptual disturbances:   WNL  Orientation: oriented to person, place, and time/date  Attention: Good  Concentration: Good  Memory: WNL  Fund of knowledge:  Good  Insight:   Good  Judgment:  Good  Impulse Control: Good   Risk Assessment: Danger to Self:  No Self-injurious Behavior: No Danger to Others: No Duty to Warn:no Physical Aggression / Violence:No  Access to Firearms a concern: No  Gang Involvement:No   Subjective:   [name] participated from home, via video and consented to treatment. Therapist participated from home office. I discussed the limitations of evaluation and management by telemedicine and the availability of in person appointments. The patient expressed understanding and agreed to proceed. Deysha reviewed the events of the past week.      We reviewed numerous treatment approaches including CBT and Solution focused therapy. Psych-education regarding the Aamina's diagnosis of No diagnosis found. was provided during the session. We discussed Shainna L Karp's goals treatment goals which include identifying, verbalize sources of anxiety, fears, or concerns.[name] provided verbal approval of the treatment plan.   Interventions: Psycho-education & Goal Setting.   Diagnosis:  Anxiety, Reactive Depression   Psychiatric Treatment: Yes , N/A   Treatment Plan:  Client Abilities/Strengths Rafaela is open  and ready to start her journey for self care  Support System: Sister and Best friend  Client Treatment Preferences Virtual  Treatment Level Weekly  Symptoms  Resentful   (Status: maintained) Inadequate   (Status: maintained)  Goals:   Bona agreed to set goal of  identifying, verbalize sources of anxiety, fears, or concerns.    Treatment plan signed and available on s-drive:  Yes    Target Date: 02/03/24 Frequency: Weekly  Progress: 0 Modality: individual    Therapist will provide referrals for additional resources as appropriate.  Therapist will provide psycho-education regarding Takoya's diagnosis and corresponding treatment approaches and interventions. Licensed Clinical Mental Health Counselor, Anselmo Pickler, [facility] will support the patient's ability to achieve the goals identified. will employ CBT, BA, Problem-solving, Solution Focused, Mindfulness,  coping skills, & other evidenced-based practices will be used to promote progress towards healthy functioning to help manage decrease symptoms associated with her diagnosis.   Reduce overall level, frequency, and intensity of the feelings of depression, anxiety and panic evidenced by increased overall wellness and emotional intelligence.   Verbally express understanding of the relationship between feelings of depression, anxiety and their impact on thinking patterns and behaviors. Verbalize an understanding of the role that distorted thinking plays in creating fears, excessive worry, and ruminations.    [name] participated in the creation of the treatment plan)    Anselmo Pickler, [facility]

## 2024-01-01 ENCOUNTER — Ambulatory Visit: Payer: 59 | Admitting: Clinical

## 2024-01-06 ENCOUNTER — Ambulatory Visit: Payer: 59 | Admitting: Licensed Clinical Social Worker

## 2024-01-06 DIAGNOSIS — F419 Anxiety disorder, unspecified: Secondary | ICD-10-CM | POA: Diagnosis not present

## 2024-01-06 DIAGNOSIS — F329 Major depressive disorder, single episode, unspecified: Secondary | ICD-10-CM

## 2024-01-06 NOTE — Progress Notes (Signed)
 [facility] Counselor/Therapist Progress Note  Patient ID: Rebecca Rollins, MRN: [id number]    Date: 01/06/24  Time Spent: 12:10  pm - 12:32 pm : 22 Minutes  Treatment Type: Individual Therapy.  Reported Symptoms: currently preparing to move this week with her sister  Mental Status Exam: Appearance:  Well Groomed     Behavior: Sharing  Motor: Normal  Speech/Language:  Normal Rate  Affect: Appropriate  Mood: normal  Thought process: goal directed  Thought content:   WNL  Sensory/Perceptual disturbances:   WNL  Orientation: oriented to person, place, and time/date  Attention: Good  Concentration: Good  Memory: WNL  Fund of knowledge:  Good  Insight:   Good  Judgment:  Good  Impulse Control: Good   Risk Assessment: Danger to Self:  No Self-injurious Behavior: No Danger to Others: No Duty to Warn:no Physical Aggression / Violence:No  Access to Firearms a concern: No  Gang Involvement:No   Subjective:   Rebecca Rollins participated from home, via video, and consented to treatment. I discussed the limitations of evaluation and management by telemedicine and the availability of in person appointments. The patient expressed understanding and agreed to proceed.  Therapist participated from home office.  Olisa reviewed the events of the past week.      Interventions: Solution-Oriented/Positive Psychology and Insight-Oriented  Diagnosis:  Anxiety  Psychiatric Treatment: No , N/A  Treatment Plan:  Client Abilities/Strengths Rebecca Rollins appears to be ready to start her self care journey.    Support System: Family and Friends, Sessions  Merchant navy officer of Needs Rebecca Rollins would like to  start her journey for self care    Treatment Level Weekly  Symptoms  Undetermined   (Status: maintained) Undetermined   (Status: maintained)  Goals:   Rebecca Rollins experiences symptoms of undetermined as patient lost connection and never  resumed session.     Target Date: 02/03/24 Frequency: Weekly  Progress: 0 Modality: individual    Therapist will provide referrals for additional resources as appropriate.  Therapist will provide psycho-education regarding Rebecca Rollins's diagnosis and corresponding treatment approaches and interventions. Licensed Clinical Mental Health Counselor, Anselmo Pickler, [facility] will support the patient's ability to achieve the goals identified. will employ CBT, BA, Problem-solving, Solution Focused, Mindfulness,  coping skills, & other evidenced-based practices will be used to promote progress towards healthy functioning to help manage decrease symptoms associated with her diagnosis.   Reduce overall level, frequency, and intensity of the feelings of depression, anxiety and panic evidenced by decreased  from 6 to 7 days/week to 0 to 1 days/week per client report for at least 3 consecutive months. Verbally express understanding of the relationship between feelings of depression, anxiety and their impact on thinking patterns and behaviors. Verbalize an understanding of the role that distorted thinking plays in creating fears, excessive worry, and ruminations.  [name] participated in the creation of the treatment plan)   Anselmo Pickler, [facility]

## 2024-01-23 ENCOUNTER — Encounter: Payer: Self-pay | Admitting: Family Medicine

## 2024-01-23 ENCOUNTER — Ambulatory Visit: Admitting: Family Medicine

## 2024-01-23 VITALS — BP 116/70 | HR 93 | Temp 98.7°F | Ht 62.0 in | Wt 195.1 lb

## 2024-01-23 DIAGNOSIS — R42 Dizziness and giddiness: Secondary | ICD-10-CM | POA: Diagnosis not present

## 2024-01-23 NOTE — Progress Notes (Signed)
 Patient ID: Rebecca Rollins, female    DOB: 1991/02/17, 33 y.o.   MRN: [id number]  This visit was conducted in person.  BP 116/70   Pulse 93   Temp 98.7 F (37.1 C) (Oral)   Ht 5\' 2"  (1.575 m)   Wt 195 lb 2 oz (88.5 kg)   LMP 12/29/2023   SpO2 98%   BMI 35.69 kg/m    CC:  Chief Complaint  Patient presents with   Migraine    C/o migraines and nausea.    Subjective:   HPI: Rebecca Rollins is a 33 y.o. female presenting on 01/23/2024 for Migraine (C/o migraines and nausea.)   She has no history of migraines.   She has noted dizzy spells( room spinning), lip numbness/tingling, nausea.. ongoing intermittently in last  2 months. Sometimes associated with headache.  Occurring 1-2 times a week. Lasting hours Thought could be too much coffee or SE to venlafaxine.. cut back on caffeine.   Now 2 weeks ago she had more significant symptoms.. feeling weak.. swimmy headed with standing.   Yesterday headache.. pain in posterior head on right, throbbing, occ nausea, is senstitive to light and sound.   Slight improvement with tylenol. Drinking lots of water.   Last increase in velafaxine  from 75 -150 mg. She has not noted significant improvement in mood with higher dose of venlafaxine.  Seeing a Veterinary surgeon.  11/07/2023 OV .Marland Kitchen Iron def anemia... started on iron.  PHQ9:9  GAD7:12        Relevant past medical, surgical, family and social history reviewed and updated as indicated. Interim medical history since our last visit reviewed. Allergies and medications reviewed and updated. Outpatient Medications Prior to Visit  Medication Sig Dispense Refill   pantoprazole (PROTONIX) 40 MG tablet Take 1 tablet (40 mg total) by mouth daily. 30 tablet 1   triamcinolone cream (KENALOG) 0.1 % Apply 1 Application topically 2 (two) times daily. 30 g 0   valACYclovir (VALTREX) 1000 MG tablet TAKE 1 TABLET BY MOUTH AS NEEDED FOR  UP  TO  1  DAY 30 tablet 0   venlafaxine XR (EFFEXOR-XR) 150 MG 24 hr  capsule Take 1 capsule (150 mg total) by mouth daily with breakfast. 30 capsule 4   azelastine (OPTIVAR) 0.05 % ophthalmic solution Place 1 drop into both eyes 2 (two) times daily. (Patient not taking: Reported on 11/07/2023) 6 mL 12   No facility-administered medications prior to visit.     Per HPI unless specifically indicated in ROS section below Review of Systems  Constitutional:  Negative for fatigue and fever.  HENT:  Negative for congestion.   Eyes:  Negative for pain.  Respiratory:  Negative for cough and shortness of breath.   Cardiovascular:  Negative for chest pain, palpitations and leg swelling.  Gastrointestinal:  Negative for abdominal pain.  Genitourinary:  Negative for dysuria and vaginal bleeding.  Musculoskeletal:  Negative for back pain.  Neurological:  Negative for syncope, light-headedness and headaches.  Psychiatric/Behavioral:  Negative for dysphoric mood.    Objective:  BP 116/70   Pulse 93   Temp 98.7 F (37.1 C) (Oral)   Ht 5\' 2"  (1.575 m)   Wt 195 lb 2 oz (88.5 kg)   LMP 12/29/2023   SpO2 98%   BMI 35.69 kg/m   Wt Readings from Last 3 Encounters:  01/23/24 195 lb 2 oz (88.5 kg)  11/07/23 198 lb (89.8 kg)  08/23/23 195 lb 4 oz (88.6 kg)  Physical Exam Constitutional:      General: She is not in acute distress.    Appearance: Normal appearance. She is well-developed. She is not ill-appearing or toxic-appearing.  HENT:     Head: Normocephalic.     Right Ear: Hearing, tympanic membrane, ear canal and external ear normal. Tympanic membrane is not erythematous, retracted or bulging.     Left Ear: Hearing, tympanic membrane, ear canal and external ear normal. Tympanic membrane is not erythematous, retracted or bulging.     Nose: No mucosal edema or rhinorrhea.     Right Sinus: No maxillary sinus tenderness or frontal sinus tenderness.     Left Sinus: No maxillary sinus tenderness or frontal sinus tenderness.     Mouth/Throat:     Pharynx: Uvula  midline.  Eyes:     General: Lids are normal. Lids are everted, no foreign bodies appreciated.     Conjunctiva/sclera: Conjunctivae normal.     Pupils: Pupils are equal, round, and reactive to light.  Neck:     Thyroid: No thyroid mass or thyromegaly.     Vascular: No carotid bruit.     Trachea: Trachea normal.  Cardiovascular:     Rate and Rhythm: Normal rate and regular rhythm.     Pulses: Normal pulses.     Heart sounds: Normal heart sounds, S1 normal and S2 normal. No murmur heard.    No friction rub. No gallop.  Pulmonary:     Effort: Pulmonary effort is normal. No tachypnea or respiratory distress.     Breath sounds: Normal breath sounds. No decreased breath sounds, wheezing, rhonchi or rales.  Abdominal:     General: Bowel sounds are normal.     Palpations: Abdomen is soft.     Tenderness: There is no abdominal tenderness.  Musculoskeletal:     Cervical back: Normal range of motion and neck supple.  Skin:    General: Skin is warm and dry.     Findings: No rash.  Neurological:     Mental Status: She is alert and oriented to person, place, and time.     GCS: GCS eye subscore is 4. GCS verbal subscore is 5. GCS motor subscore is 6.     Cranial Nerves: No cranial nerve deficit.     Sensory: No sensory deficit.     Motor: No abnormal muscle tone.     Coordination: Coordination normal.     Gait: Gait normal.     Deep Tendon Reflexes: Reflexes are normal and symmetric.     Comments: Nml cerebellar exam   No papilledema  Psychiatric:        Mood and Affect: Mood is not anxious or depressed.        Speech: Speech normal.        Behavior: Behavior normal. Behavior is cooperative.        Thought Content: Thought content normal.        Cognition and Memory: Memory is not impaired. She does not exhibit impaired recent memory or impaired remote memory.        Judgment: Judgment normal.       Results for orders placed or performed in visit on 01/23/24  CBC with  Differential/Platelet   Collection Time: 01/23/24  2:33 PM  Result Value Ref Range   WBC 5.2 4.0 - 10.5 K/uL   RBC 4.30 3.87 - 5.11 Mil/uL   Hemoglobin 11.4 (L) 12.0 - 15.0 g/dL   HCT 21.3 (L) 08.6 - 57.8 %  MCV 82.3 78.0 - 100.0 fl   MCHC 32.3 30.0 - 36.0 g/dL   RDW 78.2 (H) 95.6 - 21.3 %   Platelets 321.0 150.0 - 400.0 K/uL   Neutrophils Relative % 46.4 43.0 - 77.0 %   Lymphocytes Relative 39.1 12.0 - 46.0 %   Monocytes Relative 10.2 3.0 - 12.0 %   Eosinophils Relative 3.6 0.0 - 5.0 %   Basophils Relative 0.7 0.0 - 3.0 %   Neutro Abs 2.4 1.4 - 7.7 K/uL   Lymphs Abs 2.0 0.7 - 4.0 K/uL   Monocytes Absolute 0.5 0.1 - 1.0 K/uL   Eosinophils Absolute 0.2 0.0 - 0.7 K/uL   Basophils Absolute 0.0 0.0 - 0.1 K/uL  IBC + Ferritin   Collection Time: 01/23/24  2:33 PM  Result Value Ref Range   Iron 39 (L) 42 - 145 ug/dL   Transferrin 086.5 784.6 - 360.0 mg/dL   Saturation Ratios 9.9 (L) 20.0 - 50.0 %   Ferritin 17.2 10.0 - 291.0 ng/mL   TIBC 392.0 250.0 - 450.0 mcg/dL  Comprehensive metabolic panel   Collection Time: 01/23/24  2:33 PM  Result Value Ref Range   Sodium 138 135 - 145 mEq/L   Potassium 3.9 3.5 - 5.1 mEq/L   Chloride 102 96 - 112 mEq/L   CO2 28 19 - 32 mEq/L   Glucose, Bld 68 (L) 70 - 99 mg/dL   BUN 6 6 - 23 mg/dL   Creatinine, Ser 9.62 0.40 - 1.20 mg/dL   Total Bilirubin 0.3 0.2 - 1.2 mg/dL   Alkaline Phosphatase 76 39 - 117 U/L   AST 15 0 - 37 U/L   ALT 10 0 - 35 U/L   Total Protein 7.2 6.0 - 8.3 g/dL   Albumin 4.1 3.5 - 5.2 g/dL   GFR 95.28 >41.32 mL/min   Calcium 9.2 8.4 - 10.5 mg/dL  Vitamin G40   Collection Time: 01/23/24  2:33 PM  Result Value Ref Range   Vitamin B-12 225 211 - 911 pg/mL  TSH   Collection Time: 01/23/24  2:33 PM  Result Value Ref Range   TSH 0.54 0.35 - 5.50 uIU/mL    Assessment and Plan  Dizziness Assessment & Plan: Acute, unclear etiology.  Patient is nontoxic. Symptoms are more lightheadedness and not clearly vertigo. Will  evaluate with labs in detail. Symptoms could correspond with recent increase in dose of medication venlafaxine.  Will decrease to 75 mg daily. Symptoms possibly secondary to anxiety, less likely neurologic symptom like MS but will consider this if lab evaluation unremarkable and if symptoms or not improving.  Encouraged patient to continue hydration.   If not improving as expected she will follow-up.  If symptoms are severe she will go to the emergency room.  Orders: -     CBC with Differential/Platelet -     IBC + Ferritin -     Comprehensive metabolic panel with GFR -     Vitamin B12 -     TSH    Return in about 2 weeks (around 02/06/2024) for  follow up headache and dizziness.Kerby Nora, MD

## 2024-01-23 NOTE — Patient Instructions (Signed)
 Decreased venlafaxine back down to 75 mg daily.  Push water intake.  Please stop at the lab to have labs drawn.

## 2024-01-24 ENCOUNTER — Encounter: Payer: Self-pay | Admitting: Family Medicine

## 2024-01-24 LAB — CBC WITH DIFFERENTIAL/PLATELET
Basophils Absolute: 0 10*3/uL (ref 0.0–0.1)
Basophils Relative: 0.7 % (ref 0.0–3.0)
Eosinophils Absolute: 0.2 10*3/uL (ref 0.0–0.7)
Eosinophils Relative: 3.6 % (ref 0.0–5.0)
HCT: 35.4 % — ABNORMAL LOW (ref 36.0–46.0)
Hemoglobin: 11.4 g/dL — ABNORMAL LOW (ref 12.0–15.0)
Lymphocytes Relative: 39.1 % (ref 12.0–46.0)
Lymphs Abs: 2 10*3/uL (ref 0.7–4.0)
MCHC: 32.3 g/dL (ref 30.0–36.0)
MCV: 82.3 fl (ref 78.0–100.0)
Monocytes Absolute: 0.5 10*3/uL (ref 0.1–1.0)
Monocytes Relative: 10.2 % (ref 3.0–12.0)
Neutro Abs: 2.4 10*3/uL (ref 1.4–7.7)
Neutrophils Relative %: 46.4 % (ref 43.0–77.0)
Platelets: 321 10*3/uL (ref 150.0–400.0)
RBC: 4.3 Mil/uL (ref 3.87–5.11)
RDW: 17.2 % — ABNORMAL HIGH (ref 11.5–15.5)
WBC: 5.2 10*3/uL (ref 4.0–10.5)

## 2024-01-24 LAB — IBC + FERRITIN
Ferritin: 17.2 ng/mL (ref 10.0–291.0)
Iron: 39 ug/dL — ABNORMAL LOW (ref 42–145)
Saturation Ratios: 9.9 % — ABNORMAL LOW (ref 20.0–50.0)
TIBC: 392 ug/dL (ref 250.0–450.0)
Transferrin: 280 mg/dL (ref 212.0–360.0)

## 2024-01-24 LAB — COMPREHENSIVE METABOLIC PANEL
ALT: 10 U/L (ref 0–35)
AST: 15 U/L (ref 0–37)
Albumin: 4.1 g/dL (ref 3.5–5.2)
Alkaline Phosphatase: 76 U/L (ref 39–117)
BUN: 6 mg/dL (ref 6–23)
CO2: 28 meq/L (ref 19–32)
Calcium: 9.2 mg/dL (ref 8.4–10.5)
Chloride: 102 meq/L (ref 96–112)
Creatinine, Ser: 0.81 mg/dL (ref 0.40–1.20)
GFR: 96.9 mL/min (ref >60.00)
Glucose, Bld: 68 mg/dL — ABNORMAL LOW (ref 70–99)
Potassium: 3.9 meq/L (ref 3.5–5.1)
Sodium: 138 meq/L (ref 135–145)
Total Bilirubin: 0.3 mg/dL (ref 0.2–1.2)
Total Protein: 7.2 g/dL (ref 6.0–8.3)

## 2024-01-24 LAB — VITAMIN B12: Vitamin B-12: 225 pg/mL (ref 211–911)

## 2024-01-24 LAB — TSH: TSH: 0.54 u[IU]/mL (ref 0.35–5.50)

## 2024-01-27 ENCOUNTER — Other Ambulatory Visit: Payer: Self-pay | Admitting: Family Medicine

## 2024-01-27 ENCOUNTER — Ambulatory Visit (INDEPENDENT_AMBULATORY_CARE_PROVIDER_SITE_OTHER): Admitting: Licensed Clinical Social Worker

## 2024-01-27 DIAGNOSIS — F329 Major depressive disorder, single episode, unspecified: Secondary | ICD-10-CM

## 2024-01-27 DIAGNOSIS — F419 Anxiety disorder, unspecified: Secondary | ICD-10-CM | POA: Diagnosis not present

## 2024-01-27 NOTE — Progress Notes (Signed)
  [facility] Counselor/Therapist Progress Note  Patient ID: Rebecca Rollins, MRN: [id number]    Date: 01/27/24  Time Spent: 12:06  pm - 1:10 pm : 64 Minutes  Treatment Type: Individual Therapy.  Reported Symptoms: feeling a bit overwhelmed  Mental Status Exam: Appearance:  Well Groomed     Behavior: Appropriate and Sharing  Motor: Normal  Speech/Language:  Normal Rate  Affect: Appropriate  Mood: normal  Thought process: normal  Thought content:   WNL  Sensory/Perceptual disturbances:   WNL  Orientation: oriented to person, place, and time/date  Attention: Good  Concentration: Good  Memory: WNL  Fund of knowledge:  Good  Insight:   Good  Judgment:  Good  Impulse Control: Good   Risk Assessment: Danger to Self:  No Self-injurious Behavior: No Danger to Others: No Duty to Warn:no Physical Aggression / Violence:No  Access to Firearms a concern: No  Gang Involvement:No   Subjective:   Rebecca Rollins participated from office, via video, and consented to treatment. I discussed the limitations of evaluation and management by telemedicine and the availability of in person appointments. The patient expressed understanding and agreed to proceed.  Therapist participated from home office.  Rebecca Rollins reviewed the events of the past week.      Interventions: Narrative  Diagnosis:  Anxiety  Reactive depression  Psychiatric Treatment: No , N/A  Treatment Plan:  [name]  Support System: Family and Friends  [name], and emotional intelligence to set healthy boundaries.     Treatment Level Weekly  Symptoms  Feeling conflicted/Torn   (Status: maintained) Feeling Manic/Crying   (Status: declined)  [name], abandoned, and  neglected by people who say they care and love.  Mother/Father wounds effect relationships with others. Goal: identifying, verbalize sources of anxiety, fears, or concerns.    Target Date: 02/03/24 Frequency: Weekly  Progress: 40 Modality: individual    Therapist will provide referrals for additional resources as appropriate.  Therapist will provide psycho-education regarding Rebecca Rollins diagnosis and corresponding treatment approaches and interventions. Licensed Clinical Mental Health Counselor, Anselmo Pickler, [facility] will support the patient's ability to achieve the goals identified. will employ CBT, BA, Problem-solving, Solution Focused, Mindfulness,  coping skills, & other evidenced-based practices will be used to promote progress towards healthy functioning to help manage decrease symptoms associated with her diagnosis.   Reduce overall level, frequency, and intensity of the feelings of depression, anxiety and panic evidenced by increased self awareness.  Verbally express understanding of the relationship between feelings of depression, anxiety and their impact on thinking patterns and behaviors. Verbalize an understanding of the role that distorted thinking plays in creating fears, excessive worry, and ruminations.  Rebecca Rollins participated in the creation of the treatment plan)   Anselmo Pickler, [facility]

## 2024-01-30 ENCOUNTER — Other Ambulatory Visit: Payer: Self-pay | Admitting: Family Medicine

## 2024-02-03 ENCOUNTER — Ambulatory Visit: Admitting: Licensed Clinical Social Worker

## 2024-02-06 DIAGNOSIS — R42 Dizziness and giddiness: Secondary | ICD-10-CM | POA: Insufficient documentation

## 2024-02-06 NOTE — Assessment & Plan Note (Addendum)
 Acute, unclear etiology.  Patient is nontoxic. Symptoms are more lightheadedness and not clearly vertigo. Will evaluate with labs in detail. Symptoms could correspond with recent increase in dose of medication venlafaxine.  Will decrease to 75 mg daily. Symptoms possibly secondary to anxiety, less likely neurologic symptom like MS but will consider this if lab evaluation unremarkable and if symptoms or not improving.  Encouraged patient to continue hydration.   If not improving as expected she will follow-up.  If symptoms are severe she will go to the emergency room.

## 2024-02-12 ENCOUNTER — Other Ambulatory Visit: Payer: Self-pay | Admitting: Certified Nurse Midwife

## 2024-02-12 DIAGNOSIS — B009 Herpesviral infection, unspecified: Secondary | ICD-10-CM

## 2024-02-13 ENCOUNTER — Other Ambulatory Visit: Payer: Self-pay | Admitting: Family Medicine

## 2024-02-13 DIAGNOSIS — B009 Herpesviral infection, unspecified: Secondary | ICD-10-CM

## 2024-02-13 MED ORDER — VENLAFAXINE HCL ER 75 MG PO CP24: 75.0000 mg | ORAL_CAPSULE | Freq: Every day | ORAL | 1 refills | Status: AC

## 2024-02-13 MED ORDER — VALACYCLOVIR HCL 1 G PO TABS: 1000.0000 mg | ORAL_TABLET | Freq: Every day | ORAL | 0 refills | Status: AC | PRN

## 2024-02-13 NOTE — Addendum Note (Signed)
 Addended by: Damita Lack on: 02/13/2024 09:39 AM   Modules accepted: Orders

## 2024-02-13 NOTE — Telephone Encounter (Signed)
 Copied from CRM [id number]. Topic: Clinical - Prescription Issue >> Feb 12, 2024  5:15 PM DeAngela L wrote: Reason for CRM: Patient called about venlafaxine XR (EFFEXOR-XR) 37.5 MG 24 hr capsule Pharmacy explained to the patient to ask the Dr office to please reword the prescription to say twice a day so they could refill it  [facility] [address], [place]  [address] [place] [address] Phone: [phone]  Fax: [phone]

## 2024-02-13 NOTE — Telephone Encounter (Signed)
 Spoke with [place].  Dr. Ermalene Searing had instructed her to decrease her Venlafaxine dose back down to 75 mg.  I ask her if she would prefer a 75 mg capsule to take once daily vs. 37.5 mg bid.  Patient is fine with the 75 mg once daily.  Rx sent to [facility] [address]    While on the phone she ask if Dr. Ermalene Searing would refill her valacyclovir for HSV II outbreak.  Last refilled 07/30/23 for #30 with no refills by Doreene Burke, CNM.  Okay to refill?

## 2024-02-17 ENCOUNTER — Ambulatory Visit (INDEPENDENT_AMBULATORY_CARE_PROVIDER_SITE_OTHER): Admitting: Licensed Clinical Social Worker

## 2024-02-17 DIAGNOSIS — F329 Major depressive disorder, single episode, unspecified: Secondary | ICD-10-CM

## 2024-02-17 DIAGNOSIS — F419 Anxiety disorder, unspecified: Secondary | ICD-10-CM | POA: Diagnosis not present

## 2024-02-17 NOTE — Progress Notes (Signed)
 [facility] Counselor/Therapist Progress Note  Patient ID: Rebecca Rollins, MRN: [id number]    Date: 02/17/24  Time Spent: 12:02  pm - 1:06 pm : 64 Minutes  Treatment Type: Individual Therapy.  Reported Symptoms: irritated and frustrated with self for allowing her boundaries to be pushed back  Mental Status Exam: Appearance:  Casual     Behavior: Sharing  Motor: Normal  Speech/Language:  Normal Rate  Affect: Appropriate  Mood: irritable  Thought process: goal directed  Thought content:   WNL  Sensory/Perceptual disturbances:   WNL  Orientation: oriented to person, place, and time/date  Attention: Good  Concentration: Good  Memory: WNL  Fund of knowledge:  Good  Insight:   Good  Judgment:  Good  Impulse Control: Good   Risk Assessment: Danger to Self:  No Self-injurious Behavior: No Danger to Others: No Duty to Warn:no Physical Aggression / Violence:No  Access to Firearms a concern: No  Gang Involvement:No   Subjective:   Rebecca Rollins participated from home, via video, and consented to treatment. I discussed the limitations of evaluation and management by telemedicine and the availability of in person appointments. The patient expressed understanding and agreed to proceed.  Therapist participated from home office.  Denim reviewed the events of the past week.      Interventions: Assertiveness/Communication and Insight-Oriented  Diagnosis:  Anxiety Reactive Depression  Psychiatric Treatment: No ,  N/A  Treatment Plan:  Client Abilities/Strengths Rebecca Rollins is becoming self aware and working on being action oriented in putting ego aside to make healthy choices.    Support System: Family and Friends  Rebecca Rollins would like to learn how to trust others in order to build healthy relationships.     Treatment Level Weekly  Symptoms  Irritated   (Status: declined) Lack of  Trust/Questioning   (Status: declined)  [name], abandoned, and neglected by people who say they care and love. Mother/Father wounds effect relationships with others. Focus on value systems to assign value and allow for healthy decision making.     Target Date: 03/30/24 Frequency: Weekly  Progress: 0 Modality: individual    Therapist will provide referrals for additional resources as appropriate.  Therapist will provide psycho-education regarding Rebecca Rollins's diagnosis and corresponding treatment approaches and interventions. Licensed Clinical Mental Health Counselor, Grandville Lax, [facility] will support the patient's ability to achieve the goals identified. will employ CBT, BA, Problem-solving, Solution Focused, Mindfulness,  coping skills, & other evidenced-based practices will be used to promote progress towards healthy functioning to help manage decrease symptoms associated with her diagnosis.   Reduce overall level, frequency, and intensity of the feelings of depression, anxiety and panic evidenced by decreased anxiety and depression from 6 to 7 days/week to 0 to 1 days/week per client report for at least 3 consecutive months. Verbally express understanding of the relationship between feelings of depression, anxiety and their impact on thinking patterns and behaviors. Verbalize an understanding of the role that distorted thinking plays in creating fears, excessive worry, and ruminations.  Rebecca Rollins participated in the creation of the treatment plan)   Alesandro Stueve, LCMHC

## 2024-02-24 ENCOUNTER — Ambulatory Visit (INDEPENDENT_AMBULATORY_CARE_PROVIDER_SITE_OTHER): Admitting: Licensed Clinical Social Worker

## 2024-02-24 DIAGNOSIS — F419 Anxiety disorder, unspecified: Secondary | ICD-10-CM

## 2024-02-24 NOTE — Progress Notes (Signed)
  [facility] Counselor/Therapist Progress Note  Patient ID: Rebecca Rollins, MRN: [id number]    Date: 02/24/24  Time Spent: 12:08  am - 1:13 pm : 65 Minutes  Treatment Type: Individual Therapy.  Reported Symptoms: feeling disconnected from decision she made to live with her sister, sad, tearful over disconnection with immediate family, wants to [employer] life  Mental Status Exam: Appearance:  Well Groomed     Behavior: Sharing  Motor: Normal  Speech/Language:  Normal Rate  Affect: Appropriate  Mood: normal  Thought process: normal  Thought content:   WNL  Sensory/Perceptual disturbances:   WNL  Orientation: oriented to person, place, and time/date  Attention: Good  Concentration: Good  Memory: WNL  Fund of knowledge:  Good  Insight:   Good  Judgment:  Good  Impulse Control: Good   Risk Assessment: Danger to Self:  No Self-injurious Behavior: No Danger to Others: No Duty to Warn:no Physical Aggression / Violence:No  Access to Firearms a concern: No  Gang Involvement:No   Subjective:   Rebecca Rollins participated in the session, in person in the office with the therapist, and consented to treatment. Rebecca Rollins reviewed the events of the past week.      Interventions: Assertiveness/Communication and Ego-Supportive  Diagnosis:   Anxiety  Psychiatric Treatment: No , N/A  Treatment Plan:  Client Abilities/Strengths Rebecca Rollins is entering into a better healing space and now more aware of unhealthy patterns in her family.  Wants to be the change but does not feel like she wants to lead them.    Support System: Family and Friends  Merchant navy officer of Needs Rebecca Rollins would like to focus on healing her wounds.  Her goal is to create a different environment for her daughter free from the pain she feels she has endured from her immediate family members.     Treatment Level Weekly  Symptoms  Tearful/Sad   (Status:  maintained) Disappointed   (Status: declined)  Goals:   Rebecca Rollins experiences symptoms of hurt, abandoned, and neglected by people who say they care and love. Mother/Father wounds effect relationships with others. Focus on value systems to assign value and allow for healthy decision making.       Target Date: 03/30/24 Frequency: Weekly  Progress: 0 Modality: individual    Therapist will provide referrals for additional resources as appropriate.  Therapist will provide psycho-education regarding Rebecca Rollins diagnosis and corresponding treatment approaches and interventions. Licensed Clinical Mental Health Counselor, Grandville Lax, [facility] will support the patient's ability to achieve the goals identified. will employ CBT, BA, Problem-solving, Solution Focused, Mindfulness,  coping skills, & other evidenced-based practices will be used to promote progress towards healthy functioning to help manage decrease symptoms associated with her diagnosis.   Reduce overall level, frequency, and intensity of the feelings of depression, anxiety and panic evidenced by decreased feelings of abandonment that increase her desire for connection in unhealthy places.   Verbally express understanding of the relationship between feelings of depression, anxiety and their impact on thinking patterns and behaviors. Verbalize an understanding of the role that distorted thinking plays in creating fears, excessive worry, and ruminations.  Rebecca Rollins participated in the creation of the treatment plan)      Jamyah Folk, LCMHC

## 2024-03-05 ENCOUNTER — Ambulatory Visit: Admitting: Licensed Clinical Social Worker

## 2024-03-05 DIAGNOSIS — F419 Anxiety disorder, unspecified: Secondary | ICD-10-CM | POA: Diagnosis not present

## 2024-03-05 NOTE — Progress Notes (Signed)
 [facility] Counselor/Therapist Progress Note  Patient ID: Rebecca Rollins, MRN: [id number]    Date: 03/05/24  Time Spent: 10:05  am - 11:49 am : 44 Minutes  Treatment Type: Individual Therapy.  Reported Symptoms:   Mental Status Exam: Appearance:  Well Groomed     Behavior: Sharing  Motor: Normal  Speech/Language:  Normal Rate  Affect: Appropriate  Mood: normal  Thought process: normal  Thought content:   WNL  Sensory/Perceptual disturbances:   WNL  Orientation: oriented to person, place, and time/date  Attention: Good  Concentration: Good  Memory: WNL  Fund of knowledge:  Good  Insight:   Good  Judgment:  Good  Impulse Control: Good   Risk Assessment: Danger to Self:  No Self-injurious Behavior: No Danger to Others: No Duty to Warn:no Physical Aggression / Violence:No  Access to Firearms a concern: No  Gang Involvement:No   Subjective:   Rebecca Rollins participated from office, via video, and consented to treatment. I discussed the limitations of evaluation and management by telemedicine and the availability of in person appointments. The patient expressed understanding and agreed to proceed.  Therapist participated from home office.  Joani reviewed the events of the past week. Several interruptions with students and then my computer froze at 11:49 and session was cut short. Will have schedulers contact to set next appointment.       Interventions: Solution-Oriented/Positive Psychology and Insight-Oriented  Diagnosis:  Anxiety  Psychiatric Treatment: No , N/A  Treatment Plan:  Client Abilities/Strengths Shavondra is open and committed to sessions.    Support System: Family and Friends  Merchant [employer] officer of Needs Monik would like to Yoceline would like to focus on healing her wounds.  Her goal is to create a different environment for her daughter free from the pain she feels she has endured from her immediate  family members.     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  Career Planning discussion-micro goal setting.   Concerned about finances since she will not be working over the summer.  Career options focus at this time.    Target Date: 03/10/24 Frequency: Weekly  Progress: 0 Modality: individual    Therapist will provide referrals for additional resources as appropriate.  Therapist will provide psycho-education regarding Peityn's diagnosis and corresponding treatment approaches and interventions. Licensed Clinical Mental Health Counselor, Grandville Lax, [facility] will support the patient's ability to achieve the goals identified. will employ CBT, BA, Problem-solving, Solution Focused, Mindfulness,  coping skills, & other evidenced-based practices will be used to promote progress towards healthy functioning to help manage decrease symptoms associated with her diagnosis.   Reduce overall level, frequency, and intensity of the feelings of depression, anxiety and panic evidenced by decreased anxiety and irritability.   Verbally express understanding of the relationship between feelings of depression, anxiety and their impact on thinking patterns and behaviors. Verbalize an understanding of the role that distorted thinking plays in creating fears, excessive worry, and ruminations.  Noemi Batter participated in the creation of the treatment plan)   Kariyah Baugh, LCMHC

## 2024-03-25 ENCOUNTER — Telehealth: Payer: Self-pay | Admitting: Family Medicine

## 2024-03-25 NOTE — Telephone Encounter (Signed)
 Spoke with [place]. She states back in January she saw Dr. Cherlyn Cornet due to depression and anxiety.  She was prescribed Venlafaxine .  During this time, while she was dealing with these issues,  it prevented her from really do anything in her classes she was taking.  She is asking for a letter in regards to this to give to the school.

## 2024-03-25 NOTE — Telephone Encounter (Signed)
 Copied from CRM [id number]. Topic: Medical Record Request - Records Request >> Mar 24, 2024  4:41 PM Juleen Oakland F wrote: Reason for CRM: Patient needs documentation for her school regarding her mental health from a few months ago. Please call her at [phone] (M)

## 2024-03-27 ENCOUNTER — Encounter: Payer: Self-pay | Admitting: Family Medicine

## 2024-03-27 NOTE — Telephone Encounter (Signed)
 Rebecca Rollins notified letter has been sent to her MyChart.  She will try to get it off her MyChart but if she has problems she will call us  back for a printed copy.

## 2024-03-27 NOTE — Progress Notes (Signed)
 Sent note to patient by MyChart.  If she approves of the letter for her needs, please print and I will sign or stamp.

## 2024-08-17 ENCOUNTER — Ambulatory Visit: Admitting: Licensed Clinical Social Worker

## 2024-08-27 ENCOUNTER — Ambulatory Visit (INDEPENDENT_AMBULATORY_CARE_PROVIDER_SITE_OTHER): Admitting: Licensed Clinical Social Worker

## 2024-08-27 DIAGNOSIS — F419 Anxiety disorder, unspecified: Secondary | ICD-10-CM | POA: Diagnosis not present

## 2024-08-27 NOTE — Progress Notes (Signed)
 [facility] Counselor/Therapist Progress Note  Patient ID: Rebecca Rollins, MRN: [id number]    Date: 08/27/24  Time Spent: 12:05  pm - 1:03 pm : 62 Minutes  Treatment Type: Individual Therapy.  Reported Symptoms: family drama/discord, placing blame between family members  Mental Status Exam: Appearance:  Well Groomed     Behavior: Appropriate and Sharing  Motor: Normal  Speech/Language:  Normal Rate  Affect: Appropriate  Mood: normal  Thought process: normal  Thought content:   WNL  Sensory/Perceptual disturbances:   WNL  Orientation: oriented to person, place, and time/date  Attention: Good  Concentration: Good  Memory: WNL  Fund of knowledge:  Good  Insight:   Good  Judgment:  Good  Impulse Control: Good   Risk Assessment: Danger to Self:  No Self-injurious Behavior: No Danger to Others: No Duty to Warn:no Physical Aggression / Violence:No  Access to Firearms a concern: No  Gang Involvement:No   Subjective:   Rebecca Rollins participated from office, via video, and consented to treatment. I discussed the limitations of evaluation and management by telemedicine and the availability of in person appointments. The patient expressed understanding and agreed to proceed.  Therapist participated from home office.  Rebecca Rollins reviewed the events of the past week. returned to therapy after 54-month absence. Reports feeling overwhelmed managing three jobs and single parenting. Expressed desire to re-engage in therapy to support mental health and strengthen faith in Jesus for clarity and resilience. Shared distress related to family discord and self-awareness of compromising personal values, leading to anticipated negative outcomes.     Interventions: Insight-Oriented  Diagnosis:  Anxiety  Psychiatric Treatment: No , N/A  Treatment Plan:  Client Abilities/Strengths Rebecca Rollins is open to sessions.  engaged openly in session, reflective and insightful. Mood mildly  dysphoric but motivated. Affect congruent with discussion. Thought processes logical and goal-directed.  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     Treatment Level Weekly  Symptoms  overwhelmed   (Status: declined) disappointed   (Status: declined)  Goals:   Rebecca Rollins experiencing stress, fatigue, and emotional strain related to overextension and relational conflicts. Demonstrates insight and motivation for change. Focus on self-compassion and aligning choices with faith and personal values indicated.  Resume weekly therapy sessions during lunch breaks for accessibility. Support client in setting new goals next session. Focus on self-compassion, boundary-setting, and faith-based coping strategies. Encourage journaling and prayer for reflection and grounding between sessions.  Target Date: 08/27/24 Frequency: Weekly  Progress: 0 Modality: individual    Therapist will provide referrals for additional resources as appropriate.  Therapist will provide psycho-education regarding Rebecca Rollins's diagnosis and corresponding treatment approaches and interventions. Licensed Clinical Mental Health Counselor, Tawni Louder, [facility] will support the patient's ability to achieve the goals identified. will employ CBT, BA, Problem-solving, Solution Focused, Mindfulness,  coping skills, & other evidenced-based practices will be used to promote progress towards healthy functioning to help manage decrease symptoms associated with her diagnosis.   Reduce overall level, frequency, and intensity of the feelings of depression, anxiety and panic evidenced by decreased feelings of being overwhelmed/consumed by past emotions from 6 to 7 days/week to 0 to 1 days/week per client report for at least 3 consecutive months. Verbally express understanding of the relationship between feelings of depression, anxiety and  their impact on thinking patterns and behaviors. Verbalize an understanding of the role that distorted thinking plays in creating fears, excessive  worry, and ruminations.  Rebecca Rollins participated in the creation of the treatment plan)   Rebecca Rollins, LCMHC

## 2024-09-03 ENCOUNTER — Ambulatory Visit (INDEPENDENT_AMBULATORY_CARE_PROVIDER_SITE_OTHER): Admitting: Licensed Clinical Social Worker

## 2024-09-03 DIAGNOSIS — F419 Anxiety disorder, unspecified: Secondary | ICD-10-CM | POA: Diagnosis not present

## 2024-09-03 NOTE — Progress Notes (Signed)
 [facility] Counselor/Therapist Progress Note  Patient ID: Rebecca Rollins, MRN: [id number]    Date: 09/03/24  Time Spent: 11:00  am - 12:05 am : 65 Minutes  Treatment Type: Individual Therapy.  Reported Symptoms: was tearful but engaged, reflective, and articulate. Affect congruent with discussed themes of loss and longing. Insightful regarding patterns of abandonment and attachment.  Mental Status Exam: Appearance:  Well Groomed     Behavior: Appropriate and Sharing  Motor: Normal  Speech/Language:  Normal Rate  Affect: Appropriate  Mood: normal  Thought process: normal  Thought content:   WNL  Sensory/Perceptual disturbances:   WNL  Orientation: oriented to person, place, and time/date  Attention: Good  Concentration: Good  Memory: WNL  Fund of knowledge:  Good  Insight:   Good  Judgment:  Good  Impulse Control: Good   Risk Assessment: Danger to Self:  No Self-injurious Behavior: No Danger to Others: No Duty to Warn:no Physical Aggression / Violence:No  Access to Firearms a concern: No  Gang Involvement:No   Subjective:   Rebecca Rollins participated from office, via video, and consented to treatment. I discussed the limitations of evaluation and management by telemedicine and the availability of in person appointments. The patient expressed understanding and agreed to proceed.  Therapist participated from home office.  Rebecca Rollins reviewed the events of the past week. Returned to therapy after a 52-month pause, expressing desire for consistency, accountability, and personal growth. She reports increased self-awareness and reliance on her faith in Jesus as she works to heal and "get her life together." Client discussed family history, sharing that her mother was unable to raise her and her sister, resulting in them living with their grandmother who provided consistent care and love. When her mother later regained custody, the relationship remained strained. Client  expressed pain over her mother's later adoption of other children, perceiving it as an attempt to "right her wrongs," which deepens her sense of rejection. She identifies her grandparents as her true parents due to their nurturing role. Client also reported that her biological father, a hotel manager man, signed over his rights when she was a baby; she met him at age 30 and recognizes his struggles as a young parent but still feels abandoned. She shared grief over never forming a relationship with her older half-sister, who has since passed away.     Interventions: Cognitive Behavioral Therapy and Insight-Oriented  Diagnosis:  Anxiety  Psychiatric Treatment: No , N/A  Treatment Plan:  Client Abilities/Strengths Rebecca Rollins is open to sessions.    Support System: Family and Friends   [employer] Officer of Needs Rebecca Rollins would like to resume sessions and create a new goal.     Treatment Level Weekly  Symptoms  tearful   (Status: maintained) longing   (Status: maintained)  Goals:   Rebecca Rollins will strengthen emotional resilience and spiritual grounding through consistent therapy participation, faith-based coping strategies, and self-compassionate behavior changes to support overall life balance and personal growth. Themes of abandonment, unresolved grief, and complex family dynamics impacting current emotional health and sense of identity. Increased motivation for healing and faith-based growth. Appropriate insight and emotional processing noted. Support client in setting new therapy goals next session focused on self-compassion, boundary-setting, and faith-based coping. Encourage journaling and prayer for reflection and grounding between sessions. Continue exploration of family-of-origin dynamics and impact on self-worth and relationships. Maintain weekly sessions for accountability and emotional consistency.  Target Date: 11/02/24 Frequency: Weekly   Progress:  0 Modality: individual    Therapist will provide referrals for additional resources as appropriate.  Therapist will provide psycho-education regarding Rebecca Rollins's diagnosis and corresponding treatment approaches and interventions. Licensed Clinical Mental Health Counselor, Rebecca Rollins, [facility] will support the patient's ability to achieve the goals identified. will employ CBT, BA, Problem-solving, Solution Focused, Mindfulness,  coping skills, & other evidenced-based practices will be used to promote progress towards healthy functioning to help manage decrease symptoms associated with her diagnosis.   Reduce overall level, frequency, and intensity of the feelings of depression, anxiety and panic evidenced by decreased lack of emotional resilience from 6 to 7 days/week to 0 to 1 days/week per client report for at least 3 consecutive months. Verbally express understanding of the relationship between feelings of depression, anxiety and their impact on thinking patterns and behaviors. Verbalize an understanding of the role that distorted thinking plays in creating fears, excessive worry, and ruminations.  [name] participated in the creation of the treatment plan)   Rebecca Rollins, LCMHC

## 2024-09-07 ENCOUNTER — Ambulatory Visit: Payer: Self-pay

## 2024-09-07 ENCOUNTER — Ambulatory Visit (INDEPENDENT_AMBULATORY_CARE_PROVIDER_SITE_OTHER): Admitting: Licensed Clinical Social Worker

## 2024-09-07 DIAGNOSIS — F329 Major depressive disorder, single episode, unspecified: Secondary | ICD-10-CM

## 2024-09-07 DIAGNOSIS — F419 Anxiety disorder, unspecified: Secondary | ICD-10-CM | POA: Diagnosis not present

## 2024-09-07 DIAGNOSIS — F4323 Adjustment disorder with mixed anxiety and depressed mood: Secondary | ICD-10-CM

## 2024-09-07 NOTE — Telephone Encounter (Signed)
 FYI Only or Action Required?: FYI only for provider: appointment scheduled on 11.5.25.  Patient was last seen in primary care on 01/23/2024 by Avelina Greig BRAVO, MD.  Called Nurse Triage reporting Migraine.  Symptoms began several months ago.  Interventions attempted: OTC medications: tylenol  and Rest, hydration, or home remedies.  Symptoms are: gradually worsening.  Triage Disposition: See Physician Within 24 Hours  Patient/caregiver understands and will follow disposition?: Yes    Copied from CRM #[id number]. Topic: Clinical - Red Word Triage >> Sep 07, 2024  4:38 PM Joesph NOVAK wrote: Red Word that prompted transfer to Nurse Triage: Really bad migraines, everyday. Dizziness. Reason for Disposition  [1] MODERATE headache (e.g., interferes with normal activities) AND [2] present > 24 hours AND [3] unexplained  (Exceptions: Pain medicines not tried, typical migraine, or headache part of viral illness.)  Answer Assessment - Initial Assessment Questions Pt states she has been having headaches over the last few months. She states they are getting more frequent. She states since Friday she has been having daily headaches. Some come on suddenly and then other times gradually. She states she has been having dizziness on an off for a year. Thought it was due to antidepressant she is on and lowered the dosage and it got better. Some times she still gets dizzy but the last week she has been getting dizzy with her headaches.   1. LOCATION: Where does it hurt?      Some times it's all over and some times its in one area 2. ONSET: When did the headache start? (e.g., minutes, hours, days)      Last few months  3. PATTERN: Does the pain come and go, or has it been constant since it started?     intermittent 4. SEVERITY: How bad is the pain? and What does it keep you from doing?  (e.g., Scale 1-10; mild, moderate, or severe)     8-9 5. RECURRENT SYMPTOM: Have you ever had headaches before? If  Yes, ask: When was the last time? and What happened that time?      Just the last couple of months but worsening in frequency and intensity 6. CAUSE: What do you think is causing the headache?     unknown 7. MIGRAINE: Have you been diagnosed with migraine headaches? If Yes, ask: Is this headache similar?      no 8. HEAD INJURY: Has there been any recent injury to your head?      No  9. OTHER SYMPTOMS: Do you have any other symptoms? (e.g., fever, stiff neck, eye pain, sore throat, cold symptoms)     no 10. PREGNANCY: Is there any chance you are pregnant? When was your last menstrual period?       no  Protocols used: Headache-A-AH

## 2024-09-07 NOTE — Progress Notes (Unsigned)
 [facility] Counselor/Therapist Progress Note  Patient ID: Rebecca Rollins, MRN: [id number]    Date: 09/07/24  Time Spent: 12:05  pm - 1:00 pm : 55 Minutes  Treatment Type: Individual Therapy.  Reported Symptoms: irritable, sad, depression-financial struggles, exhausted  Mental Status Exam: Appearance:  Casual     Behavior: Appropriate, Sharing, Rationalizing, and Agitated  Motor: Normal  Speech/Language:  Normal Rate  Affect: Appropriate  Mood: anxious, irritable, and sad  Thought process: circumstantial  Thought content:   WNL  Sensory/Perceptual disturbances:   WNL  Orientation: oriented to person, place, and time/date  Attention: Good  Concentration: Good  Memory: WNL  Fund of knowledge:  Good  Insight:   Good  Judgment:  Good  Impulse Control: Good   Risk Assessment: Danger to Self:  No Self-injurious Behavior: No Danger to Others: No Duty to Warn:no Physical Aggression / Violence:No  Access to Firearms a concern: No  Gang Involvement:No   Subjective:   Rebecca Rollins participated from office, via video, and consented to treatment. I discussed the limitations of evaluation and management by telemedicine and the availability of in person appointments. The patient expressed understanding and agreed to proceed.  Therapist participated from home office.  Rebecca Rollins reviewed the events of the past week. Patient returned to therapy after approximately five months and reports feeling sad, overwhelmed, and spiritually disconnected. She expressed that multiple areas of her life feel disrupted and stated she is working to increase her faith in God to cope. Patient acknowledged making poor decisions recently, which she attributes to feeling lost and emotionally drained     Interventions: Cognitive Behavioral Therapy, Solution-Oriented/Positive Psychology, and Insight-Oriented  Diagnosis:  Reactive depression  Anxiety  Psychiatric Treatment: No ,  N/A  Treatment Plan:  Client Abilities/Strengths Rebecca Rollins is open to sessions.  Patient presents with depressed mood, low energy, and subdued affect. Speech is slowed but coherent. Therapist notes increased expressions of hopelessness and self-doubt compared to prior sessions. No suicidal ideation or intent reported.  Support System: Family and Friends   [employer] Officer of Needs Rebecca Rollins would like to like to resume sessions and focus on a new goal.      Treatment Level Weekly  Symptoms  tearful   (Status: maintained) Sad/overwhelmed   (Status: declined)  Goals:   Rebecca Rollins Symptoms consistent with major depressive episode or adjustment disorder with depressed mood. Depression appears to have developed during therapy absence, now impacting functioning and decision-making. Patient demonstrates insight and openness to exploring both psychological and faith-based perspectives on her experience. Address depressive symptoms through supportive and insight-oriented therapy. Explore connection between spiritual beliefs and coping strategies. Identify behavioral activation goals to improve daily functioning. Monitor mood changes and assess progress weekly. Reinforce faith-based grounding and self-compassion techniques to strengthen resilience. Target Date: 11/02/24 Frequency: Weekly  Progress: 0 Modality: individual    Therapist will provide referrals for additional resources as appropriate.  Therapist will provide psycho-education regarding Rebecca Rollins's diagnosis and corresponding treatment approaches and interventions. Licensed Clinical Mental Health Counselor, Tawni Louder, [facility] will support the patient's ability to achieve the goals identified. will employ CBT, BA, Problem-solving, Solution Focused, Mindfulness,  coping skills, & other evidenced-based practices will be used to promote progress towards healthy functioning to help manage decrease symptoms  associated with her diagnosis.   Reduce overall level, frequency, and intensity of the feelings of depression, anxiety and panic evidenced by decreased emotional abandonment from 6 to 7 days/week to 0 to 1  days/week per client report for at least 3 consecutive months. Verbally express understanding of the relationship between feelings of depression, anxiety and their impact on thinking patterns and behaviors. Verbalize an understanding of the role that distorted thinking plays in creating fears, excessive worry, and ruminations.  Rebecca Rollins participated in the creation of the treatment plan)   Shamon Cothran, LCMHC

## 2024-09-09 ENCOUNTER — Ambulatory Visit: Admitting: Family Medicine

## 2024-09-09 ENCOUNTER — Encounter: Payer: Self-pay | Admitting: Family Medicine

## 2024-09-09 VITALS — BP 108/66 | HR 101 | Temp 99.0°F | Resp 16 | Ht 62.0 in | Wt 201.1 lb

## 2024-09-09 DIAGNOSIS — H6123 Impacted cerumen, bilateral: Secondary | ICD-10-CM

## 2024-09-09 DIAGNOSIS — G43009 Migraine without aura, not intractable, without status migrainosus: Secondary | ICD-10-CM

## 2024-09-09 NOTE — Progress Notes (Signed)
 Patient ID: Rebecca Rollins, female    DOB: 07-May-1991, 33 y.o.   MRN: [id number]  This visit was conducted in person.  BP 108/66   Pulse (!) 101   Temp 99 F (37.2 C) (Oral)   Resp 16   Ht 5' 2 (1.575 m)   Wt 201 lb 2 oz (91.2 kg)   LMP 08/17/2024 (Approximate)   SpO2 94%   BMI 36.79 kg/m    CC:  Chief Complaint  Patient presents with   Migraine    Migraines/headaches daily, denies n/v Some dizziness    Subjective:   HPI: Rebecca Rollins is a 33 y.o. female presenting on 09/09/2024 for Migraine (Migraines/headaches daily, denies n/v/Some dizziness)   Reviewed labs from eval of dizziness in 02/2024  Anemia and low iron , Hg 11.4     Now in last few months.SABRA off and on but now in last  week daily.  Headache over entire head.. pounding pressure, occ in left occiput.  No N/V.  No aura.  She is sensitive to sound and light.  Associated with mild dizziness.  No numbness, no weakness no vision change.   Increased stress stress in last few months. 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  Gets 6-8 hours of sleep each night.  Good water intake.  No anxiety.. not on venlafaxine . She has been seeing a therapist.  Uncle and granddad, cousin and possibly Mom with migraine.   Has tried tylenol  off and on.   Cannot use ibuprofen .. causes GI upset.  Relevant past medical, surgical, family and social history reviewed and updated as indicated. Interim medical history since our last visit reviewed. Allergies and medications reviewed and updated. Outpatient Medications Prior to Visit  Medication Sig Dispense Refill   pantoprazole  (PROTONIX ) 40 MG tablet Take 1 tablet (40 mg total) by mouth daily. 30 tablet 1   triamcinolone  cream (KENALOG ) 0.1 % Apply 1 Application topically 2 (two) times daily. 30 g 0   valACYclovir  (VALTREX ) 1000 MG tablet Take 1 tablet (1,000 mg total) by mouth daily as needed. 30 tablet 0   venlafaxine  XR (EFFEXOR  XR) 75 MG 24 hr capsule Take 1  capsule (75 mg total) by mouth daily with breakfast. 90 capsule 1   No facility-administered medications prior to visit.     Per HPI unless specifically indicated in ROS section below Review of Systems  Constitutional:  Negative for fatigue and fever.  HENT:  Negative for congestion.   Eyes:  Negative for pain.  Respiratory:  Negative for cough and shortness of breath.   Cardiovascular:  Negative for chest pain, palpitations and leg swelling.  Gastrointestinal:  Negative for abdominal pain.  Genitourinary:  Negative for dysuria and vaginal bleeding.  Musculoskeletal:  Negative for back pain.  Neurological:  Positive for dizziness and headaches. Negative for syncope and light-headedness.  Psychiatric/Behavioral:  Negative for dysphoric mood.    Objective:  BP 108/66   Pulse (!) 101   Temp 99 F (37.2 C) (Oral)   Resp 16   Ht 5' 2 (1.575 m)   Wt 201 lb 2 oz (91.2 kg)   LMP 08/17/2024 (Approximate)   SpO2 94%   BMI 36.79 kg/m   Wt Readings from Last 3 Encounters:  09/09/24 201 lb 2 oz (91.2 kg)  01/23/24 195 lb 2 oz (88.5 kg)  11/07/23 198 lb (89.8 kg)      Physical Exam Constitutional:      General: She is not in acute  distress.    Appearance: Normal appearance. She is well-developed. She is not ill-appearing or toxic-appearing.  HENT:     Head: Normocephalic.     Right Ear: Hearing, tympanic membrane, ear canal and external ear normal. Tympanic membrane is not erythematous, retracted or bulging.     Left Ear: Hearing, tympanic membrane, ear canal and external ear normal. Tympanic membrane is not erythematous, retracted or bulging.     Nose: No mucosal edema or rhinorrhea.     Right Sinus: No maxillary sinus tenderness or frontal sinus tenderness.     Left Sinus: No maxillary sinus tenderness or frontal sinus tenderness.     Mouth/Throat:     Pharynx: Uvula midline.  Eyes:     General: Lids are normal. Lids are everted, no foreign bodies appreciated.      Conjunctiva/sclera: Conjunctivae normal.     Pupils: Pupils are equal, round, and reactive to light.  Neck:     Thyroid : No thyroid  mass or thyromegaly.     Vascular: No carotid bruit.     Trachea: Trachea normal.  Cardiovascular:     Rate and Rhythm: Normal rate and regular rhythm.     Pulses: Normal pulses.     Heart sounds: Normal heart sounds, S1 normal and S2 normal. No murmur heard.    No friction rub. No gallop.  Pulmonary:     Effort: Pulmonary effort is normal. No tachypnea or respiratory distress.     Breath sounds: Normal breath sounds. No decreased breath sounds, wheezing, rhonchi or rales.  Abdominal:     General: Bowel sounds are normal.     Palpations: Abdomen is soft.     Tenderness: There is no abdominal tenderness.  Musculoskeletal:     Cervical back: Normal range of motion and neck supple.  Skin:    General: Skin is warm and dry.     Findings: No rash.  Neurological:     Mental Status: She is alert and oriented to person, place, and time.     GCS: GCS eye subscore is 4. GCS verbal subscore is 5. GCS motor subscore is 6.     Cranial Nerves: No cranial nerve deficit.     Sensory: No sensory deficit.     Motor: No abnormal muscle tone.     Coordination: Coordination normal.     Gait: Gait normal.     Deep Tendon Reflexes: Reflexes are normal and symmetric.     Comments: Nml cerebellar exam   No papilledema  Psychiatric:        Mood and Affect: Mood is not anxious or depressed.        Speech: Speech normal.        Behavior: Behavior normal. Behavior is cooperative.        Thought Content: Thought content normal.        Cognition and Memory: Memory is not impaired. She does not exhibit impaired recent memory or impaired remote memory.        Judgment: Judgment normal.       Results for orders placed or performed in visit on 01/23/24  CBC with Differential/Platelet   Collection Time: 01/23/24  2:33 PM  Result Value Ref Range   WBC 5.2 4.0 - 10.5 K/uL    RBC 4.30 3.87 - 5.11 Mil/uL   Hemoglobin 11.4 (L) 12.0 - 15.0 g/dL   HCT 64.5 (L) 63.9 - 53.9 %   MCV 82.3 78.0 - 100.0 fl   MCHC 32.3 30.0 - 36.0  g/dL   RDW 82.7 (H) 88.4 - 84.4 %   Platelets 321.0 150.0 - 400.0 K/uL   Neutrophils Relative % 46.4 43.0 - 77.0 %   Lymphocytes Relative 39.1 12.0 - 46.0 %   Monocytes Relative 10.2 3.0 - 12.0 %   Eosinophils Relative 3.6 0.0 - 5.0 %   Basophils Relative 0.7 0.0 - 3.0 %   Neutro Abs 2.4 1.4 - 7.7 K/uL   Lymphs Abs 2.0 0.7 - 4.0 K/uL   Monocytes Absolute 0.5 0.1 - 1.0 K/uL   Eosinophils Absolute 0.2 0.0 - 0.7 K/uL   Basophils Absolute 0.0 0.0 - 0.1 K/uL  IBC + Ferritin   Collection Time: 01/23/24  2:33 PM  Result Value Ref Range   Iron  39 (L) 42 - 145 ug/dL   Transferrin 719.9 787.9 - 360.0 mg/dL   Saturation Ratios 9.9 (L) 20.0 - 50.0 %   Ferritin 17.2 10.0 - 291.0 ng/mL   TIBC 392.0 250.0 - 450.0 mcg/dL  Comprehensive metabolic panel   Collection Time: 01/23/24  2:33 PM  Result Value Ref Range   Sodium 138 135 - 145 mEq/L   Potassium 3.9 3.5 - 5.1 mEq/L   Chloride 102 96 - 112 mEq/L   CO2 28 19 - 32 mEq/L   Glucose, Bld 68 (L) 70 - 99 mg/dL   BUN 6 6 - 23 mg/dL   Creatinine, Ser 9.18 0.40 - 1.20 mg/dL   Total Bilirubin 0.3 0.2 - 1.2 mg/dL   Alkaline Phosphatase 76 39 - 117 U/L   AST 15 0 - 37 U/L   ALT 10 0 - 35 U/L   Total Protein 7.2 6.0 - 8.3 g/dL   Albumin 4.1 3.5 - 5.2 g/dL   GFR 03.09 >39.99 mL/min   Calcium 9.2 8.4 - 10.5 mg/dL  Vitamin A87   Collection Time: 01/23/24  2:33 PM  Result Value Ref Range   Vitamin B-12 225 211 - 911 pg/mL  TSH   Collection Time: 01/23/24  2:33 PM  Result Value Ref Range   TSH 0.54 0.35 - 5.50 uIU/mL    Assessment and Plan  Migraine without aura and without status migrainosus, not intractable Assessment & Plan:  New diagnosis, symptoms most consistent with common migraine, less likely tension headaches. Occuring now daily.  Tylenol  does help some but she is hesitant about  taking medications. Discussed keeping headache diary.  Working on lifestyle changes like increasing amount of sleep at night and hydration, working on stress reduction. There also may be an allergy trigger component so she will start Flonase  2 sprays per nostril daily. She is currently in 1 week of migraine.  Offered Toradol injection but she does not want to do this today.  Also discussed she has significant stomach upset with NSAIDs so cannot use ibuprofen  for pain. We discussed possibly starting Imitrex as a rescue medication but she is hesitant.  Information given on natural treatments for migraine  prevention. She will review triggers for migraine.  She will follow-up in 1 month if migraine is not improving as expected.  We can reassess possible need for migraine preventative.  Of note no red flags, normal neuroexam.  No indication for imaging at this time.      Return in about 4 weeks (around 10/07/2024) for  follow up new migraine diagnosis.   Greig Ring, MD

## 2024-09-09 NOTE — Assessment & Plan Note (Signed)
 Cerumen impaction removal via irrigation Performed by : Canter, Kaylyn, CMA  Consent for procedure obtained verbally. Bilateral ears lavaged/ irrigated with warm water gently. Pt tolerated procedure well, with no complications. After procedure ears clear of cerumen impaction, with minimal ear canal irritation and redness, no bleeding. TM intact and symptoms improved.

## 2024-09-09 NOTE — Assessment & Plan Note (Signed)
 New diagnosis, symptoms most consistent with common migraine, less likely tension headaches. Occuring now daily.  Tylenol  does help some but she is hesitant about taking medications. Discussed keeping headache diary.  Working on lifestyle changes like increasing amount of sleep at night and hydration, working on stress reduction. There also may be an allergy trigger component so she will start Flonase  2 sprays per nostril daily. She is currently in 1 week of migraine.  Offered Toradol injection but she does not want to do this today.  Also discussed she has significant stomach upset with NSAIDs so cannot use ibuprofen  for pain. We discussed possibly starting Imitrex as a rescue medication but she is hesitant.  Information given on natural treatments for migraine  prevention. She will review triggers for migraine.  She will follow-up in 1 month if migraine is not improving as expected.  We can reassess possible need for migraine preventative.  Of note no red flags, normal neuroexam.  No indication for imaging at this time.

## 2024-09-09 NOTE — Patient Instructions (Signed)
 Start headache diary.  Review triggers of migraine.  Work on stress reduction.  Can try flonase  2 spray per nostril daily.

## 2024-09-14 ENCOUNTER — Ambulatory Visit (INDEPENDENT_AMBULATORY_CARE_PROVIDER_SITE_OTHER): Admitting: Licensed Clinical Social Worker

## 2024-09-14 DIAGNOSIS — F4323 Adjustment disorder with mixed anxiety and depressed mood: Secondary | ICD-10-CM

## 2024-09-14 NOTE — Progress Notes (Signed)
 [facility] Counselor/Therapist Progress Note  Patient ID: Rebecca Rollins, MRN: [id number]    Date: 09/14/24  Time Spent: 12:16  pm - 1:19 pm : 63 Minutes  Treatment Type: Individual Therapy.  Reported Symptoms: Patient tearful and visibly distressed when discussing family dynamics. Affect anxious and fatigued. Insight present; judgment intact.  Mental Status Exam: Appearance:  Well Groomed     Behavior: Appropriate and Sharing  Motor: Normal  Speech/Language:  Normal Rate  Affect: Appropriate  Mood: anxious and sad  Thought process: goal directed  Thought content:   WNL  Sensory/Perceptual disturbances:   WNL  Orientation: oriented to person, place, and time/date  Attention: Good  Concentration: Good  Memory: WNL  Fund of knowledge:  Good  Insight:   Good  Judgment:  Good  Impulse Control: Good   Risk Assessment: Danger to Self:  No Self-injurious Behavior: No Danger to Others: No Duty to Warn:no Physical Aggression / Violence:No  Access to Firearms a concern: No  Gang Involvement:No   Subjective:   Rebecca Rollins participated from office, via video, and consented to treatment. I discussed the limitations of evaluation and management by telemedicine and the availability of in person appointments. The patient expressed understanding and agreed to proceed.  Therapist participated from home office.  Rebecca Rollins reviewed the events of the past week. Patient reports feeling mentally fatigued and drained due to ongoing conflict with her sister, with whom she recently signed a lease out of fear of not being able to independently care for herself and her daughter. Since moving in, she describes increased discord and the return of stress-related headaches, currently being managed by her PCP. Patient becomes tearful when discussing her sister, expressing fear and emotional exhaustion. She reports staying confined to her room when her sister's boyfriend visits frequently,  stating she feels unsafe and even sleeps with scissors under her pillow out of fear of her sister's anger. Patient acknowledges the environment is "toxic" and impacting her overall wellbeing. Family members are supportive of her distancing from her sister.     Interventions: Cognitive Behavioral Therapy, Solution-Oriented/Positive Psychology, and Insight-Oriented  Diagnosis:  Adjustment disorder with mixed anxiety and depressed mood  Psychiatric Treatment: No , N/A  Treatment Plan:  Client Abilities/Strengths Rebecca Rollins is open to sessions.    Support System: Family and Friends   Merchant [employer] Officer of Needs Rebecca Rollins would like to resume sessions and focus on a new goal.      Treatment Level Weekly  Symptoms  Tearful/   (Status: maintained) overwhelmed   (Status: declined)  Goals:   Rebecca Rollins Patient experiencing heightened emotional distress and anxiety related to an unsafe and toxic living environment with her sister. Physical symptoms (headaches, fatigue) exacerbated by stress. Demonstrates awareness of unhealthy coping through overworking. No suicidal ideation reported. Encourage patient to explore lease termination or alternative housing options. Reinforce safety planning and coping strategies for managing fear and conflict. Support reduction of workload to prevent further burnout and health decline. Continue collaboration with PCP for headache management. Monitor emotional safety, mood, and progress in setting boundaries and pursuing stability.   Target Date: 11/02/24 Frequency: Weekly  Progress: 0 Modality: individual    Therapist will provide referrals for additional resources as appropriate.  Therapist will provide psycho-education regarding Rebecca Rollins's diagnosis and corresponding treatment approaches and interventions. Licensed Clinical Mental Health Counselor, Tawni Louder, [facility] will support the patient's ability to achieve the goals  identified. will employ CBT, BA, Problem-solving, Solution Focused,  Mindfulness,  coping skills, & other evidenced-based practices will be used to promote progress towards healthy functioning to help manage decrease symptoms associated with her diagnosis.   Reduce overall level, frequency, and intensity of the feelings of depression, anxiety and panic evidenced by decreased burnout and fatigue from 6 to 7 days/week to 0 to 1 days/week per client report for at least 3 consecutive months. Verbally express understanding of the relationship between feelings of depression, anxiety and their impact on thinking patterns and behaviors. Verbalize an understanding of the role that distorted thinking plays in creating fears, excessive worry, and ruminations.  Rebecca Rollins participated in the creation of the treatment plan)   Charlann Wayne, LCMHC

## 2024-09-16 ENCOUNTER — Ambulatory Visit: Payer: Self-pay

## 2024-09-16 NOTE — Telephone Encounter (Signed)
 Call patient.  I am assuming she tried Flonase  2 sprays per nostril daily to help with headache.  Is she interested in a prescription for Imitrex?

## 2024-09-16 NOTE — Telephone Encounter (Signed)
 FYI Only or Action Required?: Action required by provider: clinical question for provider.  Patient was last seen in primary care on 09/09/2024 by Avelina Greig BRAVO, MD.  Called Nurse Triage reporting Headache.  Symptoms began a week ago.  Interventions attempted: OTC medications: Tylenol .  Symptoms are: gradually worsening.  Triage Disposition: See PCP When Office is Open (Within 3 Days)  Patient/caregiver understands and will follow disposition?: No, wishes to speak with PCP        Copied from CRM #[id number]. Topic: Clinical - Red Word Triage >> Sep 16, 2024 11:35 AM Adelita E wrote: [facility] that prompted transfer to Nurse Triage: Headache going on since 11/5, patient stated it is a throbbing pain and the comes and goes near the base of her neck.       Reason for Disposition  [1] MILD-MODERATE headache AND [2] present > 3 days (72 hours) AND [3] no improvement after using Care Advice  Answer Assessment - Initial Assessment Questions Patient declined an appointment stating she was seen for the same on 11/5. Patient would like to know if there is anything Dr. Danton would recommend for her headaches and wanted to see if she felt a referral to a neurologist would be needed. Please advise.      1. LOCATION: Where does it hurt?      Base of her neck  2. ONSET: When did the headache start? (e.g., minutes, hours, days)      7 days ago 3. PATTERN: Does the pain come and go, or has it been constant since it started?     Intermittent  4. SEVERITY: How bad is the pain? and What does it keep you from doing?  (e.g., Scale 1-10; mild, moderate, or severe)     Moderate 5. RECURRENT SYMPTOM: Have you ever had headaches before? If Yes, ask: When was the last time? and What happened that time?      Has been having similar headaches for a couple of months  6. CAUSE: What do you think is causing the headache?     Unsure 7. MIGRAINE: Have you been diagnosed with  migraine headaches? If Yes, ask: Is this headache similar?      No  8. HEAD INJURY: Has there been any recent injury to your head?      No 9. OTHER SYMPTOMS: Do you have any other symptoms? (e.g., fever, stiff neck, eye pain, sore throat, cold symptoms)     No 10. PREGNANCY: Is there any chance you are pregnant? When was your last menstrual period?       No  Protocols used: Headache-A-AH

## 2024-09-17 NOTE — Telephone Encounter (Signed)
 Spoke with Rebecca Rollins.  She totally forgot about the Flonase .  She will get that today and start.  She does not want the Imitrex.  She will touch base with us  early next week if the Flonase  does not help.  FYI to Dr. Avelina.

## 2024-09-21 ENCOUNTER — Ambulatory Visit: Admitting: Licensed Clinical Social Worker

## 2024-09-24 ENCOUNTER — Ambulatory Visit (INDEPENDENT_AMBULATORY_CARE_PROVIDER_SITE_OTHER): Admitting: Licensed Clinical Social Worker

## 2024-09-24 DIAGNOSIS — F4323 Adjustment disorder with mixed anxiety and depressed mood: Secondary | ICD-10-CM | POA: Diagnosis not present

## 2024-09-24 NOTE — Progress Notes (Signed)
 [facility] Counselor/Therapist Progress Note  Patient ID: Rebecca Rollins, MRN: [id number]    Date: 09/24/24  Time Spent: 11:01  am - 12:04 pm : 63 Minutes  Treatment Type: Individual Therapy.  Reported Symptoms: Patient appeared fatigued and distracted. Affect anxious but cooperative. Demonstrated insight into emotional triggers and interpersonal patterns. No acute safety concerns observed.  Mental Status Exam: Appearance:  Well Groomed     Behavior: Appropriate and Sharing  Motor: Normal  Speech/Language:  Normal Rate  Affect: Appropriate  Mood: normal  Thought process: normal  Thought content:   WNL  Sensory/Perceptual disturbances:   WNL  Orientation: oriented to person, place, and time/date  Attention: Good  Concentration: Good  Memory: WNL  Fund of knowledge:  Good  Insight:   Good  Judgment:  Good  Impulse Control: Good   Risk Assessment: Danger to Self:  No Self-injurious Behavior: No Danger to Others: No Duty to Warn:no Physical Aggression / Violence:No  Access to Firearms a concern: No  Gang Involvement:No   Subjective:   Rebecca Rollins participated from office, via video, and consented to treatment. I discussed the limitations of evaluation and management by telemedicine and the availability of in person appointments. The patient expressed understanding and agreed to proceed.  Therapist participated from home office.  Rebecca Rollins reviewed the events of the past week. 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 States that emotional tension at home has decreased somewhat since she and her sister reconciled last week, and the sister is now agreeable to having their mother visit for the holidays. Patient notes ongoing discomfort in the family dynamic and is "trying to keep the peace." Identifies common reactions to her sister's  provocations and acknowledges how rigid interpretations ("absolutes") dysregulate her nervous system. Continues to experience stress-related headaches and fatigue. Denies suicidal ideation.     Interventions: Assertiveness/Communication and Insight-Oriented  Diagnosis:  Adjustment disorder with mixed anxiety and depressed mood  Psychiatric Treatment: No , N/A  Treatment Plan:  Client Abilities/Strengths Rebecca Rollins is open to sessions.    Support System: Friends and Marketing Executive of Needs Rebecca Rollins would like to resume sessions and focus on a new goal.       Treatment Level Weekly  Symptoms  stressed   (Status: maintained) anxiousness   (Status: maintained)  Goals:   Rebecca Rollins Emotional distress related to chronic family conflict and unstable home environment, though acute tension has temporarily improved. Work-related stress remains high, contributing to burnout and somatic symptoms. Patient shows increased awareness of maladaptive coping (overworking) and is considering career change. Ongoing challenges with communication and boundary-setting with sister. Family systems issues affecting overall functioning. Continue exploring housing stability options and reinforce safety planning. Support reduction of workload and monitor for burnout and stress-related physical symptoms; continue collaboration with PCP for headache management. Provide communication tools for interactions with sister; address emotional reactivity and cognitive rigidity. Encourage exploration of a career transition.Recommend seeking a family systems therapist (external referral) for joint sessions with sister to improve relational functioning around shared caregiving dynamics. Monitor mood, emotional safety, and progress toward boundaries and stability in upcoming sessions.   Target Date: 11/02/24 Frequency: Weekly  Progress: 0 Modality: individual    Therapist will  provide referrals for additional resources as appropriate.  Therapist will provide psycho-education regarding Rebecca Rollins's diagnosis and corresponding treatment approaches and interventions. Licensed Clinical Mental  Health Counselor, Tawni Louder, [facility] will support the patient's ability to achieve the goals identified. will employ CBT, BA, Problem-solving, Solution Focused, Mindfulness,  coping skills, & other evidenced-based practices will be used to promote progress towards healthy functioning to help manage decrease symptoms associated with her diagnosis.   Reduce overall level, frequency, and intensity of the feelings of depression, anxiety and panic evidenced by decreased burnout and fatigue  from 6 to 7 days/week to 0 to 1 days/week per client report for at least 3 consecutive months. Verbally express understanding of the relationship between feelings of depression, anxiety and their impact on thinking patterns and behaviors. Verbalize an understanding of the role that distorted thinking plays in creating fears, excessive worry, and ruminations.  Rebecca Rollins participated in the creation of the treatment plan)   Lexander Tremblay, LCMHC

## 2024-09-28 ENCOUNTER — Ambulatory Visit (INDEPENDENT_AMBULATORY_CARE_PROVIDER_SITE_OTHER): Admitting: Licensed Clinical Social Worker

## 2024-09-28 DIAGNOSIS — F4323 Adjustment disorder with mixed anxiety and depressed mood: Secondary | ICD-10-CM | POA: Diagnosis not present

## 2024-09-28 NOTE — Progress Notes (Signed)
 [facility] Counselor/Therapist Progress Note  Patient ID: Rebecca Rollins, MRN: [id number]    Date: 09/28/24  Time Spent: 12:05  pm - 1:07 pm : 62 Minutes  Treatment Type: Individual Therapy.  Reported Symptoms: Patient open, reflective, and emotionally engaged. Mood stable; affect congruent. Demonstrates increasing insight and motivation for change.  Mental Status Exam: Appearance:  Well Groomed     Behavior: Appropriate and Sharing  Motor: Normal  Speech/Language:  Normal Rate  Affect: Appropriate  Mood: normal  Thought process: normal  Thought content:   WNL  Sensory/Perceptual disturbances:   WNL  Orientation: oriented to person, place, and time/date  Attention: Good  Concentration: Good  Memory: WNL  Fund of knowledge:  Good  Insight:   Good  Judgment:  Good  Impulse Control: Good   Risk Assessment: Danger to Self:  No Self-injurious Behavior: No Danger to Others: No Duty to Warn:no Physical Aggression / Violence:No  Access to Firearms a concern: No  Gang Involvement:No   Subjective:   Rebecca Rollins participated from community, via video, and consented to treatment. I discussed the limitations of evaluation and management by telemedicine and the availability of in person appointments. The patient expressed understanding and agreed to proceed.  Therapist participated from home office.  Eulah reviewed the events of the past week. Patient reports noticing herself slipping into old relational patterns with dating and men. States desire to make meaningful changes and remain committed to her spiritual values. Recognizes certain situations are distractions and acknowledges she is the common denominator who must choose differently to achieve the change she wants. Discussed assigning value to relationships and choices to better align with who she is and who she is becoming. Reports feeling deeply seen and understood in therapy, noting therapist's insight has been  helpful.     Interventions: Cognitive Behavioral Therapy and Assertiveness/Communication  Diagnosis:  Adjustment disorder with mixed anxiety and depressed mood  Psychiatric Treatment: No , N/A  Treatment Plan:  Client Abilities/Strengths Rebecca Rollins is open to sessions.    Support System: Family and Friends   [employer] Officer of Needs Rebecca Rollins would like to  resume sessions and focus on a new goal.        Treatment Level Weekly  Symptoms  reflective   (Status: maintained) Emotional regression/people pleasing   (Status: declined)  Goals:   Rebecca Rollins Patient actively exploring maladaptive relationship patterns and working to align behaviors with personal and spiritual values. Good insight and readiness for growth. 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  Assign journaling prompt: "I break my own heart when." Follow up next session.   Target Date: 11/02/24 Frequency: Weekly  Progress: 0 Modality: individual    Therapist will provide referrals for additional resources as appropriate.  Therapist will provide psycho-education regarding Rebecca Rollins's diagnosis and corresponding treatment approaches and interventions. Licensed Clinical Mental Health Counselor, Tawni Louder, [facility] will support the patient's ability to achieve the goals identified. will employ CBT, BA, Problem-solving, Solution Focused, Mindfulness,  coping skills, & other evidenced-based practices will be used to promote progress towards healthy functioning to help manage decrease symptoms associated with her diagnosis.   Reduce overall level, frequency, and intensity of the feelings of depression, anxiety and panic evidenced by decreased burnout and fatigue  from 6 to 7 days/week to 0 to 1 days/week per client report for at least 3 consecutive months. Verbally express understanding of the  relationship between  feelings of depression, anxiety and their impact on thinking patterns and behaviors. Verbalize an understanding of the role that distorted thinking plays in creating fears, excessive worry, and ruminations.  Rebecca Rollins participated in the creation of the treatment plan)   Rebecca Rollins, LCMHC

## 2024-10-08 ENCOUNTER — Ambulatory Visit: Admitting: Licensed Clinical Social Worker

## 2024-10-08 DIAGNOSIS — F4323 Adjustment disorder with mixed anxiety and depressed mood: Secondary | ICD-10-CM

## 2024-10-08 NOTE — Progress Notes (Signed)
 [facility] Counselor/Therapist Progress Note  Patient ID: Rebecca Rollins, MRN: [id number]    Date: 10/08/24  Time Spent: 9:01  am - 10:00 am : 59 Minutes  Treatment Type: Individual Therapy.  Reported Symptoms: Pt alert, oriented, engaged. Mood anxious but reflective; affect congruent. No SI/HI. Insight and motivation intact.  Mental Status Exam: Appearance:  Well Groomed     Behavior: Appropriate and Sharing  Motor: Normal  Speech/Language:  Normal Rate  Affect: Appropriate  Mood: normal  Thought process: normal  Thought content:   WNL  Sensory/Perceptual disturbances:   WNL  Orientation: oriented to person, place, and time/date  Attention: Good  Concentration: Good  Memory: WNL  Fund of knowledge:  Good  Insight:   Good  Judgment:  Good  Impulse Control: Good   Risk Assessment: Danger to Self:  No Self-injurious Behavior: No Danger to Others: No Duty to Warn:no Physical Aggression / Violence:No  Access to Firearms a concern: No  Gang Involvement:No   Subjective:   [name] participated from home, via video, and consented to treatment. I discussed the limitations of evaluation and management by telemedicine and the availability of in person appointments. The patient expressed understanding and agreed to proceed.  Therapist participated from home office.  Shelvie reviewed the events of the past week. Pt reports she enjoyed Thanksgiving with family despite initial disappointment when her mother planned not to attend due to conflict with pt's sister. Conflict resolved; family spent holiday and a follow-up breakfast together. Pt experiencing emotional waves related to last year's Christmas when mother did not give gifts to pt or sister. Pt also anxious that her sister may give expensive gifts and later use them in conflict. Pt reports fluctuating faith and feelings of inadequacy despite career stability.     Interventions: Insight-Oriented  Diagnosis:   Adjustment disorder with mixed anxiety and depressed mood   Psychiatric Treatment: No , N/A  Treatment Plan:  Client Abilities/Strengths Robbyn is open to sessions.    Support System: Family and Friends   [employer] Officer of Needs Delissa would like to utilize sessions to manage her daily stressors and increase her emotional intellegence and capacity for growth.    Treatment Level Weekly  Symptoms  optimistic   (Status: maintained) Consumed-finances and Christmas   (Status: declined)  Goals:   Danamarie Family-related stress and anticipatory anxiety around holidays. Ongoing difficulty with boundaries and fear of conflict with sister. Faith fluctuations contributing to feelings of inadequacy. Strengths include stable employment, parenting competence, and reflective capacity. Encourage pt to initiate transparent, separate conversations with mother and sister to clarify expectations and reduce holiday-related anxiety. Support acceptance of factors outside her control and continued reliance on coping strategies consistent with her faith. Assigned journaling: reflect on gratitude, early career fulfillment, and personal growth over last 5 years to explore sense of purpose and potential next steps. Continue weekly therapy to monitor mood, boundaries, and coping.  Target Date: 11/02/24 Frequency: Weekly  Progress: 0 Modality: individual    Therapist will provide referrals for additional resources as appropriate.  Therapist will provide psycho-education regarding Psalm's diagnosis and corresponding treatment approaches and interventions. Licensed Clinical Mental Health Counselor, Tawni Louder, [facility] will support the patient's ability to achieve the goals identified. will employ CBT, BA, Problem-solving, Solution Focused, Mindfulness,  coping skills, & other evidenced-based practices will be used to promote progress towards healthy functioning to  help manage decrease symptoms associated with her diagnosis.   Reduce  overall level, frequency, and intensity of the feelings of depression, anxiety and panic evidenced by decreased self abandonment/doubt  from 6 to 7 days/week to 0 to 1 days/week per client report for at least 3 consecutive months. Verbally express understanding of the relationship between feelings of depression, anxiety and their impact on thinking patterns and behaviors. Verbalize an understanding of the role that distorted thinking plays in creating fears, excessive worry, and ruminations.  [name] participated in the creation of the treatment plan)   Yukiko Minnich, LCMHC

## 2024-10-12 ENCOUNTER — Ambulatory Visit: Admitting: Licensed Clinical Social Worker

## 2024-10-12 ENCOUNTER — Encounter: Payer: Self-pay | Admitting: Licensed Clinical Social Worker

## 2024-10-12 DIAGNOSIS — F4323 Adjustment disorder with mixed anxiety and depressed mood: Secondary | ICD-10-CM

## 2024-10-12 NOTE — Progress Notes (Signed)
 [facility] Counselor/Therapist Progress Note  Patient ID: Rebecca Rollins, MRN: [id number]    Date: 10/12/24  Time Spent: 12:01  pm - 1:03 pm : 62 Minutes  Treatment Type: Individual Therapy.  Reported Symptoms: Pt engaged, reflective, and emotionally affected by workplace events. Affect subdued but stable. No SI/HI. Increased insight into patterns when prompted  Mental Status Exam: Appearance:  Casual     Behavior: Appropriate and Sharing  Motor: Normal  Speech/Language:  Normal Rate  Affect: Appropriate  Mood: normal  Thought process: normal  Thought content:   WNL  Sensory/Perceptual disturbances:   WNL  Orientation: oriented to person, place, and time/date  Attention: Good  Concentration: Good  Memory: WNL  Fund of knowledge:  Good  Insight:   Good  Judgment:  Good  Impulse Control: Good   Risk Assessment: Danger to Self:  No Self-injurious Behavior: No Danger to Others: No Duty to Warn:no Physical Aggression / Violence:No  Access to Firearms a concern: No  Gang Involvement:No   Subjective:   Rebecca Rollins participated from home, via video, and consented to treatment. I discussed the limitations of evaluation and management by telemedicine and the availability of in person appointments. The patient expressed understanding and agreed to proceed.  Therapist participated from home office.  Rebecca Rollins reviewed the events of the past week.      Interventions: Insight-Oriented  Diagnosis:  Adjustment disorder with mixed anxiety and depressed mood   Psychiatric Treatment: No , N/A  Treatment Plan:  Client Abilities/Strengths Rebecca Rollins is open to sessions.    Support System: Family and Friends   [employer] Officer of Needs Rebecca Rollins would like to  utilize sessions to manage her daily stressors and increase her emotional intellegence and capacity for growth.   Workplace conflict contributing to emotional distress and  continued pattern of avoidance in difficult interpersonal situations. Pt demonstrating growing insight into passive responses linked to self-esteem and emotional regulation challenges. Ready for deeper processing work. Pt to complete a "rage page" to process emotions around the workplace incident (and others as needed). Therapist advised balancing emotional release with self-care afterward (hydration, rest, grounding). Continue building skills in assertive communication and emotional regulation. Follow-up session scheduled in one week.  Treatment Level Weekly  Symptoms  Passive aggressive/frustrated   (Status: declined) reflective   (Status: improved)  Goals:   Rebecca Rollins Pt reports increased reflection since last session, expressing gratitude for her current job but acknowledging the "season may be up" as the joy is no longer present. 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 Pt is unsure how to proceed but is open to interventions.    Target Date: 11/02/24 Frequency: Weekly  Progress: 0 Modality: individual    Therapist will provide referrals for additional resources as appropriate.  Therapist will provide psycho-education regarding Rebecca Rollins's diagnosis and corresponding treatment approaches and interventions. Licensed Clinical Mental Health Counselor, Tawni Louder, [facility] will support the patient's ability to achieve the goals identified. will employ CBT, BA, Problem-solving, Solution Focused, Mindfulness,  coping skills, & other evidenced-based practices will be used to promote progress towards healthy functioning to help manage decrease symptoms associated with her diagnosis.   Reduce  overall level, frequency, and intensity of the  feelings of depression, anxiety and panic evidenced by decreased self abandonment/doubt  from 6 to 7 days/week to 0 to 1 days/week per client report for at least 3 consecutive months. Verbally express understanding of the relationship between feelings of depression, anxiety and their impact on thinking patterns and behaviors. Verbalize an understanding of the role that distorted thinking plays in creating fears, excessive worry, and ruminations.  Rebecca Rollins participated in the creation of the treatment plan)   Dolan Xia, LCMHC

## 2024-10-19 ENCOUNTER — Ambulatory Visit: Admitting: Licensed Clinical Social Worker

## 2024-10-19 DIAGNOSIS — F4323 Adjustment disorder with mixed anxiety and depressed mood: Secondary | ICD-10-CM

## 2024-10-19 NOTE — Progress Notes (Signed)
 [facility] Counselor/Therapist Progress Note  Patient ID: Rebecca Rollins, MRN: [id number]    Date: 10/19/2024  Time Spent: 12:04  pm - 1:11 pm : 67 Minutes  Treatment Type: Individual Therapy.  Reported Symptoms: Patient presented in active tears at session onset; affect depressed and anxious. Speech coherent, thought process logical. Insight emerging. No safety concerns reported.  Mental Status Exam: Appearance:  Well Groomed     Behavior: Appropriate and Sharing  Motor: Normal  Speech/Language:  Normal Rate  Affect: Tearful  Mood: anxious, irritable, and sad  Thought process: goal directed  Thought content:   WNL  Sensory/Perceptual disturbances:   WNL  Orientation: oriented to person, place, and time/date  Attention: Fair  Concentration: Fair  Memory: WNL  Fund of knowledge:  Fair  Insight:   Fair  Judgment:  Fair  Impulse Control: Fair   Risk Assessment: Danger to Self:  No Self-injurious Behavior: No Danger to Others: No Duty to Warn:no Physical Aggression / Violence:No  Access to Firearms a concern: No  Gang Involvement:No   Subjective:   Rebecca Rollins participated from home, via video, and consented to treatment. I discussed the limitations of evaluation and management by telemedicine and the availability of in person appointments. The patient expressed understanding and agreed to proceed.  Therapist participated from home office.  Rebecca Rollins reviewed the events of the past week. Patient reports increased depressive symptoms related to the holiday season, including feelings of inadequacy, low self-esteem, and financial stress. Patient stated distress over perceived inability to provide for herself and others, intensified by pressure surrounding Christmas expenses. Patient became tearful immediately at session start and reported acute emotional distress after committing to pay for family holiday photos, later learning the cost was $120, which she cannot  afford without missing a bill payment. Patient acknowledged difficulty declining the service or communicating financial limits to her sister despite recognizing overextension. Patient reports ongoing stress related to living with sister, noting this arrangement has contributed to relational strain and was previously discussed prior to a break in therapy. Patient expressed insight, stating she plans to move out with her young daughter and recognizes not honoring prior boundaries contributed to current stress.     Interventions: Solution-Oriented/Positive Psychology and Insight-Oriented  Diagnosis:  Adjustment disorder with mixed anxiety and depressed mood  Psychiatric Treatment: No , N/A  Treatment Plan:  Client Abilities/Strengths Rebecca Rollins is open to sessions.    Support System: Family and Friends   [employer] Officer of Needs Rebecca Rollins would like to utilize sessions to manage her daily stressors and increase her emotional intellegence and capacity for growth.    Treatment Level Weekly  Symptoms  overwhelmed   (Status: declined) sad   (Status: declined)  Goals:   Rebecca Rollins Increased depressive symptoms with low self-esteem and financial stressors. Ongoing difficulties with boundaries, overextending, and decision-making influenced by external pressure. Holiday-related stress exacerbating symptoms. Provided psychoeducation via video on self-esteem and worthiness. Assigned reflective journaling and activity-based homework related to video content. Continued focus on informed decision-making, boundary setting, and reducing overextension across wellness domains, including financial self-care. Next session scheduled for next week to assess progress and behavioral follow-through.   Target Date: 11/02/24 Frequency: Weekly  Progress: 0 Modality: individual    Therapist will provide referrals for additional resources as appropriate.  Therapist will provide  psycho-education regarding Rebecca Rollins's diagnosis and corresponding treatment approaches and interventions. Licensed Clinical Mental Health Counselor, Tawni Louder, [facility] will support the patient's ability to  achieve the goals identified. will employ CBT, BA, Problem-solving, Solution Focused, Mindfulness,  coping skills, & other evidenced-based practices will be used to promote progress towards healthy functioning to help manage decrease symptoms associated with her diagnosis.   Reduce overall level, frequency, and intensity of the feelings of depression, anxiety and panic evidenced by decreased self abandonment/doubt   from 6 to 7 days/week to 0 to 1 days/week per client report for at least 3 consecutive months. Verbally express understanding of the relationship between feelings of depression, anxiety and their impact on thinking patterns and behaviors. Verbalize an understanding of the role that distorted thinking plays in creating fears, excessive worry, and ruminations.  [name] participated in the creation of the treatment plan)   Lynzy Rawles, LCMHC

## 2024-10-26 ENCOUNTER — Ambulatory Visit (INDEPENDENT_AMBULATORY_CARE_PROVIDER_SITE_OTHER): Admitting: Licensed Clinical Social Worker

## 2024-10-26 DIAGNOSIS — F4323 Adjustment disorder with mixed anxiety and depressed mood: Secondary | ICD-10-CM

## 2024-10-26 NOTE — Progress Notes (Signed)
 [facility] Counselor/Therapist Progress Note  Patient ID: Rebecca Rollins, MRN: [id number]    Date: 10/26/2024  Time Spent: 12:02  pm - 12:58 pm : 56 Minutes  Treatment Type: Individual Therapy.  Reported Symptoms: Patient was emotionally engaged, reflective, and insight-oriented. Affect appropriate though tearful at times. Thought process coherent, with growing awareness of relational and behavioral patterns.  Mental Status Exam: Appearance:  Well Groomed     Behavior: Appropriate and Rationalizing  Motor: Normal  Speech/Language:  Normal Rate  Affect: Appropriate  Mood: normal  Thought process: normal  Thought content:   WNL  Sensory/Perceptual disturbances:   WNL  Orientation: oriented to person, place, and time/date  Attention: Good  Concentration: Good  Memory: WNL  Fund of knowledge:  Good  Insight:   Good  Judgment:  Good  Impulse Control: Good   Risk Assessment: Danger to Self:  No Self-injurious Behavior: No Danger to Others: No Duty to Warn:no Physical Aggression / Violence:No  Access to Firearms a concern: No  Gang Involvement:No   Subjective:   Rebecca Rollins participated from home, via video, and consented to treatment. I discussed the limitations of evaluation and management by telemedicine and the availability of in person appointments. The patient expressed understanding and agreed to proceed.  Therapist participated from home office.  Rebecca Rollins reviewed the events of the past week. Patient reported not honoring her original plan regarding purchasing expensive photos, acknowledging a pattern of becoming emotionally worked up and making fear-based decisions. She expressed excitement about the holidays alongside significant financial stress, noting finances were a primary reason for moving in with her sister, which has strained their relationship. Patient shared plans to honor herself by moving into her own apartment with her 104-year-old daughter in  April. She identified resentment toward her father as a root influence on how she relates to her daughters father, recognizing difficulty asking for what she wants. Patient described unresolved attachment issues related to her fathers absence, paternity conflict in childhood, and limited emotional engagement in adulthood.     Interventions: Assertiveness/Communication  Diagnosis:  Adjustment disorder with mixed anxiety and depressed mood   Psychiatric Treatment: No , N/A  Treatment Plan:  Client Abilities/Strengths Rebecca Rollins is open to sessions.    Support System: Family and Friends   [employer] Officer of Needs Rebecca Rollins would like to utilize sessions to manage her daily stressors and increase her emotional intellegence and capacity for growth.     Treatment Level Weekly  Symptoms  Financial stress   (Status: maintained) resentment   (Status: declined)  Goals:   Rebecca Rollins Ongoing anxiety and fear-based decision-making linked to financial stress and unresolved attachment wounds. Emerging insight into intergenerational and relational dynamics contributing to resentment and communication difficulties. Motivation for change and boundary-setting present. Continue exploring attachment history, resentment, and their impact on current relationships. Focus on emotional regulation, values-based decision-making, and practicing asking for needs directly. Support transition planning for independent housing. Continue therapy sessions as scheduled.   Target Date: 11/02/24 Frequency: Weekly  Progress: 0 Modality: individual    Therapist will provide referrals for additional resources as appropriate.  Therapist will provide psycho-education regarding Channelle's diagnosis and corresponding treatment approaches and interventions. Licensed Clinical Mental Health Counselor, Tawni Louder, [facility] will support the patient's ability to achieve the goals identified. will  employ CBT, BA, Problem-solving, Solution Focused, Mindfulness,  coping skills, & other evidenced-based practices will be used to promote progress towards healthy functioning to help manage decrease  symptoms associated with her diagnosis.   Reduce overall level, frequency, and intensity of the feelings of depression, anxiety and panic evidenced by decreased self abandonment/doubt  from 6 to 7 days/week to 0 to 1 days/week per client report for at least 3 consecutive months. Verbally express understanding of the relationship between feelings of depression, anxiety and their impact on thinking patterns and behaviors. Verbalize an understanding of the role that distorted thinking plays in creating fears, excessive worry, and ruminations.  Rebecca Rollins participated in the creation of the treatment plan)   Rebecca Rollins, LCMHC

## 2024-11-02 ENCOUNTER — Ambulatory Visit: Admitting: Licensed Clinical Social Worker

## 2024-11-02 ENCOUNTER — Encounter: Payer: Self-pay | Admitting: Licensed Clinical Social Worker

## 2024-11-02 DIAGNOSIS — F4323 Adjustment disorder with mixed anxiety and depressed mood: Secondary | ICD-10-CM | POA: Diagnosis not present

## 2024-11-02 NOTE — Progress Notes (Signed)
 " [facility] Counselor/Therapist Progress Note  Patient ID: Rebecca Rollins, MRN: [id number]    Date: 11/02/2024  Time Spent: 12:03  pm - 1:03 pm : 60 Minutes  Treatment Type: Individual Therapy.  Reported Symptoms: Patient emotionally charged but engaged and reflective. Affect labile; mood anxious and mildly depressed. Thought process coherent with insight into relational patterns and avoidance behaviors. No safety concerns observed.  Mental Status Exam: Appearance:  Casual     Behavior: Appropriate and Sharing  Motor: Normal  Speech/Language:  Normal Rate  Affect: Appropriate  Mood: normal  Thought process: normal  Thought content:   WNL  Sensory/Perceptual disturbances:   WNL  Orientation: oriented to person, place, and time/date  Attention: Good  Concentration: Good  Memory: WNL  Fund of knowledge:  Good  Insight:   Good  Judgment:  Good  Impulse Control: Good   Risk Assessment: Danger to Self:  No Self-injurious Behavior: No Danger to Others: No Duty to Warn:no Physical Aggression / Violence:No  Access to Firearms a concern: No  Gang Involvement:No   Subjective:   [name] participated from home, via video, and consented to treatment. I discussed the limitations of evaluation and management by telemedicine and the availability of in person appointments. The patient expressed understanding and agreed to proceed.  Therapist participated from home office.  Eneida reviewed the events of the past week. Patient reports relief that the holiday season has ended, citing increased emotional stress and financial frustration. Expressed sadness and disappointment regarding father-daughter relationship, noting feeling slighted by fathers expectations and difficulty forming a healthy adult dynamic following early estrangement. Reported tendency to overextend herself for others, compare current experiences to past ones, and struggle with present-moment gratitude.  Acknowledged inconsistency with self-care and follow-through outside of sessions. Expressed desire for increased accountability and greater integration of faith as a consistent support.    Interventions: Solution-Oriented/Positive Psychology and Insight-Oriented  Diagnosis:  Adj with mixed anx and dep Psychiatric Treatment: No , N/A  Treatment Plan:  Client Abilities/Strengths Denym is open to sessions.    Support System: Family and Friends   [employer] Officer of Needs Jaslynn would like to utilize sessions to manage her daily stressors and increase her emotional intellegence and capacity for growth.      Treatment Level Weekly  Symptoms  Difficulty with boundary setting and self-prioritization   (Status: declined) Emotional distress and anxiety related to family dynamics and unmet expectations   (Status: declined)  Goals:   Teletha Patient experiencing adjustment-related emotional distress related to family dynamics, boundaries, and unmet expectations. Ongoing difficulty with assertiveness, self-prioritization, and follow-through with coping practices. Strengths include insight, motivation for change, and openness to incorporating faith-based support. Readiness for structured goal work emerging. Provided validation and relational psychoeducation. Collaboratively established new treatment goal focused on assertive communication and boundary setting. Discussed accountability strategies and gradual incorporation of faith-based practices. Goal-directed work to begin next session. New Goal Over the next 8 weeks, the patient will increase her ability to assertively express needs, preferences, and boundaries by communicating at least one clear want or need per week in a relevant relationship (e.g., family, peers, or work) using appropriate assertive communication skills, while also demonstrating increased follow-through with self-care and faith-based  support practices.   Target Date: 01/04/25 Frequency: Weekly  Progress: 0 Modality: individual    Therapist will provide referrals for additional resources as appropriate.  Therapist will provide psycho-education regarding Floree's diagnosis and corresponding treatment approaches  and interventions. Licensed Clinical Mental Health Counselor, Tawni Louder, [facility] will support the patient's ability to achieve the goals identified. will employ CBT, BA, Problem-solving, Solution Focused, Mindfulness,  coping skills, & other evidenced-based practices will be used to promote progress towards healthy functioning to help manage decrease symptoms associated with her diagnosis.   Reduce overall level, frequency, and intensity of the feelings of depression, anxiety and panic evidenced by decreased self abandonment/doubt  from 6 to 7 days/week to 0 to 1 days/week per client report for at least 3 consecutive months. Verbally express understanding of the relationship between feelings of depression, anxiety and their impact on thinking patterns and behaviors. Verbalize an understanding of the role that distorted thinking plays in creating fears, excessive worry, and ruminations.  [name] participated in the creation of the treatment plan)   Starlit Raburn, [facility]    "

## 2024-11-19 ENCOUNTER — Ambulatory Visit: Admitting: Licensed Clinical Social Worker

## 2024-11-20 ENCOUNTER — Ambulatory Visit: Payer: Self-pay

## 2024-11-20 NOTE — Telephone Encounter (Signed)
 FYI Only or Action Required?: FYI only for provider: appointment scheduled on 11/24/24.  Patient was last seen in primary care on 09/09/2024 by Avelina Greig BRAVO, MD.  Called Nurse Triage reporting Back Pain.  Symptoms began today.  Interventions attempted: Nothing.  Symptoms are: unchanged.  Triage Disposition: See PCP When Office is Open (Within 3 Days)  Patient/caregiver understands and will follow disposition?: Yes, but will wait  Reason for Disposition  [1] Pain radiates into the thigh or further down the leg AND [2] one leg  Answer Assessment - Initial Assessment Questions Patient called to schedule an appointment for moderate-severe lower back and hip pain. She reports she has chronic intermittent low back pain, today when bent over taking clothes out of dryer she developed low back and hip pain 7-8/10, she has not tried any medications or treatments at this time, plans to take Tylenol  after this call.  Offered acute visit today at [facility] clinic but she is unavailable and requesting to schedule on Tuesday, Jan 20. Scheduled with pcp on 11/24/24. Patient will call back if pain is worsening or if she develops new symptoms.    1. ONSET: When did the pain begin? (e.g., minutes, hours, days)     Today 2. LOCATION: Where does it hurt? (upper, mid or lower back)     Lower back and hips 3. SEVERITY: How bad is the pain?  (e.g., Scale 1-10; mild, moderate, or severe)    7- 8/10 Moderate-throbbing 4. PATTERN: Is the pain constant? (e.g., yes, no; constant, intermittent)      Constant 5. RADIATION: Does the pain shoot into your legs or somewhere else?      Denies  6. CAUSE:  What do you think is causing the back pain?      Unsure. She bent down to put clothes in drier and developed instant pain 7. BACK OVERUSE:  Any recent lifting of heavy objects, strenuous work or exercise?     No bending  8. MEDICINES: What have you taken so far for the pain? (e.g., nothing,  acetaminophen , NSAIDS)     no 9. NEUROLOGIC SYMPTOMS: Do you have any weakness, numbness, or problems with bowel/bladder control?     denies 10. OTHER SYMPTOMS: Do you have any other symptoms? (e.g., fever, abdomen pain, burning with urination, blood in urine)     History of intermittent low back pain.  Protocols used: Back Pain-A-AH  Message from Alfonso HERO sent at 11/20/2024  9:34 AM EST  Reason for Triage: patient woke up can barely move due to lower back pain

## 2024-11-20 NOTE — Telephone Encounter (Signed)
 Next Appt With Family Medicine Darra Ring, MD) 11/24/2024 at 3:40 PM

## 2024-11-24 ENCOUNTER — Ambulatory Visit: Admitting: Licensed Clinical Social Worker

## 2024-11-24 ENCOUNTER — Ambulatory Visit: Admitting: Family Medicine

## 2024-11-26 ENCOUNTER — Encounter: Payer: Self-pay | Admitting: Family Medicine

## 2024-11-26 ENCOUNTER — Ambulatory Visit: Admitting: Family Medicine

## 2024-11-26 VITALS — BP 124/74 | HR 86 | Temp 98.1°F | Ht 62.0 in | Wt 207.2 lb

## 2024-11-26 DIAGNOSIS — M545 Low back pain, unspecified: Secondary | ICD-10-CM | POA: Diagnosis not present

## 2024-11-26 MED ORDER — MELOXICAM 7.5 MG PO TABS: 7.5000 mg | ORAL_TABLET | Freq: Every day | ORAL | 0 refills | Status: AC

## 2024-11-26 MED ORDER — CYCLOBENZAPRINE HCL 10 MG PO TABS: 10.0000 mg | ORAL_TABLET | Freq: Every day | ORAL | 0 refills | Status: AC

## 2024-11-26 NOTE — Progress Notes (Signed)
 "   Patient ID: Rebecca Rollins, female    DOB: [birth year], 34 y.o.   MRN: [id number]  This visit was conducted in person.  BP 124/74 (BP Location: Right Arm, Patient Position: Sitting, Cuff Size: Normal)   Pulse 86   Temp 98.1 F (36.7 C) (Oral)   Ht 5' 2 (1.575 m)   Wt 207 lb 3.2 oz (94 kg)   LMP 11/09/2024 (Exact Date)   SpO2 99%   BMI 37.90 kg/m    CC:  Chief Complaint  Patient presents with   Acute Visit    Pt has chronic intermittent low back pain, on 1/16 when bent over she felt pain low back, later on that she felt the pain radiating down to hips and top of thighs, it was hurting pt to walk or sit, now pt feels pain more so with heavy movement or laying on back     Subjective:   HPI: Rebecca Rollins is a 34 y.o. female presenting on 11/26/2024 for Acute Visit (Pt has chronic intermittent low back pain, on 1/16 when bent over she felt pain low back, later on that she felt the pain radiating down to hips and top of thighs, it was hurting pt to walk or sit, now pt feels pain more so with heavy movement or laying on back/)   Patient with history of chronic intermittent low back pain now with acute flare starting January 16 after bending over.  Patient felt a sudden sharp pain in her  midline low back and later that day she noted pain radiating down to bilateral hips and the tops of her thighs.  Pain in back woke her up at night.  No new numbness, no weakness.  No fever.  No incontinence.   She has tried tylenol , muscle relaxant (not sure which one, maybe methocarbamol)    She has chronic neck and upper back pain as well given large breasts.     Relevant past medical, surgical, family and social history reviewed and updated as indicated. Interim medical history since our last visit reviewed. Allergies and medications reviewed and updated. Outpatient Medications Prior to Visit  Medication Sig Dispense Refill   pantoprazole  (PROTONIX ) 40 MG tablet Take 1 tablet (40 mg  total) by mouth daily. (Patient not taking: Reported on 11/26/2024) 30 tablet 1   triamcinolone  cream (KENALOG ) 0.1 % Apply 1 Application topically 2 (two) times daily. (Patient not taking: Reported on 11/26/2024) 30 g 0   valACYclovir  (VALTREX ) 1000 MG tablet Take 1 tablet (1,000 mg total) by mouth daily as needed. (Patient not taking: Reported on 11/26/2024) 30 tablet 0   venlafaxine  XR (EFFEXOR  XR) 75 MG 24 hr capsule Take 1 capsule (75 mg total) by mouth daily with breakfast. (Patient not taking: Reported on 11/26/2024) 90 capsule 1   No facility-administered medications prior to visit.     Per HPI unless specifically indicated in ROS section below Review of Systems  Constitutional:  Negative for fatigue and fever.  HENT:  Negative for congestion.   Eyes:  Negative for pain.  Respiratory:  Negative for cough and shortness of breath.   Cardiovascular:  Negative for chest pain, palpitations and leg swelling.  Gastrointestinal:  Negative for abdominal pain.  Genitourinary:  Negative for dysuria and vaginal bleeding.  Musculoskeletal:  Positive for back pain.  Neurological:  Negative for syncope, light-headedness and headaches.  Psychiatric/Behavioral:  Negative for dysphoric mood.    Objective:  BP 124/74 (BP Location: Right Arm, Patient Position:  Sitting, Cuff Size: Normal)   Pulse 86   Temp 98.1 F (36.7 C) (Oral)   Ht 5' 2 (1.575 m)   Wt 207 lb 3.2 oz (94 kg)   LMP 11/09/2024 (Exact Date)   SpO2 99%   BMI 37.90 kg/m   Wt Readings from Last 3 Encounters:  11/26/24 207 lb 3.2 oz (94 kg)  09/09/24 201 lb 2 oz (91.2 kg)  01/23/24 195 lb 2 oz (88.5 kg)      Physical Exam Constitutional:      General: She is not in acute distress.    Appearance: Normal appearance. She is well-developed. She is not ill-appearing or toxic-appearing.  HENT:     Head: Normocephalic.     Right Ear: Hearing, tympanic membrane, ear canal and external ear normal. Tympanic membrane is not erythematous,  retracted or bulging.     Left Ear: Hearing, tympanic membrane, ear canal and external ear normal. Tympanic membrane is not erythematous, retracted or bulging.     Nose: No mucosal edema or rhinorrhea.     Right Sinus: No maxillary sinus tenderness or frontal sinus tenderness.     Left Sinus: No maxillary sinus tenderness or frontal sinus tenderness.     Mouth/Throat:     Pharynx: Uvula midline.  Eyes:     General: Lids are normal. Lids are everted, no foreign bodies appreciated.     Conjunctiva/sclera: Conjunctivae normal.     Pupils: Pupils are equal, round, and reactive to light.  Neck:     Thyroid : No thyroid  mass or thyromegaly.     Vascular: No carotid bruit.     Trachea: Trachea normal.  Cardiovascular:     Rate and Rhythm: Normal rate and regular rhythm.     Pulses: Normal pulses.     Heart sounds: Normal heart sounds, S1 normal and S2 normal. No murmur heard.    No friction rub. No gallop.  Pulmonary:     Effort: Pulmonary effort is normal. No tachypnea or respiratory distress.     Breath sounds: Normal breath sounds. No decreased breath sounds, wheezing, rhonchi or rales.  Abdominal:     General: Bowel sounds are normal.     Palpations: Abdomen is soft.     Tenderness: There is no abdominal tenderness.  Musculoskeletal:     Cervical back: Normal range of motion and neck supple.     Lumbar back: Spasms and tenderness present. No swelling or bony tenderness. Normal range of motion. Positive left straight leg raise test. Negative right straight leg raise test.  Skin:    General: Skin is warm and dry.     Findings: No rash.  Neurological:     Mental Status: She is alert.  Psychiatric:        Mood and Affect: Mood is not anxious or depressed.        Speech: Speech normal.        Behavior: Behavior normal. Behavior is cooperative.        Thought Content: Thought content normal.        Judgment: Judgment normal.       Results for orders placed or performed in visit on  01/23/24  CBC with Differential/Platelet   Collection Time: 01/23/24  2:33 PM  Result Value Ref Range   WBC 5.2 4.0 - 10.5 K/uL   RBC 4.30 3.87 - 5.11 Mil/uL   Hemoglobin 11.4 (L) 12.0 - 15.0 g/dL   HCT 64.5 (L) 63.9 - 53.9 %   MCV  82.3 78.0 - 100.0 fl   MCHC 32.3 30.0 - 36.0 g/dL   RDW 82.7 (H) 88.4 - 84.4 %   Platelets 321.0 150.0 - 400.0 K/uL   Neutrophils Relative % 46.4 43.0 - 77.0 %   Lymphocytes Relative 39.1 12.0 - 46.0 %   Monocytes Relative 10.2 3.0 - 12.0 %   Eosinophils Relative 3.6 0.0 - 5.0 %   Basophils Relative 0.7 0.0 - 3.0 %   Neutro Abs 2.4 1.4 - 7.7 K/uL   Lymphs Abs 2.0 0.7 - 4.0 K/uL   Monocytes Absolute 0.5 0.1 - 1.0 K/uL   Eosinophils Absolute 0.2 0.0 - 0.7 K/uL   Basophils Absolute 0.0 0.0 - 0.1 K/uL  IBC + Ferritin   Collection Time: 01/23/24  2:33 PM  Result Value Ref Range   Iron  39 (L) 42 - 145 ug/dL   Transferrin 719.9 787.9 - 360.0 mg/dL   Saturation Ratios 9.9 (L) 20.0 - 50.0 %   Ferritin 17.2 10.0 - 291.0 ng/mL   TIBC 392.0 250.0 - 450.0 mcg/dL  Comprehensive metabolic panel   Collection Time: 01/23/24  2:33 PM  Result Value Ref Range   Sodium 138 135 - 145 mEq/L   Potassium 3.9 3.5 - 5.1 mEq/L   Chloride 102 96 - 112 mEq/L   CO2 28 19 - 32 mEq/L   Glucose, Bld 68 (L) 70 - 99 mg/dL   BUN 6 6 - 23 mg/dL   Creatinine, Ser 9.18 0.40 - 1.20 mg/dL   Total Bilirubin 0.3 0.2 - 1.2 mg/dL   Alkaline Phosphatase 76 39 - 117 U/L   AST 15 0 - 37 U/L   ALT 10 0 - 35 U/L   Total Protein 7.2 6.0 - 8.3 g/dL   Albumin 4.1 3.5 - 5.2 g/dL   GFR 03.09 >39.99 mL/min   Calcium 9.2 8.4 - 10.5 mg/dL  Vitamin A87   Collection Time: 01/23/24  2:33 PM  Result Value Ref Range   Vitamin B-12 225 211 - 911 pg/mL  TSH   Collection Time: 01/23/24  2:33 PM  Result Value Ref Range   TSH 0.54 0.35 - 5.50 uIU/mL    Assessment and Plan  Acute midline low back pain without sciatica Assessment & Plan:  Acute, no focal vertebral tenderness to palpation.  No  indication for imaging. Most likely musculoskeletal strain in lumbar spine. Will treat with heat, start home physical therapy and start anti-inflammatory.  Chose meloxicam  given some amount of side effect of stomach upset with ibuprofen .  Can try of 7.5 mg daily and increase to 15 mg if tolerated but ineffective. Treat with muscle relaxant at night.  Return and ER precautions provided.   Other orders -     Cyclobenzaprine  HCl; Take 1 tablet (10 mg total) by mouth at bedtime.  Dispense: 15 tablet; Refill: 0 -     Meloxicam ; Take 1-2 tablets (7.5-15 mg total) by mouth daily.  Dispense: 30 tablet; Refill: 0    Return if symptoms worsen or fail to improve.   Greig Ring, MD  "

## 2024-11-26 NOTE — Assessment & Plan Note (Signed)
"   Acute, no focal vertebral tenderness to palpation.  No indication for imaging. Most likely musculoskeletal strain in lumbar spine. Will treat with heat, start home physical therapy and start anti-inflammatory.  Chose meloxicam  given some amount of side effect of stomach upset with ibuprofen .  Can try of 7.5 mg daily and increase to 15 mg if tolerated but ineffective. Treat with muscle relaxant at night.  Return and ER precautions provided. "

## 2024-12-03 ENCOUNTER — Ambulatory Visit: Admitting: Licensed Clinical Social Worker

## 2024-12-07 ENCOUNTER — Encounter: Payer: Self-pay | Admitting: Licensed Clinical Social Worker

## 2024-12-07 ENCOUNTER — Ambulatory Visit: Admitting: Licensed Clinical Social Worker

## 2024-12-07 DIAGNOSIS — F4323 Adjustment disorder with mixed anxiety and depressed mood: Secondary | ICD-10-CM | POA: Diagnosis not present

## 2024-12-07 DIAGNOSIS — F331 Major depressive disorder, recurrent, moderate: Secondary | ICD-10-CM

## 2024-12-14 ENCOUNTER — Ambulatory Visit: Admitting: Licensed Clinical Social Worker

## 2024-12-21 ENCOUNTER — Ambulatory Visit: Admitting: Licensed Clinical Social Worker

## 2024-12-28 ENCOUNTER — Ambulatory Visit: Admitting: Licensed Clinical Social Worker
